# Patient Record
Sex: Female | Born: 1935
Health system: Southern US, Community
[De-identification: ages and names within clinical notes are randomized; demographics above are authoritative.]

## PROBLEM LIST (undated history)

## (undated) DIAGNOSIS — E785 Hyperlipidemia, unspecified: Secondary | ICD-10-CM

## (undated) DIAGNOSIS — Z8614 Personal history of Methicillin resistant Staphylococcus aureus infection: Secondary | ICD-10-CM

## (undated) DIAGNOSIS — N3281 Overactive bladder: Secondary | ICD-10-CM

## (undated) DIAGNOSIS — Z860100 Personal history of colon polyps, unspecified: Secondary | ICD-10-CM

## (undated) DIAGNOSIS — R0602 Shortness of breath: Secondary | ICD-10-CM

## (undated) DIAGNOSIS — M1990 Unspecified osteoarthritis, unspecified site: Secondary | ICD-10-CM

## (undated) DIAGNOSIS — IMO0002 Reserved for concepts with insufficient information to code with codable children: Secondary | ICD-10-CM

## (undated) DIAGNOSIS — M792 Neuralgia and neuritis, unspecified: Secondary | ICD-10-CM

## (undated) DIAGNOSIS — I10 Essential (primary) hypertension: Secondary | ICD-10-CM

## (undated) DIAGNOSIS — F0390 Unspecified dementia without behavioral disturbance: Secondary | ICD-10-CM

## (undated) DIAGNOSIS — F99 Mental disorder, not otherwise specified: Secondary | ICD-10-CM

## (undated) HISTORY — DX: Personal history of Methicillin resistant Staphylococcus aureus infection: Z86.14

## (undated) HISTORY — DX: Hyperlipidemia, unspecified: E78.5

## (undated) HISTORY — DX: Unspecified osteoarthritis, unspecified site: M19.90

## (undated) HISTORY — PX: EYE SURGERY: SHX253

## (undated) HISTORY — DX: Personal history of colon polyps, unspecified: Z86.0100

## (undated) HISTORY — PX: ABDOMINAL HYSTERECTOMY: SHX81

## (undated) HISTORY — DX: Essential (primary) hypertension: I10

## (undated) HISTORY — DX: Personal history of colonic polyps: Z86.010

## (undated) HISTORY — DX: Reserved for concepts with insufficient information to code with codable children: IMO0002

---

## 1998-05-31 ENCOUNTER — Ambulatory Visit (HOSPITAL_COMMUNITY): Admission: RE | Admit: 1998-05-31 | Discharge: 1998-05-31 | Payer: Self-pay | Admitting: Family Medicine

## 1998-06-28 ENCOUNTER — Encounter: Admission: RE | Admit: 1998-06-28 | Discharge: 1998-09-26 | Payer: Self-pay | Admitting: Family Medicine

## 2000-10-23 LAB — HM PAP SMEAR: HM Pap smear: NEGATIVE

## 2002-07-27 ENCOUNTER — Encounter (INDEPENDENT_AMBULATORY_CARE_PROVIDER_SITE_OTHER): Payer: Self-pay | Admitting: Specialist

## 2002-07-27 ENCOUNTER — Ambulatory Visit (HOSPITAL_COMMUNITY): Admission: RE | Admit: 2002-07-27 | Discharge: 2002-07-27 | Payer: Self-pay | Admitting: Gastroenterology

## 2002-07-27 LAB — HM COLONOSCOPY: HM Colonoscopy: NEGATIVE

## 2003-07-26 ENCOUNTER — Encounter: Admission: RE | Admit: 2003-07-26 | Discharge: 2003-10-24 | Payer: Self-pay | Admitting: Family Medicine

## 2005-03-10 HISTORY — PX: VAGINAL PROLAPSE REPAIR: SHX830

## 2005-08-15 ENCOUNTER — Ambulatory Visit: Payer: Self-pay | Admitting: Family Medicine

## 2005-10-27 ENCOUNTER — Inpatient Hospital Stay (HOSPITAL_COMMUNITY): Admission: RE | Admit: 2005-10-27 | Discharge: 2005-10-29 | Payer: Self-pay | Admitting: Obstetrics and Gynecology

## 2006-02-18 ENCOUNTER — Ambulatory Visit: Payer: Self-pay | Admitting: Family Medicine

## 2006-03-11 ENCOUNTER — Ambulatory Visit: Payer: Self-pay | Admitting: Family Medicine

## 2006-09-07 ENCOUNTER — Ambulatory Visit: Payer: Self-pay | Admitting: Family Medicine

## 2007-03-23 ENCOUNTER — Ambulatory Visit: Payer: Self-pay | Admitting: Family Medicine

## 2008-04-21 ENCOUNTER — Ambulatory Visit: Payer: Self-pay | Admitting: Family Medicine

## 2008-05-08 LAB — HM MAMMOGRAPHY

## 2008-05-12 ENCOUNTER — Ambulatory Visit: Payer: Self-pay | Admitting: Family Medicine

## 2008-06-12 ENCOUNTER — Ambulatory Visit: Payer: Self-pay | Admitting: Family Medicine

## 2008-09-18 ENCOUNTER — Ambulatory Visit: Payer: Self-pay | Admitting: Family Medicine

## 2008-12-25 ENCOUNTER — Ambulatory Visit: Payer: Self-pay | Admitting: Family Medicine

## 2009-01-04 ENCOUNTER — Ambulatory Visit: Payer: Self-pay | Admitting: Family Medicine

## 2009-01-22 ENCOUNTER — Ambulatory Visit: Payer: Self-pay | Admitting: Family Medicine

## 2009-04-30 ENCOUNTER — Ambulatory Visit: Payer: Self-pay | Admitting: Family Medicine

## 2009-08-27 ENCOUNTER — Ambulatory Visit: Payer: Self-pay | Admitting: Family Medicine

## 2009-08-31 ENCOUNTER — Ambulatory Visit: Payer: Self-pay | Admitting: Family Medicine

## 2009-09-03 ENCOUNTER — Ambulatory Visit: Payer: Self-pay | Admitting: Family Medicine

## 2009-09-12 ENCOUNTER — Ambulatory Visit: Payer: Self-pay | Admitting: Family Medicine

## 2010-06-28 ENCOUNTER — Encounter (INDEPENDENT_AMBULATORY_CARE_PROVIDER_SITE_OTHER): Payer: Medicare Other | Admitting: Family Medicine

## 2010-06-28 DIAGNOSIS — H3500 Unspecified background retinopathy: Secondary | ICD-10-CM

## 2010-06-28 DIAGNOSIS — Z0000 Encounter for general adult medical examination without abnormal findings: Secondary | ICD-10-CM

## 2010-06-28 DIAGNOSIS — E119 Type 2 diabetes mellitus without complications: Secondary | ICD-10-CM

## 2010-06-28 DIAGNOSIS — I10 Essential (primary) hypertension: Secondary | ICD-10-CM

## 2010-07-26 NOTE — Op Note (Signed)
NAME:  Kristin Cook, Kristin Cook                        ACCOUNT NO.:  [id number]   MEDICAL RECORD NO.:  [id number]                   PATIENT TYPE:  AMB   LOCATION:  ENDO                                 FACILITY:  [facility]   PHYSICIAN:  Anselmo Rod, M.D.               DATE OF BIRTH:  12-28-1931   DATE OF PROCEDURE:  07/27/2002  DATE OF DISCHARGE:                                 OPERATIVE REPORT   PROCEDURE:  Colonoscopy with biopsy.   ENDOSCOPIST:  Anselmo Rod, M.D.   INSTRUMENT USED:  Olympus video colonoscope.   INDICATIONS FOR PROCEDURE:  A 77 year old white female undergoing screening  colonoscopy to rule out colonic polyps, masses, etc.   PREPROCEDURE PREPARATION:  Informed consent was procured from the patient.  The patient fasted for eight hours prior to the procedure and prepped with a  bottle of magnesium citrate and a gallon of GoLYTELY the night prior to the  procedure.   PREPROCEDURE PHYSICAL:  The patient had stable vital signs. Neck supple.  Chest clear to auscultation. S1, S2 regular. Abdomen soft with normal bowel  sounds.   DESCRIPTION OF PROCEDURE:  The patient was placed in the left lateral  decubitus position and sedated with 40 mg of Demerol and 4 mg of Versed  intravenously. Once the patient was adequately sedated and maintained on low  flow oxygen and continuous cardiac monitoring, the Olympus video colonoscope  was advanced from the rectum to the cecum without difficulty.  A few  hyperplastic appearing small sessile polyps were seen in the rectum, these  were biopsied for pathology. The rest of the colonic mucosa up to the cecum  appeared normal. There was some residual stool in the colon, multiple  washings were done, small lesions could have been missed. There was no  evidence of diverticulosis. The patient tolerated the procedure well without  complications.   IMPRESSION:  1. Small sessile polyps biopsied from rectum, question of hyperplastic  polyps.  2. Some residual stool in the colon especially on the right side, small     lesions could have been missed.  3. No masses, polyp or diverticulosis noted.   RECOMMENDATIONS:  1. Await pathology results.  2. High fiber diet with liberal fluid intake has been advised.  3.     20-25 g of fiber has been recommended in the diet.  4. Repeat colorectal cancer screening depending on the pathology results.  5. Outpatient followup as needed.                                               Anselmo Rod, M.D.    JNM/MEDQ  D:  07/27/2002  T:  07/27/2002  Job:  [id number]   cc:   Sharlot Gowda, M.D.  [address]  [place], [place] [address]  Fax: [phone]

## 2010-07-26 NOTE — Discharge Summary (Signed)
Kristin Cook, Kristin Cook              ACCOUNT NO.:  [id number]   MEDICAL RECORD NO.:  [id number]          PATIENT TYPE:  INP   LOCATION:  9320                          [facility]ACILITY:  [facility]   PHYSICIAN:  Guy Sandifer. Henderson Cloud, M.D. DATE OF BIRTH:  09-29-1931   DATE OF ADMISSION:  10/27/2005  DATE OF DISCHARGE:  10/29/2005                                 DISCHARGE SUMMARY   ADMITTING DIAGNOSIS:  Pelvic relaxation.   DISCHARGE DIAGNOSIS:  Pelvic relaxation.   PROCEDURE:  On 10/27/05 anterior vaginal repair with Perigee graft and  posterior vaginal repair with Apogee graft.   REASON FOR ADMISSION:  This patient is a 77 year old married white female  G3, P3 who complains of symptoms of pelvic relaxation.  Details are dictated  in the history and physical.  She is admitted for surgical management.   HOSPITAL COURSE:  The patient is taken to operating room, undergoes the  above procedure.  On the evening of surgery she has good pain relief.  She  has tolerated some bites of a regular diet.  Vital signs are stable and she  is afebrile.  Urine output is clear.  Capillary glucose is less than 200.  Following day she is tolerating regular diet, passing flatus.  She is  ambulating but quite tired.  Vital signs are stable and she is afebrile.  The catheter had been removed and the patient was unable to void.  The Foley  catheter was replaced overnight.  Another trial at voiding that day was  undertaken.  She initially was voiding okay, but later in the day developed  retention.  The Foley was replaced.  She had some nausea that was treated  with Zofran.  On 10/29/2005 she is feeling better and passing flatus.  She  remains afebrile with stable vital signs.  Urine output is clear.   CONDITION ON DISCHARGE:  Good.   DIET:  Regular as tolerated.   ACTIVITY:  No lifting, no operation of automobiles, no vaginal entry.  She  is to call the office for problems including but not limited to temperature  of 101  degrees, heavy vaginal bleeding, persistent nausea, vomiting or  increasing pain.   MEDICATIONS:  Percocet 5/325 mg #30 one to two p.o. q.6 h p.r.n., Zofran 4  mg #10 one p.o. q.12 h p.r.n. no refills, Macrobid #10 one p.o. b.i.d. while  catheter is in place.   Instructions were reviewed with the patient.  Follow-up in the office as  instructed in the next 1-3 days.     Guy Sandifer Henderson Cloud, M.D.  Electronically Signed    JET/MEDQ  D:  11/26/2005  T:  11/27/2005  Job:  [id number]

## 2010-07-26 NOTE — Op Note (Signed)
Kristin Cook, Kristin Cook              ACCOUNT NO.:  [id number]   MEDICAL RECORD NO.:  [id number]          PATIENT TYPE:  AMB   LOCATION:  SDC                           [facility]   PHYSICIAN:  Guy Sandifer. Henderson Cloud, M.D. DATE OF BIRTH:  Feb 10, 1932   DATE OF PROCEDURE:  10/27/2005  DATE OF DISCHARGE:                                 OPERATIVE REPORT   PREOPERATIVE DIAGNOSIS:  Pelvic relaxation.   POSTOPERATIVE DIAGNOSIS:  Pelvic relaxation.   PROCEDURE:  Anterior repair with perigee graft, posterior repair with apogee  graft.   SURGEON:  Guy Sandifer. Henderson Cloud, M.D.   ASSISTANT:  Juluis Mire, M.D.   ANESTHESIA:  General endotracheal intubation.   ESTIMATED BLOOD LOSS:  200 mL.   INDICATIONS AND CONSENT:  This patient is 77 year old married white female  G3, P3 with symptoms of pelvic relaxation.  Details are dictated in the  history and physical.  Anterior-posterior repair of the grafts had been  discussed preoperatively.  Potential risks and complications were discussed  preoperatively including but limited to infection, bowel, bladder, ureteral  damage, bleeding requiring transfusion of blood products, possible  transfusion reaction, HIV and hepatitis acquisition, DVT, PE, pneumonia,  fistula formation, recurrent pelvic relaxation, erosion, dyspareunia, pelvic  pain, laparotomy.  All questions have been answered, and consent is signed  on the chart.   PROCEDURE:  The patient is taken to operating room where she is identified,  placed in dorsosupine position and general anesthesia is induced via  endotracheal intubation.  She is then placed in dorsal lithotomy position  where she is prepped, bladder straight catheterized, and she is draped in a  sterile fashion.  The anterior vaginal mucosa is injected with 1/2%  lidocaine with 1:200,000 epinephrine.  It is then taken down in the midline  to a point 2 cm above the cervix and approximately 3 cm below the urethral  meatus.   Dissection is then carried out bilaterally, sharply and bluntly.  Then, after careful palpation and additional local anesthesia, the same as  above, an incision is made bilaterally over the obturator fossa.  Then,  measuring down 3 cm and laterally 2 cm, second incision is also made on both  sides after injection with local anesthetic.  Then using the needle guides  supplied with perigee with the pink handles, needles are passed bilaterally  with careful palpation.  The pink take is then placed on the needles  bilaterally, and they are then withdrawn through the incisions.  Then, using  the gray needles, these are passed as well, exiting along the white line  bilaterally with additional tabs placed and again withdrawn.  The proximal  edge of the graft is sutured directly over the level of the cervix in the  midline.  Graft was positioned by withdrawing the arms, exiting through the  incisions.  It is then trimmed to fit and is found to be positioned flat.  Cystoscopy is then carried out.  There is no evidence of foreign body or  laceration to the bladder.  Good puff of urine is noted bilaterally from the  ureteral  orifices.  Cystoscope is withdrawn.  It should be noted that the  Foley catheter was in placed, and the bladder was drained prior to passing  any of the needles in the anterior space.  After proper positioning of the  graft, the anterior mucosa is reapproximated with running locking 2-0  Monocryl suture.  Attention is now turned posterior.  The posterior mucosa  is again injected with 0.50% lidocaine with 1:200,000 epinephrine.  Posterior mucosa is taken down to the midline.  Bilateral dissection is  carried out to the level of the ischial spine bilaterally.  Incision is made  3 cm inferior and lateral to the rectum bilaterally.  Using the needle  supplied with the apogee system, it is then passed with careful palpation as  well to a point just below the ischial spine and is passed  in front of the  ischial spine through the side wall bilaterally.  After each passage of the  needle, rectal exam is done and no damage to the rectum is palpated.  The  tabs of the apogee are then applied and are withdrawn through the incision.  A suture had been placed at the level of the cervix posteriorly and is now  sutured onto the center of the graft with 0 Monocryl.  After trimming to  fit, 0 Monocryl is also used to position the graft, one on each side, and  then one in the middle posterior perineal body.  Graft is seen to be lying  nice and flat.  Good support is noted.  The posterior cuff is closed in a  running locking fashion with 2-0 Monocryl suture.  Posterior perineal body  is reapproximated with 0 Monocryl pops, and the remainder of the 2-0  Monocryl is used to close the skin in standard episiotomy type fashion.  After further positioning of the graft, the sheath on the posterior graft is  also removed and trimmed as it is superiorly.  A 1-inch vaginal packing with  Estrace vaginal cream is placed.  Foley catheter is in place and drained to  the bag.  All counts correct.  The patient is awakened, taken to recovery  room in stable condition.      Guy Sandifer Henderson Cloud, M.D.  Electronically Signed     JET/MEDQ  D:  10/27/2005  T:  10/27/2005  Job:  [id number]

## 2010-07-26 NOTE — H&P (Signed)
Kristin Cook, Kristin Cook              ACCOUNT NO.:  [id number]   MEDICAL RECORD NO.:  [id number]          PATIENT TYPE:  AMB   LOCATION:  SDC                           [facility]   PHYSICIAN:  Guy Sandifer. Henderson Cloud, M.D. DATE OF BIRTH:  31-Aug-1931   DATE OF ADMISSION:  10/27/2005  DATE OF DISCHARGE:                                HISTORY & PHYSICAL   CHIEF COMPLAINT:  Pelvic relaxation.   HISTORY OF PRESENT ILLNESS:  This patient is a 77 year old, married, white  female, G3, P3 who feels as though something is bulging at the opening of  her vagina.  It is quite uncomfortable.  It gets in the way of intercourse.  She has no leaking of urine or urgency of her bladder. On examination, a  rectocele is readily noted at the vaginal introitus.  There is perhaps a  first degree cystocele with good uterine support.  Pelvic ultrasound in my  office on September 03, 2005 revealed a normal size uterus with normal-appearing  ovaries.  Endometrial stripe is 3 mm.  Her Pap smear is benign.  After  careful discussion of options, she is being admitted for anterior posterior  repair with grafts.   PAST MEDICAL HISTORY:  1. Diabetes.  2. High blood pressure.  3. Hyperlipidemia.   PAST SURGICAL HISTORY:  None.   OB HISTORY:  Vaginal delivery x3.   MEDICATIONS:  Metformin, Avandia, Vytorin, lisinopril.   ALLERGIES:  PENICILLIN leading to swelling.  Also leading to body pain.   FAMILY HISTORY:  Breast cancer and kidney cancer in sister.  Family history  of high blood pressure, coronary artery disease, multiple gestation and  birth defects.   REVIEW OF SYSTEMS:  NEUROLOGIC: Denies headache.  CARDIOVASCULAR:  Denies  chest pain.  PULMONARY:  Denies shortness of breath.  GI:  Denies recent  change in bowel habits.   PHYSICAL EXAMINATION:  VITAL SIGNS:  Height 5 feet 3 inches.  Blood pressure  122/70.  HEENT:  Without thyromegaly.  LUNGS:  Clear to auscultation.  HEART:  Regular rate and rhythm.  BACK:   Denies CVA tenderness.  BREASTS: Without masses, nipple retraction or discharge.  ABDOMEN:  Soft, nontender, without masses.  PELVIC:  Vulva, vaginal and cervix without lesions.  Rectocele is noted at  the vaginal introitus with minimal Valsalva.  Uterus is normal size and  mobile with fair support. First degree cystocele is noted.  Adnexa nontender  without masses.  Rectovaginal exam reveals good sphincter tone and a lax  rectovaginal septum.  EXTREMITIES:  Grossly within normal limits.  NEUROLOGIC:  Grossly within normal limits.   ASSESSMENT:  Pelvic relaxation.   PLAN:  Anterior posterior repair of grafts.      Guy Sandifer Henderson Cloud, M.D.  Electronically Signed     JET/MEDQ  D:  10/23/2005  T:  10/23/2005  Job:  [id number]

## 2010-09-12 ENCOUNTER — Encounter: Payer: Self-pay | Admitting: Medical

## 2010-09-12 ENCOUNTER — Ambulatory Visit (INDEPENDENT_AMBULATORY_CARE_PROVIDER_SITE_OTHER): Payer: Medicare Other | Admitting: Medical

## 2010-09-12 VITALS — BP 150/90 | HR 80 | Temp 99.0°F | Ht 63.0 in | Wt 172.0 lb

## 2010-09-12 DIAGNOSIS — L0231 Cutaneous abscess of buttock: Secondary | ICD-10-CM

## 2010-09-12 DIAGNOSIS — E119 Type 2 diabetes mellitus without complications: Secondary | ICD-10-CM

## 2010-09-12 MED ORDER — DOCUSATE SODIUM 100 MG PO CAPS: 100.0000 mg | ORAL_CAPSULE | Freq: Every day | ORAL | Status: AC | PRN

## 2010-09-12 MED ORDER — DOXYCYCLINE MONOHYDRATE 100 MG PO TABS: 100.0000 mg | ORAL_TABLET | Freq: Two times a day (BID) | ORAL | Status: AC

## 2010-09-12 MED ORDER — OXYCODONE-ACETAMINOPHEN 5-325 MG PO TABS: 1.0000 | ORAL_TABLET | ORAL | Status: AC | PRN

## 2010-09-12 NOTE — Progress Notes (Signed)
Subjective:     Kristin Cook is a 77 y.o. female who presents for evaluation of a probable cutaneous abscess. Lesion is located in the right sided buttock. Onset was 4 days ago. Symptoms have gradually worsened.  Abscess has associated symptoms of anorexia, nausea. Patient does have previous history of cutaneous abscesses - hx/o MRSA and prior I&D of abscess in the groin region. Patient does have diabetes.  Past Medical History  Diagnosis Date  . Diabetes mellitus   . Dyslipidemia   . Arthritis   . Hypertension    The following portions of the patient's history were reviewed and updated as appropriate: allergies, current medications, past family history, past medical history, past social history, past surgical history and problem list.    Objective:    There is an area characterized by erythema surrounding area measuring 10 cm, induration, fluctuance, tenderness measuring 5 cm in greatest dimension. Location: right sided buttock.  Procedure Informed consent obtained.  The area was prepped in the usual manner and the skin overlying the abscess was anesthetized with 5cc of 2% lidocaine with epi.  The area was sharply incised and approx 4 ccs of purulent material was obtained.  Area was irrigated with high pressure saline. Packing was inserted. Wound was covered with sterile bandage.      Assessment:     Encounter Diagnoses  Name Primary?  Marland Kitchen Abscess of buttock, right Yes  . Diabetes mellitus      Plan:   Patient Instructions  You were treated today for an abscess of the buttock.  We performed an incision and drainage.    Over the weekend I want you to take Doxycycline 100 mg tablet twice daily with food.  This is an antibiotic.   For pain you can either use OTC Tylenol, or if needed, I wrote a prescription for Percocet 5/325 mg which you can use every 4-6 hours as needed for pain.  This can cause some constipation, thus, I also wrote a prescription for Colace stool softener you  can use twice daily to help with constipation.    Use warm compresses to the area, but place a towel between the bandage and the warm compresses.    Don't change the dressing unless it is visibly soiled.  You can take off the current bandage, but be careful not to pull out the packing strip.   You can put several layers of sterile bandage such as 4x4 gauze pads on the area, and tape down with medical tape.    Return Monday first thing for packing removal and recheck.  If you are worse over the weekend, such as worse pain, fever over 101, very nauseated, etc, then call or go to the emergency dept.

## 2010-09-12 NOTE — Patient Instructions (Signed)
You were treated today for an abscess of the buttock.  We performed an incision and drainage.    Over the weekend I want you to take Doxycycline 100 mg tablet twice daily with food.  This is an antibiotic.   For pain you can either use OTC Tylenol, or if needed, I wrote a prescription for Percocet 5/325 mg which you can use every 4-6 hours as needed for pain.  This can cause some constipation, thus, I also wrote a prescription for Colace stool softener you can use twice daily to help with constipation.    Use warm compresses to the area, but place a towel between the bandage and the warm compresses.    Don't change the dressing unless it is visibly soiled.  You can take off the current bandage, but be careful not to pull out the packing strip.   You can put several layers of sterile bandage such as 4x4 gauze pads on the area, and tape down with medical tape.    Return Monday first thing for packing removal and recheck.  If you are worse over the weekend, such as worse pain, fever over 101, very nauseated, etc, then call or go to the emergency dept.

## 2010-09-13 ENCOUNTER — Encounter: Payer: Self-pay | Admitting: Family Medicine

## 2010-09-15 LAB — WOUND CULTURE: Gram Stain: NONE SEEN

## 2010-09-16 ENCOUNTER — Ambulatory Visit (INDEPENDENT_AMBULATORY_CARE_PROVIDER_SITE_OTHER): Payer: Medicare Other | Admitting: Medical

## 2010-09-16 ENCOUNTER — Encounter: Payer: Self-pay | Admitting: Medical

## 2010-09-16 VITALS — BP 130/80 | HR 80 | Temp 98.1°F | Ht 64.0 in | Wt 169.0 lb

## 2010-09-16 DIAGNOSIS — Z22322 Carrier or suspected carrier of Methicillin resistant Staphylococcus aureus: Secondary | ICD-10-CM

## 2010-09-16 DIAGNOSIS — L0231 Cutaneous abscess of buttock: Secondary | ICD-10-CM

## 2010-09-16 NOTE — Patient Instructions (Signed)
Continue epsom salt soaks, warm compresses to the area, use soap and water for cleaning, and finish the antibiotic.  You don't need to recheck on this unless it is getting worse or unless it is not completely healed by the time you finish antibiotics.

## 2010-09-16 NOTE — Progress Notes (Signed)
Subjective:     Kristin Cook is a 77 y.o. female who presents for recheck on abscess of the right buttock.  I saw her last Thursday initially for this, and the wound required I&D.  I placed her on Doxycyline at that point.  Today she feels much improved.  She has only had a little nausea, but no fever, and feels much better.  Taking Doxycycline without problems.  She has been changing the bandage due to soiling by drainage, however the packing fell out yesterday.  No other new c/o.   Past Medical History  Diagnosis Date  . Diabetes mellitus   . Dyslipidemia   . Arthritis   . Hypertension   . History of colonic polyps   . Cystocele   . Hx MRSA infection   . Cataract    The following portions of the patient's history were reviewed and updated as appropriate: allergies, current medications, past family history, past medical history, past social history, past surgical history and problem list.    Objective:    There is an area characterized by much less erythema than last week and a 5 cm diameter area of induration today.  Location: right sided buttock.    Assessment:     Encounter Diagnoses  Name Primary?  Marland Kitchen Abscess of buttock Yes  . MRSA (methicillin resistant staph aureus) culture positive      Plan:   Much improved s/p I&D and with Doxycycline.  Culture +MRSA sensitive to Tetracycline.  We cleaned the wound and covered with a new bandage.  No additional packing today.  Patient Instructions  Continue epsom salt soaks, warm compresses to the area, use soap and water for cleaning, and finish the antibiotic.  You don't need to recheck on this unless it is getting worse or unless it is not completely healed by the time you finish antibiotics.

## 2010-10-01 ENCOUNTER — Ambulatory Visit (INDEPENDENT_AMBULATORY_CARE_PROVIDER_SITE_OTHER): Payer: Medicare Other | Admitting: Medical

## 2010-10-01 ENCOUNTER — Encounter: Payer: Self-pay | Admitting: Medical

## 2010-10-01 VITALS — BP 142/92 | HR 72 | Temp 97.6°F | Ht 63.0 in | Wt 171.0 lb

## 2010-10-01 DIAGNOSIS — N8189 Other female genital prolapse: Secondary | ICD-10-CM

## 2010-10-01 DIAGNOSIS — R3911 Hesitancy of micturition: Secondary | ICD-10-CM

## 2010-10-01 DIAGNOSIS — Z1231 Encounter for screening mammogram for malignant neoplasm of breast: Secondary | ICD-10-CM

## 2010-10-01 LAB — POCT URINALYSIS DIPSTICK
Bilirubin, UA: NEGATIVE
Blood, UA: NEGATIVE
Glucose, UA: NEGATIVE
Ketones, UA: NEGATIVE
Leukocytes, UA: NEGATIVE
Nitrite, UA: NEGATIVE
Protein, UA: NEGATIVE
Spec Grav, UA: 1.01
Urobilinogen, UA: NEGATIVE
pH, UA: 5

## 2010-10-01 NOTE — Progress Notes (Signed)
Subjective:   HPI  Kristin Cook is a 77 y.o. female who presents for pelvic issues.  She notes for the last few weeks to months it feels like her genital region is falling out.  She notes hx/o same in past and had surgery in remote past for this.  Gets some itching occasionally, but the main problem is the tissue falling out.  This causes discomfort.  In general, she denies incontinence, denies pain with sexual activity.  She is sexually active.  She does report some urinary hesitancy.  Last mammogram, unknown, 1-2 years?  No other aggravating or relieving factors.  No other c/o.  The following portions of the patient's history were reviewed and updated as appropriate: allergies, current medications, past family history, past medical history, past social history, past surgical history and problem list.  Past Medical History  Diagnosis Date  . Diabetes mellitus   . Dyslipidemia   . Arthritis   . Hypertension   . History of colonic polyps   . Cystocele   . Hx MRSA infection   . Cataract    Past Surgical History  Procedure Date  . Vaginal prolapse repair 2007    Dr. Henderson Cloud    Review of Systems Constitutional: denies fever, chills, sweats, unexpected weight change Dermatology: denies rash Gastroenterology: denies abdominal pain, nausea, vomiting, diarrhea, constipation Urology: +urinary hesitancy, otherwise denies difficulty urinating, hematuria, urinary frequency, urgency GU: denies discharge, bleeding     Objective:   Physical Exam  General appearance: alert, no distress, WD/WN, white female Breasts:  Left breast just inferior to nipple with 2mm x 1mm rectangular brown flat macule uniform in color, there is a raised pink papule 4mm diameter beneath the right breast along the chest wall, benign appearing, and bilat breast without nodules or mass, no skin changes, no axillary adenopathy Abdomen: +bs, soft, non tender, non distended, no masses, no hepatomegaly, no  splenomegaly GU: the labia appear somewhat prominent compared to usual for this age group, no frank cystocele or uterine prolapse, there is some dense tissue along the pelvic floor anteriorly, possibly scar tissue from prior surgery, no obvious lump, no erythema or discharge, no swabs taken, anus without obvious external abnormality   Assessment :    Encounter Diagnoses  Name Primary?  . Pelvic relaxation Yes  . Urinary hesitancy   . Visit for screening mammogram      Plan:  Pelvic relaxation - She had pelvic relaxation repair with Dr. Harold Hedge 07/2005.  Given her current symptoms, will refer back to Dr. Henderson Cloud for recheck.  She had already made appt, but its not until September.  She is having quite a bit of discomfort, thus, we will call to try and get her back in to gyn earlier.    Urinary hesitancy - urinalysis today unremarkable.  We will go ahead and set her back up for screening mammogram.  Last mammogram on file is 05/2008.

## 2010-11-01 ENCOUNTER — Encounter: Payer: Self-pay | Admitting: Family Medicine

## 2010-11-05 ENCOUNTER — Ambulatory Visit (INDEPENDENT_AMBULATORY_CARE_PROVIDER_SITE_OTHER): Payer: Medicare Other | Admitting: Family Medicine

## 2010-11-05 ENCOUNTER — Encounter: Payer: Self-pay | Admitting: Family Medicine

## 2010-11-05 DIAGNOSIS — E1169 Type 2 diabetes mellitus with other specified complication: Secondary | ICD-10-CM | POA: Insufficient documentation

## 2010-11-05 DIAGNOSIS — Z23 Encounter for immunization: Secondary | ICD-10-CM

## 2010-11-05 DIAGNOSIS — E119 Type 2 diabetes mellitus without complications: Secondary | ICD-10-CM

## 2010-11-05 DIAGNOSIS — E118 Type 2 diabetes mellitus with unspecified complications: Secondary | ICD-10-CM | POA: Insufficient documentation

## 2010-11-05 DIAGNOSIS — E1159 Type 2 diabetes mellitus with other circulatory complications: Secondary | ICD-10-CM

## 2010-11-05 DIAGNOSIS — I152 Hypertension secondary to endocrine disorders: Secondary | ICD-10-CM

## 2010-11-05 DIAGNOSIS — E785 Hyperlipidemia, unspecified: Secondary | ICD-10-CM

## 2010-11-05 DIAGNOSIS — I10 Essential (primary) hypertension: Secondary | ICD-10-CM | POA: Insufficient documentation

## 2010-11-05 LAB — POCT GLYCOSYLATED HEMOGLOBIN (HGB A1C): Hemoglobin A1C: 7.7

## 2010-11-05 NOTE — Patient Instructions (Signed)
Continue on your present medications. See you in 4 months

## 2010-11-05 NOTE — Progress Notes (Signed)
  Subjective:    Patient ID: Kristin Cook, female    DOB: [birth year], 77 y.o.   MRN: [id number]  HPI She is here for recheck. She did see her gynecologist and is being referred to [facility] for robotic surgery. The lesion on her buttock is also healing nicely. She continues on medications listed in the chart. She does check her feet regularly and has had a recent eye exam. She walks daily. She does check her blood sugars.   Review of Systems     Objective:   Physical Exam Alert and in no distress. He will A1c 7.7       Assessment & Plan:  Diabetes. Hypertension. Hyperlipidemia. Encouraged her to make further diet and exercise changes to get her blood sugars a little bit lower. Continue on present medications and return here in 4 months.

## 2010-12-23 DIAGNOSIS — Z09 Encounter for follow-up examination after completed treatment for conditions other than malignant neoplasm: Secondary | ICD-10-CM | POA: Insufficient documentation

## 2011-01-23 ENCOUNTER — Ambulatory Visit (INDEPENDENT_AMBULATORY_CARE_PROVIDER_SITE_OTHER): Payer: Medicare Other | Admitting: Family Medicine

## 2011-01-23 ENCOUNTER — Encounter: Payer: Self-pay | Admitting: Family Medicine

## 2011-01-23 VITALS — BP 110/70 | HR 60 | Wt 159.0 lb

## 2011-01-23 DIAGNOSIS — L0292 Furuncle, unspecified: Secondary | ICD-10-CM

## 2011-01-23 DIAGNOSIS — L0293 Carbuncle, unspecified: Secondary | ICD-10-CM

## 2011-01-23 MED ORDER — DOXYCYCLINE HYCLATE 100 MG PO TABS: 100.0000 mg | ORAL_TABLET | Freq: Two times a day (BID) | ORAL | Status: AC

## 2011-01-23 NOTE — Progress Notes (Signed)
  Subjective:    Patient ID: Kristin Cook, female    DOB: [birth year], 77 y.o.   MRN: [id number]  HPI She is here for evaluation of multiple painful lesions. She's had difficulty with this in the past and was placed on antibiotics however they have recurred.  Review of Systems     Objective:   Physical Exam Alert and in no distress. Several round red tender lesions are noted specifically in the perianal area, pubic in the left axilla does       Assessment & Plan:   1. Recurrent boils    I will place her on doxycycline. Recommend heat to the area 20 minutes 3 times per day. Also discussed the use of antibacterial soaps with her. She will call if any these open up and starts to drain for more definitive care

## 2011-01-23 NOTE — Patient Instructions (Addendum)
Use heat on all of the areas 20 minutes 3 times per day. Take all the antibiotic and hopefully these things will shrink; if they do open up then come back and let me work on it. Use Dial soap or Lever 2000

## 2011-02-03 ENCOUNTER — Ambulatory Visit (INDEPENDENT_AMBULATORY_CARE_PROVIDER_SITE_OTHER): Payer: Medicare Other | Admitting: Medical

## 2011-02-03 ENCOUNTER — Encounter: Payer: Self-pay | Admitting: Medical

## 2011-02-03 VITALS — BP 150/78 | HR 62 | Temp 98.2°F | Resp 16 | Wt 158.0 lb

## 2011-02-03 DIAGNOSIS — N760 Acute vaginitis: Secondary | ICD-10-CM

## 2011-02-03 MED ORDER — NYSTATIN 100000 UNIT/GM EX CREA: TOPICAL_CREAM | Freq: Two times a day (BID) | CUTANEOUS | Status: AC

## 2011-02-03 NOTE — Progress Notes (Signed)
Subjective:   HPI  Kristin Cook is a 77 y.o. female who presents with few day hx/o pain on the inside of her vagina.   She reports having a hysterectomy within the past few months.  She notes in the last few days she feels pain inside her vagina.  Denies discharge, odor, itching.  There is some redness thought.  She gets this off and on.  She notes hx/o yeast infection in remote past.  Denies urinary symptoms.  She does have history significant for recurrent boils and abscess, +MRSA in the past.  No other aggravating or relieving factors.    No other c/o.  The following portions of the patient's history were reviewed and updated as appropriate: allergies, current medications, past family history, past medical history, past social history, past surgical history and problem list.  Past Medical History  Diagnosis Date  . Diabetes mellitus   . Dyslipidemia   . Arthritis   . Hypertension   . History of colonic polyps   . Cystocele   . Hx MRSA infection   . Cataract    Review of Systems Constitutional: -fever, -chills, -sweats, -unexpected -weight change,-fatigue ENT: -runny nose, -ear pain, -sore throat Cardiology:  -chest pain, -palpitations, -edema Respiratory: -cough, -shortness of breath, -wheezing Gastroenterology: -abdominal pain, -nausea, -vomiting, -diarrhea, -constipation Hematology: -bleeding or bruising problems Musculoskeletal: -arthralgias, -myalgias, -joint swelling, -back pain Ophthalmology: -vision changes Urology: -dysuria, -difficulty urinating, -hematuria, -urinary frequency, -urgency Neurology: -headache, -weakness, -tingling, -numbness      Objective:   Physical Exam  Filed Vitals:   02/03/11 1142  BP: 150/78  Pulse: 62  Temp: 98.2 F (36.8 C)  Resp: 16    General appearance: alert, no distress, WD/WN Abdomen: +bs, soft, non tender, non distended, no masses, no hepatomegaly, no splenomegaly GU: labia major with erythema, some swelling, and there is a  mostly healed slightly indurated lesion of the right inguinal area (recent cellulitis), speculum exam reveals no discharge, pink/pale mucosa, but no other lesions, cervix is intact and no lesions or discharge, swab taken.  Exam chaperoned by nurse.   Assessment and Plan :     Encounter Diagnosis  Name Primary?  . Vulvovaginitis Yes   Wet prep with scattered yeast, bacteria, few epithelia.  Advised that exam suggest vulvovaginitis.  Will treat with topically Nystatin.  Advised if not improving by end of the week, to call/return.  If no improvement, consider other etiology such as cellulitis, diabetic vulvovaginits or other.  Follow-up 4-5 days.

## 2011-04-24 ENCOUNTER — Ambulatory Visit (INDEPENDENT_AMBULATORY_CARE_PROVIDER_SITE_OTHER): Payer: Medicare Other | Admitting: Family Medicine

## 2011-04-24 ENCOUNTER — Encounter: Payer: Self-pay | Admitting: Family Medicine

## 2011-04-24 DIAGNOSIS — E785 Hyperlipidemia, unspecified: Secondary | ICD-10-CM

## 2011-04-24 DIAGNOSIS — E1169 Type 2 diabetes mellitus with other specified complication: Secondary | ICD-10-CM

## 2011-04-24 DIAGNOSIS — E119 Type 2 diabetes mellitus without complications: Secondary | ICD-10-CM

## 2011-04-24 DIAGNOSIS — N390 Urinary tract infection, site not specified: Secondary | ICD-10-CM

## 2011-04-24 DIAGNOSIS — I10 Essential (primary) hypertension: Secondary | ICD-10-CM

## 2011-04-24 DIAGNOSIS — I152 Hypertension secondary to endocrine disorders: Secondary | ICD-10-CM

## 2011-04-24 DIAGNOSIS — E1159 Type 2 diabetes mellitus with other circulatory complications: Secondary | ICD-10-CM

## 2011-04-24 LAB — POCT UA - MICROALBUMIN
Albumin/Creatinine Ratio, Urine, POC: 47.1
Creatinine, POC: 87.2 mg/dL
Microalbumin Ur, POC: 41.1 mg/dL

## 2011-04-24 LAB — POCT URINALYSIS DIPSTICK
Bilirubin, UA: NEGATIVE
Glucose, UA: NEGATIVE
Ketones, UA: NEGATIVE
Nitrite, UA: NEGATIVE
Spec Grav, UA: 1.015
Urobilinogen, UA: NEGATIVE
pH, UA: 6

## 2011-04-24 LAB — POCT GLYCOSYLATED HEMOGLOBIN (HGB A1C): Hemoglobin A1C: 7.9

## 2011-04-24 MED ORDER — CIPROFLOXACIN HCL 500 MG PO TABS: 500.0000 mg | ORAL_TABLET | Freq: Two times a day (BID) | ORAL | Status: AC

## 2011-04-24 NOTE — Patient Instructions (Signed)
Increase your physical activity and make further changes in your eating habits. I will see you in 3 months

## 2011-04-24 NOTE — Progress Notes (Signed)
  Subjective:    Patient ID: Kristin Cook, female    DOB: 24-May-1931, 78 y.o.   MRN: [id number]  HPI She does complain of urinary frequency and urgency as well as nocturnal urge incontinence She continues on medications listed in the chart. She does not smoke or drink. She is in a 7 year relationship which is going quite well. She admits to having some dietary indiscretion. She does get regular eye exams. She has no other concerns or complaints.  Review of Systems     Objective:   Physical Exam Alert and in no distress. Hemoglobin A1c is 7.9.       Assessment & Plan:   1. Diabetes mellitus  POCT HgB A1C, POCT Urinalysis Dipstick, POCT UA - Microalbumin  2. UTI (lower urinary tract infection)  ciprofloxacin (CIPRO) 500 MG tablet  3. Hyperlipidemia LDL goal < 70    4. Hypertension associated with diabetes     again discussed the need for her to get more physically active and make further changes especially in carbohydrate consumption.

## 2011-04-28 ENCOUNTER — Telehealth: Payer: Self-pay | Admitting: Internal Medicine

## 2011-04-28 MED ORDER — ROSUVASTATIN CALCIUM 20 MG PO TABS: 20.0000 mg | ORAL_TABLET | Freq: Every day | ORAL | Status: DC

## 2011-04-28 MED ORDER — LISINOPRIL-HYDROCHLOROTHIAZIDE 20-12.5 MG PO TABS: 1.0000 | ORAL_TABLET | Freq: Every day | ORAL | Status: DC

## 2011-04-28 MED ORDER — AMLODIPINE BESYLATE 5 MG PO TABS: 5.0000 mg | ORAL_TABLET | Freq: Every day | ORAL | Status: DC

## 2011-04-28 MED ORDER — PIOGLITAZONE HCL-METFORMIN HCL 15-850 MG PO TABS: 1.0000 | ORAL_TABLET | Freq: Two times a day (BID) | ORAL | Status: DC

## 2011-04-28 NOTE — Telephone Encounter (Signed)
Sent med in per pt request 

## 2011-04-28 NOTE — Telephone Encounter (Signed)
Sent meds in

## 2011-09-15 ENCOUNTER — Telehealth: Payer: Self-pay | Admitting: Family Medicine

## 2011-09-15 MED ORDER — PIOGLITAZONE HCL-METFORMIN HCL 15-850 MG PO TABS: 1.0000 | ORAL_TABLET | Freq: Two times a day (BID) | ORAL | Status: DC

## 2011-09-15 NOTE — Telephone Encounter (Signed)
SENT IN MED PT HAS A PE SCHEDUALED

## 2011-09-29 ENCOUNTER — Encounter: Payer: Self-pay | Admitting: Family Medicine

## 2011-09-29 ENCOUNTER — Ambulatory Visit (INDEPENDENT_AMBULATORY_CARE_PROVIDER_SITE_OTHER): Payer: Medicare Other | Admitting: Family Medicine

## 2011-09-29 ENCOUNTER — Other Ambulatory Visit: Payer: Self-pay

## 2011-09-29 VITALS — BP 122/72 | HR 70 | Ht 63.0 in | Wt 167.0 lb

## 2011-09-29 DIAGNOSIS — E785 Hyperlipidemia, unspecified: Secondary | ICD-10-CM

## 2011-09-29 DIAGNOSIS — Z0000 Encounter for general adult medical examination without abnormal findings: Secondary | ICD-10-CM

## 2011-09-29 DIAGNOSIS — I152 Hypertension secondary to endocrine disorders: Secondary | ICD-10-CM

## 2011-09-29 DIAGNOSIS — E1169 Type 2 diabetes mellitus with other specified complication: Secondary | ICD-10-CM

## 2011-09-29 DIAGNOSIS — M1990 Unspecified osteoarthritis, unspecified site: Secondary | ICD-10-CM | POA: Insufficient documentation

## 2011-09-29 DIAGNOSIS — M129 Arthropathy, unspecified: Secondary | ICD-10-CM

## 2011-09-29 DIAGNOSIS — I10 Essential (primary) hypertension: Secondary | ICD-10-CM

## 2011-09-29 DIAGNOSIS — K5900 Constipation, unspecified: Secondary | ICD-10-CM

## 2011-09-29 DIAGNOSIS — E1159 Type 2 diabetes mellitus with other circulatory complications: Secondary | ICD-10-CM

## 2011-09-29 DIAGNOSIS — E119 Type 2 diabetes mellitus without complications: Secondary | ICD-10-CM

## 2011-09-29 LAB — POCT URINALYSIS DIPSTICK
Bilirubin, UA: NEGATIVE
Blood, UA: NEGATIVE
Glucose, UA: NEGATIVE
Ketones, UA: NEGATIVE
Leukocytes, UA: NEGATIVE
Nitrite, UA: NEGATIVE
Protein, UA: NEGATIVE
Spec Grav, UA: 1.02
Urobilinogen, UA: NEGATIVE
pH, UA: 5

## 2011-09-29 LAB — POCT GLYCOSYLATED HEMOGLOBIN (HGB A1C): Hemoglobin A1C: 8

## 2011-09-29 NOTE — Patient Instructions (Signed)

## 2011-09-29 NOTE — Progress Notes (Signed)
  Subjective:    Patient ID: Kristin Cook, female    DOB: [birth year], 78 y.o.   MRN: [id number]  HPI She is here for an interval evaluation. She continues to check her blood sugars and they run usually under 160. She has had an eye exam in the last year. She does check her feet. She does not smoke or drink. Her physical activity is somewhat limited. Her arthritis does occasionally bother her. She is having difficulty recently with constipation. Anoscopy 2004. His DEXA scan and is up-to-date on her immunizations. In general her life is going very well. She does have a female companion in this relationship is going quite well.   Review of Systems Negative except as above    Objective:   Physical Exam BP 122/72  Pulse 70  Ht 5\' 3"  (1.6 m)  Wt 167 lb (75.751 kg)  BMI 29.58 kg/m2  SpO2 96%  General Appearance:    Alert, cooperative, no distress, appears stated age  Head:    Normocephalic, without obvious abnormality, atraumatic  Eyes:    PERRL, conjunctiva/corneas clear, EOM's intact, fundi    benign  Ears:    Normal TM's and external ear canals  Nose:   Nares normal, mucosa normal, no drainage or sinus   tenderness  Throat:   Lips, mucosa, and tongue normal; teeth and gums normal  Neck:   Supple, no lymphadenopathy;  thyroid:  no   enlargement/tenderness/nodules; no carotid   bruit or JVD  Back:    Spine nontender, no curvature, ROM normal, no CVA     tenderness  Lungs:     Clear to auscultation bilaterally without wheezes, rales or     ronchi; respirations unlabored  Chest Wall:    No tenderness or deformity   Heart:    Regular rate and rhythm, S1 and S2 normal, no murmur, rub   or gallop  Breast Exam:    Deferred to GYN  Abdomen:     Soft, non-tender, nondistended, normoactive bowel sounds,    no masses, no hepatosplenomegaly  Genitalia:    Deferred to GYN     Extremities:   No clubbing, cyanosis or edema  Pulses:   2+ and symmetric all extremities  Skin:   Skin color, texture,  turgor normal, no rashes or lesions  Lymph nodes:   Cervical, supraclavicular, and axillary nodes normal  Neurologic:   CNII-XII intact, normal strength, sensation and gait; reflexes 2+ and symmetric throughout          Psych:   Normal mood, affect, hygiene and grooming.           Assessment & Plan:   1. Type II or unspecified type diabetes mellitus without mention of complication, not stated as uncontrolled  POCT glycosylated hemoglobin (Hb A1C)  2. Routine general medical examination at a health care facility  POCT Urinalysis Dipstick  3. Hyperlipidemia LDL goal < 70    4. Hypertension associated with diabetes    5. Constipation    6. Arthritis     discussed wellness with her in regard to her physical activities and psychological stressors. Her left right now is going quite well. Information was given to her concerning end-of-life issues and she was given information on MOST. I encouraged her to continue to take good care of herself.

## 2011-09-30 ENCOUNTER — Other Ambulatory Visit: Payer: Medicare Other

## 2011-09-30 DIAGNOSIS — Z0000 Encounter for general adult medical examination without abnormal findings: Secondary | ICD-10-CM

## 2011-09-30 DIAGNOSIS — E785 Hyperlipidemia, unspecified: Secondary | ICD-10-CM

## 2011-09-30 LAB — CBC WITH DIFFERENTIAL/PLATELET
Basophils Absolute: 0.1 10*3/uL (ref 0.0–0.1)
Basophils Relative: 1 % (ref 0–1)
Eosinophils Absolute: 0.5 10*3/uL (ref 0.0–0.7)
Eosinophils Relative: 7 % — ABNORMAL HIGH (ref 0–5)
HCT: 36.5 % (ref 36.0–46.0)
Hemoglobin: 12.9 g/dL (ref 12.0–15.0)
Lymphocytes Relative: 28 % (ref 12–46)
Lymphs Abs: 1.8 10*3/uL (ref 0.7–4.0)
MCH: 31.1 pg (ref 26.0–34.0)
MCHC: 35.3 g/dL (ref 30.0–36.0)
MCV: 88 fL (ref 78.0–100.0)
Monocytes Absolute: 0.7 10*3/uL (ref 0.1–1.0)
Monocytes Relative: 10 % (ref 3–12)
Neutro Abs: 3.6 10*3/uL (ref 1.7–7.7)
Neutrophils Relative %: 54 % (ref 43–77)
Platelets: 187 10*3/uL (ref 150–400)
RBC: 4.15 MIL/uL (ref 3.87–5.11)
RDW: 13.2 % (ref 11.5–15.5)
WBC: 6.6 10*3/uL (ref 4.0–10.5)

## 2011-10-01 ENCOUNTER — Other Ambulatory Visit: Payer: Self-pay

## 2011-10-01 LAB — COMPREHENSIVE METABOLIC PANEL
ALT: 13 U/L (ref 0–35)
AST: 18 U/L (ref 0–37)
Albumin: 4.4 g/dL (ref 3.5–5.2)
Alkaline Phosphatase: 57 U/L (ref 39–117)
BUN: 10 mg/dL (ref 6–23)
CO2: 28 mEq/L (ref 19–32)
Calcium: 9.2 mg/dL (ref 8.4–10.5)
Chloride: 96 mEq/L (ref 96–112)
Creat: 0.7 mg/dL (ref 0.50–1.10)
Glucose, Bld: 189 mg/dL — ABNORMAL HIGH (ref 70–99)
Potassium: 4.1 mEq/L (ref 3.5–5.3)
Sodium: 133 mEq/L — ABNORMAL LOW (ref 135–145)
Total Bilirubin: 0.5 mg/dL (ref 0.3–1.2)
Total Protein: 6.5 g/dL (ref 6.0–8.3)

## 2011-10-01 LAB — LIPID PANEL
Cholesterol: 129 mg/dL (ref 0–200)
HDL: 67 mg/dL
LDL Cholesterol: 49 mg/dL (ref 0–99)
Total CHOL/HDL Ratio: 1.9 ratio
Triglycerides: 65 mg/dL
VLDL: 13 mg/dL (ref 0–40)

## 2011-10-01 MED ORDER — ROSUVASTATIN CALCIUM 20 MG PO TABS: 20.0000 mg | ORAL_TABLET | Freq: Every day | ORAL | Status: DC

## 2011-10-01 NOTE — Progress Notes (Signed)
Quick Note:  The blood work is normal ______ 

## 2011-10-01 NOTE — Progress Notes (Signed)
Pt informed

## 2011-12-05 ENCOUNTER — Other Ambulatory Visit: Payer: Self-pay | Admitting: Family Medicine

## 2011-12-30 ENCOUNTER — Encounter: Payer: Self-pay | Admitting: Internal Medicine

## 2011-12-30 DIAGNOSIS — E113412 Type 2 diabetes mellitus with severe nonproliferative diabetic retinopathy with macular edema, left eye: Secondary | ICD-10-CM | POA: Insufficient documentation

## 2011-12-30 DIAGNOSIS — E11339 Type 2 diabetes mellitus with moderate nonproliferative diabetic retinopathy without macular edema: Secondary | ICD-10-CM

## 2012-02-13 ENCOUNTER — Encounter: Payer: Self-pay | Admitting: Medical

## 2012-02-13 ENCOUNTER — Ambulatory Visit (INDEPENDENT_AMBULATORY_CARE_PROVIDER_SITE_OTHER): Payer: Medicare Other | Admitting: Medical

## 2012-02-13 VITALS — BP 130/82 | HR 74 | Temp 98.2°F | Resp 16 | Wt 160.0 lb

## 2012-02-13 DIAGNOSIS — E785 Hyperlipidemia, unspecified: Secondary | ICD-10-CM

## 2012-02-13 DIAGNOSIS — E119 Type 2 diabetes mellitus without complications: Secondary | ICD-10-CM

## 2012-02-13 DIAGNOSIS — I10 Essential (primary) hypertension: Secondary | ICD-10-CM

## 2012-02-13 LAB — POCT GLYCOSYLATED HEMOGLOBIN (HGB A1C): Hemoglobin A1C: 6.8

## 2012-02-14 ENCOUNTER — Encounter: Payer: Self-pay | Admitting: Medical

## 2012-02-14 NOTE — Progress Notes (Signed)
  Subjective:    Patient ID: Kristin Cook, female    DOB: 12/22/1931, 78 y.o.   MRN: [id number]  Here for routine recheck on diabetes, cholesterol, blood pressure, medications.  She is compliant with medications.  Since last visit she has lost some weight, walks regularly, has been eating better. . She says she knows the foods that can raise her sugar, and has really limited problem foods.  She does have eye problems, and is followed by her regular eye doctor as well as a diabetic eye specialist her doctor referred her to.  She has no new c/o.  She does check feet daily.   She has had some pains in the bottom of her feet at times, sometimes numb feeling, but no burning feeling.  She declines podiatry referral or diabetic shoes today.  No other c/o.  Review of Systems unremarkable     Objective:   Physical Exam  Gen: wd, wn, Alert and in no distress.  Skin: toenails with some thickening, cut down quite a bit, some thickened callous on right volar ball of foot and bilat 5th toes, no open wounds Neuro: normal foot sensation Pulses normal Heart: RRR, normal S1, S2, no murmurs Lungs: CTA      Assessment & Plan:   Encounter Diagnoses  Name Primary?  . Type II or unspecified type diabetes mellitus without mention of complication, not stated as uncontrolled Yes  . Hyperlipidemia   . Essential hypertension, benign    DM type II - HgbA1C 6.8% today, significant improvement.  Congratulated her on her improvements in diet and exercise.  C/t her efforts.  C/t current medication, daily foot checks, yearly eye doctor f/u, healthy diet, exercise, and recheck in 28mo  Hyperlipidemia - reviewed prior 2 years of labs that have been at goal.  No additional labs today.  Liver tests have been normal.  HTN - controlled on current medication.  F/u 28mo.

## 2012-02-17 ENCOUNTER — Telehealth: Payer: Self-pay | Admitting: Medical

## 2012-02-17 MED ORDER — PIOGLITAZONE HCL-METFORMIN HCL 15-850 MG PO TABS: 1.0000 | ORAL_TABLET | Freq: Two times a day (BID) | ORAL | Status: DC

## 2012-02-17 NOTE — Telephone Encounter (Signed)
RX refill was sent into the pharmacy. CLS

## 2012-06-28 ENCOUNTER — Encounter: Payer: Self-pay | Admitting: Family Medicine

## 2012-06-29 ENCOUNTER — Encounter: Payer: Self-pay | Admitting: Family Medicine

## 2012-07-06 ENCOUNTER — Telehealth: Payer: Self-pay | Admitting: Internal Medicine

## 2012-07-06 ENCOUNTER — Encounter: Payer: Self-pay | Admitting: Family Medicine

## 2012-07-06 ENCOUNTER — Ambulatory Visit (INDEPENDENT_AMBULATORY_CARE_PROVIDER_SITE_OTHER): Payer: Medicare Other | Admitting: Family Medicine

## 2012-07-06 VITALS — BP 140/90 | HR 84 | Wt 168.0 lb

## 2012-07-06 DIAGNOSIS — R609 Edema, unspecified: Secondary | ICD-10-CM

## 2012-07-06 MED ORDER — ROSUVASTATIN CALCIUM 20 MG PO TABS: 20.0000 mg | ORAL_TABLET | Freq: Every day | ORAL | Status: DC

## 2012-07-06 NOTE — Patient Instructions (Signed)
When you're sitting elevate your feet as much is possible. Use support hose.

## 2012-07-06 NOTE — Progress Notes (Signed)
  Subjective:    Patient ID: Kristin Cook, female    DOB: 1931/09/17, 79 y.o.   MRN: [id number]  HPI She has a three-week history of bilateral lower leg swelling and foot discomfort after walking and some tingling in the left lateral knee area. No chest pain, SOB, PND or orthopnea. It gets worse as the day goes on and does improve when she elevates her feet. She checks her feet regularly and has noted no lesions. She has been doing foot massage and wearing comfortable socks which does help.   Review of Systems     Objective:   Physical Exam Alert and in no distress. Cardiac exam shows regular rhythm without murmurs or gallops. Lungs are clear to auscultation. Lower extremities show normal sensation and pulses. Homans sign is negative. 2+ pitting edema is noted above the sock line       Assessment & Plan:  Dependent edema recommend keeping her feet elevated as much is possible and also getting more firm support stockings to help with this.

## 2012-07-06 NOTE — Telephone Encounter (Signed)
Pt needed crestor sent in

## 2012-07-30 LAB — HM DIABETES EYE EXAM

## 2012-08-05 ENCOUNTER — Ambulatory Visit (INDEPENDENT_AMBULATORY_CARE_PROVIDER_SITE_OTHER): Payer: Medicare Other | Admitting: Family Medicine

## 2012-08-05 ENCOUNTER — Encounter: Payer: Self-pay | Admitting: Family Medicine

## 2012-08-05 VITALS — BP 140/80 | HR 76 | Ht 63.0 in | Wt 166.0 lb

## 2012-08-05 DIAGNOSIS — Z0000 Encounter for general adult medical examination without abnormal findings: Secondary | ICD-10-CM

## 2012-08-05 DIAGNOSIS — Z23 Encounter for immunization: Secondary | ICD-10-CM

## 2012-08-05 DIAGNOSIS — E1159 Type 2 diabetes mellitus with other circulatory complications: Secondary | ICD-10-CM

## 2012-08-05 DIAGNOSIS — E785 Hyperlipidemia, unspecified: Secondary | ICD-10-CM

## 2012-08-05 DIAGNOSIS — M129 Arthropathy, unspecified: Secondary | ICD-10-CM

## 2012-08-05 DIAGNOSIS — M1990 Unspecified osteoarthritis, unspecified site: Secondary | ICD-10-CM

## 2012-08-05 DIAGNOSIS — E11339 Type 2 diabetes mellitus with moderate nonproliferative diabetic retinopathy without macular edema: Secondary | ICD-10-CM

## 2012-08-05 DIAGNOSIS — I10 Essential (primary) hypertension: Secondary | ICD-10-CM

## 2012-08-05 DIAGNOSIS — I152 Hypertension secondary to endocrine disorders: Secondary | ICD-10-CM

## 2012-08-05 DIAGNOSIS — E1169 Type 2 diabetes mellitus with other specified complication: Secondary | ICD-10-CM

## 2012-08-05 DIAGNOSIS — E119 Type 2 diabetes mellitus without complications: Secondary | ICD-10-CM

## 2012-08-05 DIAGNOSIS — Z79899 Other long term (current) drug therapy: Secondary | ICD-10-CM

## 2012-08-05 LAB — CBC WITH DIFFERENTIAL/PLATELET
Basophils Absolute: 0 10*3/uL (ref 0.0–0.1)
Basophils Relative: 1 % (ref 0–1)
Eosinophils Absolute: 0.2 10*3/uL (ref 0.0–0.7)
Eosinophils Relative: 4 % (ref 0–5)
HCT: 39.2 % (ref 36.0–46.0)
Hemoglobin: 13.4 g/dL (ref 12.0–15.0)
Lymphocytes Relative: 22 % (ref 12–46)
Lymphs Abs: 1.4 10*3/uL (ref 0.7–4.0)
MCH: 30.8 pg (ref 26.0–34.0)
MCHC: 34.2 g/dL (ref 30.0–36.0)
MCV: 90.1 fL (ref 78.0–100.0)
Monocytes Absolute: 0.4 10*3/uL (ref 0.1–1.0)
Monocytes Relative: 6 % (ref 3–12)
Neutro Abs: 4.4 10*3/uL (ref 1.7–7.7)
Neutrophils Relative %: 67 % (ref 43–77)
Platelets: 198 10*3/uL (ref 150–400)
RBC: 4.35 MIL/uL (ref 3.87–5.11)
RDW: 13.7 % (ref 11.5–15.5)
WBC: 6.5 10*3/uL (ref 4.0–10.5)

## 2012-08-05 LAB — POCT UA - MICROALBUMIN
Albumin/Creatinine Ratio, Urine, POC: 53.9
Creatinine, POC: 48.2 mg/dL
Microalbumin Ur, POC: 26 mg/dL

## 2012-08-05 LAB — POCT URINALYSIS DIPSTICK
Bilirubin, UA: NEGATIVE
Blood, UA: NEGATIVE
Glucose, UA: 100
Ketones, UA: NEGATIVE
Leukocytes, UA: NEGATIVE
Nitrite, UA: NEGATIVE
Protein, UA: NEGATIVE
Spec Grav, UA: 1.01
Urobilinogen, UA: NEGATIVE
pH, UA: 7

## 2012-08-05 LAB — POCT GLYCOSYLATED HEMOGLOBIN (HGB A1C): Hemoglobin A1C: 7.3

## 2012-08-05 MED ORDER — PIOGLITAZONE HCL-METFORMIN HCL 15-850 MG PO TABS: 1.0000 | ORAL_TABLET | Freq: Two times a day (BID) | ORAL | Status: DC

## 2012-08-05 MED ORDER — AMLODIPINE BESYLATE 5 MG PO TABS: 5.0000 mg | ORAL_TABLET | Freq: Every day | ORAL | Status: DC

## 2012-08-05 MED ORDER — ROSUVASTATIN CALCIUM 20 MG PO TABS: 20.0000 mg | ORAL_TABLET | Freq: Every day | ORAL | Status: DC

## 2012-08-05 MED ORDER — LISINOPRIL-HYDROCHLOROTHIAZIDE 20-12.5 MG PO TABS: 1.0000 | ORAL_TABLET | Freq: Every day | ORAL | Status: DC

## 2012-08-05 NOTE — Patient Instructions (Signed)
Start checking your blood sugars 2 hours after meals to give you immediate feedback

## 2012-08-05 NOTE — Progress Notes (Signed)
Subjective:    Kristin Cook is a 79 y.o. female who presents for follow-up of Type 2 diabetes mellitus.    Home blood sugar records: 140's  Current symptoms/problems stabbing pains in legs that are not related to his ago activity, salt intake. She also states that she has some swelling that does tend to go away when she raises her legs. Daily foot checks, foot concerns: yes Last eye exam:  01/13/12.she was seen yesterday and had a procedure performed   Medication compliance:adequate Current diet:400 cal. Per meal Current exercise: walking some  Known diabetic complications: retinopathy Cardiovascular risk factors: advanced age (older than 46 for men, 69 for women), diabetes mellitus, dyslipidemia, hypertension and sedentary lifestyle   The following portions of the patient's history were reviewed and updated as appropriate: allergies, current medications, past family history, past medical history, past social history, past surgical history and problem list. She lives in a basement apartment with her son living above her. She also has a companion and the 2 of them and do a lot of things together. Her arthritis is not giving her much difficulty. ROS as in subjective above    Objective:   General appearence: alert, no distress, WD/WN Neck: supple, no lymphadenopathy, no thyromegaly, no masses Heart: RRR, normal S1, S2, no murmurs Lungs: CTA bilaterally, no wheezes, rhonchi, or rales Abdomen: +bs, soft, non tender, non distended, no masses, no hepatomegaly, no splenomegaly Pulses: 2+ symmetric, upper and lower extremities, normal cap refill Ext: no edema Foot exam:  Neuro: foot monofilament exam normal   Lab Review Lab Results  Component Value Date   HGBA1C 6.8 02/13/2012   Lab Results  Component Value Date   CHOL 129 09/30/2011   HDL 67 09/30/2011   LDLCALC 49 09/30/2011   TRIG 65 09/30/2011   CHOLHDL 1.9 09/30/2011   No results found for this basenameConcepcion Elk     Chemistry      Component Value Date/Time   NA 133* 09/30/2011 0920   K 4.1 09/30/2011 0920   CL 96 09/30/2011 0920   CO2 28 09/30/2011 0920   BUN 10 09/30/2011 0920   CREATININE 0.70 09/30/2011 0920      Component Value Date/Time   CALCIUM 9.2 09/30/2011 0920   ALKPHOS 57 09/30/2011 0920   AST 18 09/30/2011 0920   ALT 13 09/30/2011 0920   BILITOT 0.5 09/30/2011 0920        Chemistry      Component Value Date/Time   NA 133* 09/30/2011 0920   K 4.1 09/30/2011 0920   CL 96 09/30/2011 0920   CO2 28 09/30/2011 0920   BUN 10 09/30/2011 0920   CREATININE 0.70 09/30/2011 0920      Component Value Date/Time   CALCIUM 9.2 09/30/2011 0920   ALKPHOS 57 09/30/2011 0920   AST 18 09/30/2011 0920   ALT 13 09/30/2011 0920   BILITOT 0.5 09/30/2011 0920     HbA1C 7.2  Last optometry/ophthalmology exam reviewed from:    Assessment:  Routine general medical examination at a health care facility  Diabetes mellitus - Plan: POCT glycosylated hemoglobin (Hb A1C), POCT UA - Microalbumin, CBC with Differential, Comprehensive metabolic panel, Lipid panel, pioglitazone-metformin (ACTOPLUS MET) 15-850 MG per tablet, Lower Extremity Arterial Doppler Bilateral  Hypertension associated with diabetes - Plan: POCT urinalysis dipstick, CBC with Differential, Comprehensive metabolic panel, lisinopril-hydrochlorothiazide (PRINZIDE,ZESTORETIC) 20-12.5 MG per tablet, amLODipine (NORVASC) 5 MG tablet  Immunization due - Plan: Tdap vaccine greater than or equal  to 7yo IM  Hyperlipidemia LDL goal < 70 - Plan: rosuvastatin (CRESTOR) 20 MG tablet  Arthritis  Moderate nonproliferative diabetic retinopathy(362.05)  Encounter for long-term (current) use of other medications - Plan: CBC with Differential, Comprehensive metabolic panel, Lipid panel        Plan:    1.  Rx changes: none 2.  Education: Reviewed 'ABCs' of diabetes management (respective goals in parentheses):  A1C (<7), blood pressure  (<130/80), and cholesterol (LDL <100). 3.  Compliance at present is estimated to be fair. Efforts to improve compliance (if necessary) will be directed at none. 4. Follow up: 4 months

## 2012-08-06 LAB — LIPID PANEL
Cholesterol: 149 mg/dL (ref 0–200)
HDL: 76 mg/dL (ref >39)
LDL Cholesterol: 62 mg/dL (ref 0–99)
Total CHOL/HDL Ratio: 2 Ratio
Triglycerides: 57 mg/dL (ref <150)
VLDL: 11 mg/dL (ref 0–40)

## 2012-08-06 LAB — COMPREHENSIVE METABOLIC PANEL
ALT: 14 U/L (ref 0–35)
AST: 16 U/L (ref 0–37)
Albumin: 4.3 g/dL (ref 3.5–5.2)
Alkaline Phosphatase: 71 U/L (ref 39–117)
BUN: 15 mg/dL (ref 6–23)
CO2: 32 mEq/L (ref 19–32)
Calcium: 9.6 mg/dL (ref 8.4–10.5)
Chloride: 97 mEq/L (ref 96–112)
Creat: 0.73 mg/dL (ref 0.50–1.10)
Glucose, Bld: 231 mg/dL — ABNORMAL HIGH (ref 70–99)
Potassium: 4.1 mEq/L (ref 3.5–5.3)
Sodium: 135 mEq/L (ref 135–145)
Total Bilirubin: 0.5 mg/dL (ref 0.3–1.2)
Total Protein: 6.5 g/dL (ref 6.0–8.3)

## 2012-08-06 NOTE — Progress Notes (Signed)
Quick Note:  CALLED PT SHE WAS INFORMED WORD FOR WORD Labs look good. Continue present medications. ______

## 2012-08-11 ENCOUNTER — Ambulatory Visit (HOSPITAL_COMMUNITY)
Admission: RE | Admit: 2012-08-11 | Discharge: 2012-08-11 | Disposition: A | Discharge status: Home / Self Care | Payer: Medicare Other | Source: Ambulatory Visit | Attending: Family Medicine | Admitting: Family Medicine

## 2012-08-11 DIAGNOSIS — M79609 Pain in unspecified limb: Secondary | ICD-10-CM

## 2012-08-11 DIAGNOSIS — R209 Unspecified disturbances of skin sensation: Secondary | ICD-10-CM | POA: Insufficient documentation

## 2012-08-11 DIAGNOSIS — L299 Pruritus, unspecified: Secondary | ICD-10-CM | POA: Insufficient documentation

## 2012-08-11 DIAGNOSIS — E785 Hyperlipidemia, unspecified: Secondary | ICD-10-CM | POA: Insufficient documentation

## 2012-08-11 DIAGNOSIS — I10 Essential (primary) hypertension: Secondary | ICD-10-CM | POA: Insufficient documentation

## 2012-08-11 DIAGNOSIS — E119 Type 2 diabetes mellitus without complications: Secondary | ICD-10-CM

## 2012-08-11 NOTE — Progress Notes (Signed)
VASCULAR LAB PRELIMINARY  ARTERIAL  ABI completed: Palpable pedal pulses indicative of adequate perfusion to lower extremities.  Normal ABI study.    RIGHT    LEFT    PRESSURE WAVEFORM  PRESSURE WAVEFORM  BRACHIAL 148 Tri BRACHIAL 143 Tri  DP   DP    AT 175 Tri AT 168 Tri  PT 156 Bi PT 154 Bi  PER   PER    GREAT TOE  NA GREAT TOE  NA    RIGHT LEFT  ABI 1.18 1.14     Farrel Demark, RDMS, RVT  08/11/2012, 10:42 AM

## 2012-08-11 NOTE — Progress Notes (Signed)
Quick Note:  PT INFORMED AND VERBALIZED UNDERSTANDING  ______ 

## 2012-11-01 LAB — HM DIABETES EYE EXAM

## 2013-01-24 ENCOUNTER — Ambulatory Visit (INDEPENDENT_AMBULATORY_CARE_PROVIDER_SITE_OTHER): Payer: Medicare Other | Admitting: Family Medicine

## 2013-01-24 VITALS — BP 142/80 | HR 60 | Wt 176.0 lb

## 2013-01-24 DIAGNOSIS — I10 Essential (primary) hypertension: Secondary | ICD-10-CM

## 2013-01-24 DIAGNOSIS — E1159 Type 2 diabetes mellitus with other circulatory complications: Secondary | ICD-10-CM

## 2013-01-24 DIAGNOSIS — I152 Hypertension secondary to endocrine disorders: Secondary | ICD-10-CM

## 2013-01-24 DIAGNOSIS — E785 Hyperlipidemia, unspecified: Secondary | ICD-10-CM

## 2013-01-24 DIAGNOSIS — E1169 Type 2 diabetes mellitus with other specified complication: Secondary | ICD-10-CM

## 2013-01-24 DIAGNOSIS — E119 Type 2 diabetes mellitus without complications: Secondary | ICD-10-CM

## 2013-01-24 DIAGNOSIS — E11339 Type 2 diabetes mellitus with moderate nonproliferative diabetic retinopathy without macular edema: Secondary | ICD-10-CM

## 2013-01-24 LAB — POCT GLYCOSYLATED HEMOGLOBIN (HGB A1C): Hemoglobin A1C: 7.9

## 2013-01-24 NOTE — Progress Notes (Signed)
Subjective:    Kristin Cook is a 79 y.o. female who presents for follow-up of Type 2 diabetes mellitus.    Home blood sugar records: 283 HIGHEST   Current symptoms/problems KIDNEYS PROBLEM continued difficulty with nocturia. She is able to void without difficulty but thinks her stream is less intense Daily foot checks:  Any foot concerns: YES/INGROWN TOE NAILS BOTH BIG TOES . She turns her nails herself. She also complains of bilateral plantar pain but is taking no medications. Last eye exam:  11/01/12   Medication compliance: good Current diet: NONE/WATCHES WHAT SHE EATS Current exercise: WALKING usually just twice per week. Known diabetic complications: retinopathy ,neuropathy Cardiovascular risk factors: advanced age (older than 95 for men, 7 for women), diabetes mellitus, dyslipidemia, hypertension and sedentary lifestyle   The following portions of the patient's history were reviewed and updated as appropriate: allergies, current medications, past family history, past medical history, past social history and problem list.  ROS as in subjective above    Objective:   General appearence: alert, no distress, WD/WN    Lab Review Lab Results  Component Value Date   HGBA1C 7.3 08/05/2012   Lab Results  Component Value Date   CHOL 149 08/05/2012   HDL 76 08/05/2012   LDLCALC 62 08/05/2012   TRIG 57 08/05/2012   CHOLHDL 2.0 08/05/2012   No results found for this basenameConcepcion Elk     Chemistry      Component Value Date/Time   NA 135 08/05/2012 0934   K 4.1 08/05/2012 0934   CL 97 08/05/2012 0934   CO2 32 08/05/2012 0934   BUN 15 08/05/2012 0934   CREATININE 0.73 08/05/2012 0934      Component Value Date/Time   CALCIUM 9.6 08/05/2012 0934   ALKPHOS 71 08/05/2012 0934   AST 16 08/05/2012 0934   ALT 14 08/05/2012 0934   BILITOT 0.5 08/05/2012 0934        Chemistry      Component Value Date/Time   NA 135 08/05/2012 0934   K 4.1 08/05/2012 0934   CL 97  08/05/2012 0934   CO2 32 08/05/2012 0934   BUN 15 08/05/2012 0934   CREATININE 0.73 08/05/2012 0934      Component Value Date/Time   CALCIUM 9.6 08/05/2012 0934   ALKPHOS 71 08/05/2012 0934   AST 16 08/05/2012 0934   ALT 14 08/05/2012 0934   BILITOT 0.5 08/05/2012 0934       Hemoglobin A1c is 7.9    Assessment:   Encounter Diagnoses  Name Primary?  . Diabetes mellitus Yes  . Moderate nonproliferative diabetic retinopathy(362.05)   . Hypertension associated with diabetes   . Hyperlipidemia LDL goal < 70          Plan:    1.  Rx changes: none 2.  Education: Reviewed 'ABCs' of diabetes management (respective goals in parentheses):  A1C (<7), blood pressure (<130/80), and cholesterol (LDL <100). 3.  Compliance at present is estimated to be fair. Efforts to improve compliance (if necessary) will be directed at increased exercise. 4. Follow up: 4 months   Encouraged her to become more physically active and work on getting her blood sugars down by at least 30 points. She has been able to do this in the past. Discussed the fact that if no improvement, another medication will need to be at regimen which she does not want to do. Recommend she try Tylenol for her foot pain so she can get out  and walk more.

## 2013-02-24 LAB — HM DIABETES EYE EXAM

## 2013-02-28 ENCOUNTER — Other Ambulatory Visit: Payer: Self-pay | Admitting: Ophthalmology

## 2013-03-08 ENCOUNTER — Encounter (HOSPITAL_COMMUNITY): Payer: Self-pay

## 2013-03-11 ENCOUNTER — Encounter (HOSPITAL_COMMUNITY): Payer: Self-pay | Admitting: *Deleted

## 2013-03-11 NOTE — Progress Notes (Signed)
Patient reports that she has a note from Dr Rankin's office stating that she can have clear liquids and toast until 0700 the am of surgery.

## 2013-03-15 ENCOUNTER — Encounter (HOSPITAL_COMMUNITY): Payer: Medicare Other

## 2013-03-15 ENCOUNTER — Ambulatory Visit (HOSPITAL_COMMUNITY): Payer: Medicare Other

## 2013-03-15 ENCOUNTER — Encounter (HOSPITAL_COMMUNITY): Payer: Self-pay | Admitting: Anesthesiology

## 2013-03-15 ENCOUNTER — Ambulatory Visit (HOSPITAL_COMMUNITY)
Admission: RE | Admit: 2013-03-15 | Discharge: 2013-03-15 | Disposition: A | Discharge status: Home / Self Care | Payer: Medicare Other | Source: Ambulatory Visit | Attending: Ophthalmology | Admitting: Ophthalmology

## 2013-03-15 ENCOUNTER — Encounter (HOSPITAL_COMMUNITY)
Admission: RE | Disposition: A | Discharge status: Home / Self Care | Payer: Self-pay | Source: Ambulatory Visit | Attending: Ophthalmology

## 2013-03-15 DIAGNOSIS — H43829 Vitreomacular adhesion, unspecified eye: Secondary | ICD-10-CM | POA: Insufficient documentation

## 2013-03-15 DIAGNOSIS — Z87891 Personal history of nicotine dependence: Secondary | ICD-10-CM | POA: Insufficient documentation

## 2013-03-15 DIAGNOSIS — E1139 Type 2 diabetes mellitus with other diabetic ophthalmic complication: Secondary | ICD-10-CM | POA: Insufficient documentation

## 2013-03-15 DIAGNOSIS — E11349 Type 2 diabetes mellitus with severe nonproliferative diabetic retinopathy without macular edema: Secondary | ICD-10-CM | POA: Insufficient documentation

## 2013-03-15 DIAGNOSIS — I10 Essential (primary) hypertension: Secondary | ICD-10-CM | POA: Insufficient documentation

## 2013-03-15 DIAGNOSIS — H43822 Vitreomacular adhesion, left eye: Secondary | ICD-10-CM

## 2013-03-15 HISTORY — PX: PARS PLANA VITRECTOMY: SHX2166

## 2013-03-15 HISTORY — DX: Unspecified dementia, unspecified severity, without behavioral disturbance, psychotic disturbance, mood disturbance, and anxiety: F03.90

## 2013-03-15 HISTORY — DX: Mental disorder, not otherwise specified: F99

## 2013-03-15 HISTORY — DX: Neuralgia and neuritis, unspecified: M79.2

## 2013-03-15 HISTORY — DX: Shortness of breath: R06.02

## 2013-03-15 HISTORY — DX: Overactive bladder: N32.81

## 2013-03-15 LAB — CBC
HCT: 38.4 % (ref 36.0–46.0)
Hemoglobin: 13 g/dL (ref 12.0–15.0)
MCH: 31.1 pg (ref 26.0–34.0)
MCHC: 33.9 g/dL (ref 30.0–36.0)
MCV: 91.9 fL (ref 78.0–100.0)
Platelets: 187 10*3/uL (ref 150–400)
RBC: 4.18 MIL/uL (ref 3.87–5.11)
RDW: 12.7 % (ref 11.5–15.5)
WBC: 6.3 10*3/uL (ref 4.0–10.5)

## 2013-03-15 LAB — BASIC METABOLIC PANEL
BUN: 10 mg/dL (ref 6–23)
CO2: 28 mEq/L (ref 19–32)
Calcium: 9.3 mg/dL (ref 8.4–10.5)
Chloride: 99 mEq/L (ref 96–112)
Creatinine, Ser: 0.63 mg/dL (ref 0.50–1.10)
GFR calc Af Amer: >90 mL/min (ref >90)
GFR calc non Af Amer: 83 mL/min — ABNORMAL LOW (ref >90)
Glucose, Bld: 226 mg/dL — ABNORMAL HIGH (ref 70–99)
Potassium: 3.7 mEq/L (ref 3.7–5.3)
Sodium: 139 mEq/L (ref 137–147)

## 2013-03-15 LAB — GLUCOSE, CAPILLARY
Glucose-Capillary: 184 mg/dL — ABNORMAL HIGH (ref 70–99)
Glucose-Capillary: 185 mg/dL — ABNORMAL HIGH (ref 70–99)

## 2013-03-15 SURGERY — PARS PLANA VITRECTOMY WITH 25 GAUGE
Anesthesia: Monitor Anesthesia Care | Site: Eye | Laterality: Left

## 2013-03-15 MED ORDER — POLYMYXIN B SULFATE 500000 UNITS IJ SOLR
INTRAMUSCULAR | Status: AC
  Filled 2013-03-15: qty 1

## 2013-03-15 MED ORDER — SODIUM HYALURONATE 10 MG/ML IO SOLN
INTRAOCULAR | Status: AC
  Filled 2013-03-15: qty 0.85

## 2013-03-15 MED ORDER — MIDAZOLAM HCL 5 MG/5ML IJ SOLN
INTRAMUSCULAR | Status: DC | PRN
  Administered 2013-03-15: 0.5 mg via INTRAVENOUS

## 2013-03-15 MED ORDER — EPINEPHRINE HCL 1 MG/ML IJ SOLN
INTRAMUSCULAR | Status: DC | PRN
  Administered 2013-03-15: .3 mg

## 2013-03-15 MED ORDER — BSS PLUS IO SOLN
INTRAOCULAR | Status: DC | PRN
  Administered 2013-03-15: 1 via INTRAOCULAR

## 2013-03-15 MED ORDER — GATIFLOXACIN 0.5 % OP SOLN
1.0000 [drp] | OPHTHALMIC | Status: AC | PRN
  Administered 2013-03-15 (×3): 1 [drp] via OPHTHALMIC
  Filled 2013-03-15: qty 2.5

## 2013-03-15 MED ORDER — FENTANYL CITRATE 0.05 MG/ML IJ SOLN
INTRAMUSCULAR | Status: DC | PRN
  Administered 2013-03-15: 25 ug via INTRAVENOUS

## 2013-03-15 MED ORDER — BSS PLUS IO SOLN
INTRAOCULAR | Status: AC
  Filled 2013-03-15: qty 500

## 2013-03-15 MED ORDER — PHENYLEPHRINE HCL 2.5 % OP SOLN
1.0000 [drp] | OPHTHALMIC | Status: AC | PRN
  Administered 2013-03-15 (×3): 1 [drp] via OPHTHALMIC
  Filled 2013-03-15: qty 15

## 2013-03-15 MED ORDER — SODIUM CHLORIDE 0.9 % IJ SOLN
INTRAMUSCULAR | Status: AC
  Filled 2013-03-15: qty 10

## 2013-03-15 MED ORDER — GENTAMICIN SULFATE 40 MG/ML IJ SOLN
INTRAMUSCULAR | Status: AC
  Filled 2013-03-15: qty 2

## 2013-03-15 MED ORDER — HYPROMELLOSE (GONIOSCOPIC) 2.5 % OP SOLN
OPHTHALMIC | Status: AC
  Filled 2013-03-15: qty 15

## 2013-03-15 MED ORDER — DEXAMETHASONE SODIUM PHOSPHATE 10 MG/ML IJ SOLN
INTRAMUSCULAR | Status: AC
  Filled 2013-03-15: qty 1

## 2013-03-15 MED ORDER — SODIUM CHLORIDE 0.9 % IV SOLN
INTRAVENOUS | Status: DC
  Administered 2013-03-15: 14:00:00 via INTRAVENOUS

## 2013-03-15 MED ORDER — DEXAMETHASONE SODIUM PHOSPHATE 10 MG/ML IJ SOLN
INTRAMUSCULAR | Status: DC | PRN
  Administered 2013-03-15: 10 mg

## 2013-03-15 MED ORDER — LIDOCAINE HCL 2 % IJ SOLN
INTRAMUSCULAR | Status: DC | PRN
Start: 2013-03-15 — End: 2013-03-15
  Administered 2013-03-15: 10 mL

## 2013-03-15 MED ORDER — TETRACAINE HCL 0.5 % OP SOLN
OPHTHALMIC | Status: AC
  Filled 2013-03-15: qty 2

## 2013-03-15 MED ORDER — CYCLOPENTOLATE HCL 1 % OP SOLN
1.0000 [drp] | OPHTHALMIC | Status: AC | PRN
  Administered 2013-03-15 (×3): 1 [drp] via OPHTHALMIC
  Filled 2013-03-15: qty 2

## 2013-03-15 MED ORDER — ONDANSETRON HCL 4 MG/2ML IJ SOLN
INTRAMUSCULAR | Status: DC | PRN
  Administered 2013-03-15: 4 mg via INTRAVENOUS

## 2013-03-15 MED ORDER — PROPOFOL 10 MG/ML IV BOLUS
INTRAVENOUS | Status: DC | PRN
  Administered 2013-03-15: 30 mg via INTRAVENOUS

## 2013-03-15 MED ORDER — NA CHONDROIT SULF-NA HYALURON 40-30 MG/ML IO SOLN
INTRAOCULAR | Status: AC
  Filled 2013-03-15: qty 0.5

## 2013-03-15 MED ORDER — LIDOCAINE HCL (CARDIAC) 20 MG/ML IV SOLN
INTRAVENOUS | Status: DC | PRN
  Administered 2013-03-15: 20 mg via INTRAVENOUS

## 2013-03-15 MED ORDER — EPINEPHRINE HCL 1 MG/ML IJ SOLN
INTRAMUSCULAR | Status: AC
  Filled 2013-03-15: qty 1

## 2013-03-15 MED ORDER — LIDOCAINE HCL 2 % IJ SOLN
INTRAMUSCULAR | Status: AC
  Filled 2013-03-15: qty 20

## 2013-03-15 MED ORDER — SODIUM CHLORIDE 0.9 % IV SOLN
INTRAVENOUS | Status: DC | PRN
  Administered 2013-03-15 (×2): via INTRAVENOUS

## 2013-03-15 SURGICAL SUPPLY — 60 items
APPLICATOR COTTON TIP 6IN STRL (MISCELLANEOUS) ×2 IMPLANT
APPLICATOR DR MATTHEWS STRL (MISCELLANEOUS) IMPLANT
BLADE MVR KNIFE 20G (BLADE) IMPLANT
CANNULA ANT CHAM MAIN (OPHTHALMIC RELATED) IMPLANT
CANNULA VLV SOFT TIP 25GA (OPHTHALMIC) IMPLANT
CORDS BIPOLAR (ELECTRODE) ×2 IMPLANT
COVER MAYO STAND STRL (DRAPES) ×2 IMPLANT
DRAPE INCISE 51X51 W/FILM STRL (DRAPES) IMPLANT
DRAPE OPHTHALMIC 77X100 STRL (CUSTOM PROCEDURE TRAY) IMPLANT
DRAPE ORTHO SPLIT 77X108 STRL (DRAPES) ×1
DRAPE SURG ORHT 6 SPLT 77X108 (DRAPES) ×1 IMPLANT
FILTER BLUE MILLIPORE (MISCELLANEOUS) IMPLANT
FORCEPS ECKARDT ILM 25G SERR (OPHTHALMIC RELATED) ×2 IMPLANT
FORCEPS GRIESHABER ILM 25G A (INSTRUMENTS) IMPLANT
FORCEPS HORIZONTAL 25G DISP (OPHTHALMIC RELATED) IMPLANT
FORCEPS ILM 25G DSP TIP (MISCELLANEOUS) IMPLANT
GAS AUTO FILL CONSTEL (OPHTHALMIC)
GAS AUTO FILL CONSTELLATION (OPHTHALMIC) IMPLANT
GLOVE SS BIOGEL STRL SZ 8.5 (GLOVE) ×1 IMPLANT
GLOVE SUPERSENSE BIOGEL SZ 8.5 (GLOVE) ×1
GOWN PREVENTION PLUS XLARGE (GOWN DISPOSABLE) ×2 IMPLANT
GOWN STRL NON-REIN LRG LVL3 (GOWN DISPOSABLE) ×2 IMPLANT
HANDLE PNEUMATIC FOR CONSTEL (OPHTHALMIC) IMPLANT
KIT BASIN OR (CUSTOM PROCEDURE TRAY) ×2 IMPLANT
KIT PERFLUORON PROCEDURE 5ML (MISCELLANEOUS) IMPLANT
KIT ROOM TURNOVER OR (KITS) ×2 IMPLANT
KNIFE CRESCENT 2.5 55 ANG (BLADE) IMPLANT
LENS BIOM SUPER VIEW SET DISP (OPHTHALMIC RELATED) ×2 IMPLANT
MASK EYE SHIELD (GAUZE/BANDAGES/DRESSINGS) ×2 IMPLANT
MICROPICK 25G (MISCELLANEOUS)
NEEDLE 18GX1X1/2 (RX/OR ONLY) (NEEDLE) IMPLANT
NEEDLE 25GX 5/8IN NON SAFETY (NEEDLE) IMPLANT
NEEDLE FILTER BLUNT 18X 1/2SAF (NEEDLE) ×1
NEEDLE FILTER BLUNT 18X1 1/2 (NEEDLE) ×1 IMPLANT
NEEDLE HYPO 25GX1X1/2 BEV (NEEDLE) ×2 IMPLANT
NS IRRIG 1000ML POUR BTL (IV SOLUTION) ×2 IMPLANT
PACK FRAGMATOME (OPHTHALMIC) IMPLANT
PACK VITRECTOMY CUSTOM (CUSTOM PROCEDURE TRAY) ×2 IMPLANT
PAD ARMBOARD 7.5X6 YLW CONV (MISCELLANEOUS) ×4 IMPLANT
PAD EYE OVAL STERILE LF (GAUZE/BANDAGES/DRESSINGS) ×2 IMPLANT
PAK PIK VITRECTOMY CVS 25GA (OPHTHALMIC) ×2 IMPLANT
PENCIL BIPOLAR 25GA STR DISP (OPHTHALMIC RELATED) IMPLANT
PICK MICROPICK 25G (MISCELLANEOUS) IMPLANT
PROBE LASER ILLUM FLEX CVD 25G (OPHTHALMIC) ×2 IMPLANT
ROLLS DENTAL (MISCELLANEOUS) IMPLANT
SCRAPER DIAMOND 25GA (OPHTHALMIC RELATED) IMPLANT
SET FLUID INJECTOR (SET/KITS/TRAYS/PACK) IMPLANT
STOCKINETTE IMPERVIOUS 9X36 MD (GAUZE/BANDAGES/DRESSINGS) ×4 IMPLANT
STOPCOCK 4 WAY LG BORE MALE ST (IV SETS) IMPLANT
SUT ETHILON 10 0 CS140 6 (SUTURE) IMPLANT
SUT ETHILON 8 0 BV130 4 (SUTURE) IMPLANT
SUT MERSILENE 5 0 RD 1 DA (SUTURE) IMPLANT
SUT PROLENE 10 0 CIF 4 DA (SUTURE) IMPLANT
SUT VICRYL 7 0 TG140 8 (SUTURE) IMPLANT
SYR 30ML SLIP (SYRINGE) IMPLANT
SYR 5ML LL (SYRINGE) IMPLANT
SYR TB 1ML LUER SLIP (SYRINGE) IMPLANT
TOWEL OR 17X26 10 PK STRL BLUE (TOWEL DISPOSABLE) ×4 IMPLANT
WATER STERILE IRR 1000ML POUR (IV SOLUTION) ×2 IMPLANT
WIPE INSTRUMENT VISIWIPE 73X73 (MISCELLANEOUS) ×2 IMPLANT

## 2013-03-15 NOTE — H&P (Signed)
Kristin Cook is an 80 y.o. female.   Chief Complaint: BLURRED VISION LEFT EYE HAMPERS READING HPI: PAINLESS LOSS OF VISION LEFT EYE.  Past Medical History  Diagnosis Date  . Diabetes mellitus   . Dyslipidemia   . Arthritis   . Hypertension   . History of colonic polyps   . Cystocele   . Hx MRSA infection   . Cataract   . Shortness of breath     with exertion  . Neuropathic pain of lower extremity   . Mental disorder   . Dementia     forgetfulness  . OAB (overactive bladder)     Past Surgical History  Procedure Laterality Date  . Vaginal prolapse repair  2007    Dr. Gaetano Net  . Abdominal hysterectomy    . Eye surgery Bilateral     Cataract    History reviewed. No pertinent family history. Social History:  reports that she has quit smoking. She has never used smokeless tobacco. She reports that she does not drink alcohol or use illicit drugs.  Allergies:  Allergies  Allergen Reactions  . Penicillins Swelling  . Sulfa Antibiotics Swelling    Weak     Medications Prior to Admission  Medication Sig Dispense Refill  . amLODipine (NORVASC) 5 MG tablet Take 1 tablet (5 mg total) by mouth daily.  30 tablet  prn  . diphenhydramine-acetaminophen (TYLENOL PM) 25-500 MG TABS Take 1 tablet by mouth at bedtime as needed.      Marland Kitchen lisinopril-hydrochlorothiazide (PRINZIDE,ZESTORETIC) 20-12.5 MG per tablet Take 1 tablet by mouth daily.  30 tablet  prn  . pioglitazone-metformin (ACTOPLUS MET) 15-850 MG per tablet Take 1 tablet by mouth 2 (two) times daily with a meal.  60 tablet  5  . rosuvastatin (CRESTOR) 20 MG tablet Take 1 tablet (20 mg total) by mouth daily.  30 tablet  11    Results for orders placed during the hospital encounter of 03/15/13 (from the past 48 hour(s))  CBC     Status: None   Collection Time    03/15/13  1:08 PM      Result Value Range   WBC 6.3  4.0 - 10.5 K/uL   RBC 4.18  3.87 - 5.11 MIL/uL   Hemoglobin 13.0  12.0 - 15.0 g/dL   HCT 38.4  36.0 - 46.0 %    MCV 91.9  78.0 - 100.0 fL   MCH 31.1  26.0 - 34.0 pg   MCHC 33.9  30.0 - 36.0 g/dL   RDW 12.7  11.5 - 15.5 %   Platelets 187  150 - 400 K/uL  BASIC METABOLIC PANEL     Status: Abnormal   Collection Time    03/15/13  1:08 PM      Result Value Range   Sodium 139  137 - 147 mEq/L   Potassium 3.7  3.7 - 5.3 mEq/L   Chloride 99  96 - 112 mEq/L   CO2 28  19 - 32 mEq/L   Glucose, Bld 226 (*) 70 - 99 mg/dL   BUN 10  6 - 23 mg/dL   Creatinine, Ser 0.63  0.50 - 1.10 mg/dL   Calcium 9.3  8.4 - 10.5 mg/dL   GFR calc non Af Amer 83 (*) >90 mL/min   GFR calc Af Amer >90  >90 mL/min   Comment: (NOTE)     The eGFR has been calculated using the CKD EPI equation.     This calculation has not  been validated in all clinical situations.     eGFR's persistently <90 mL/min signify possible Chronic Kidney     Disease.  GLUCOSE, CAPILLARY     Status: Abnormal   Collection Time    03/15/13  1:42 PM      Result Value Range   Glucose-Capillary 184 (*) 70 - 99 mg/dL  GLUCOSE, CAPILLARY     Status: Abnormal   Collection Time    03/15/13  3:34 PM      Result Value Range   Glucose-Capillary 185 (*) 70 - 99 mg/dL   Dg Chest 2 View  03/15/2013   CLINICAL DATA:  Preoperative respiratory exam for left eye vitrectomy.  EXAM: CHEST - 2 VIEW  COMPARISON:  None  FINDINGS: The heart size and mediastinal contours are within normal limits. There is no evidence of pulmonary edema, consolidation, pneumothorax, nodule or pleural fluid. Bones are osteopenic.  IMPRESSION: No active disease.   Electronically Signed   By: Aletta Edouard M.D.   On: 03/15/2013 14:48    Review of Systems  Constitutional: Negative.   HENT: Negative.   Eyes: Positive for blurred vision.  Respiratory: Negative.   Cardiovascular: Negative.   Gastrointestinal: Negative.   Musculoskeletal: Negative.   Skin: Negative.   Neurological: Negative.   Endo/Heme/Allergies: Negative.     Blood pressure 157/64, pulse 73, temperature 98.5 F (36.9  C), temperature source Oral, resp. rate 18, height '5\' 3"'  (1.6 m), weight 78.472 kg (173 lb), SpO2 100.00%. Physical Exam  Constitutional: She is oriented to person, place, and time. Vital signs are normal. She appears well-developed.  HENT:  Head: Normocephalic and atraumatic.  Eyes: Conjunctivae and EOM are normal. Pupils are equal, round, and reactive to light.  VISION OD 20/25  OS  20/50 AND BLURRED.  OS HAS VITREOMACULAR ADHESION WITH FOVEAL DISTORTION ACCOUNTING FOR VISUAL DEFICIT   Neck: Normal range of motion.  Cardiovascular: Normal rate and regular rhythm.   Respiratory: Effort normal.  GI: Soft.  Musculoskeletal: Normal range of motion.  Neurological: She is alert and oriented to person, place, and time. She has normal reflexes.  Skin: Skin is warm, dry and intact.  Psychiatric: She has a normal mood and affect. Her speech is normal and behavior is normal. Judgment and thought content normal. Cognition and memory are normal.     Assessment/Plan VITREOMACULAR ADHESION WITH FOVEAL DISTORTION LEFT EYE WITH VISION LOSS.  PLANNED VITRECTOMY LEFT EYE, UNDER LOCAL MAC ANESTHESIA [name] A 03/15/2013, 3:53 PM

## 2013-03-15 NOTE — Transfer of Care (Signed)
Immediate Anesthesia Transfer of Care Note  Patient: Kristin Cook  Procedure(s) Performed: Procedure(s): PARS PLANA VITRECTOMY WITH 25 GAUGE LEFT EYE with Endolaser (Left)  Patient Location: PACU  Anesthesia Type:MAC  Level of Consciousness: awake and alert   Airway & Oxygen Therapy: Patient Spontanous Breathing and Patient connected to nasal cannula oxygen  Post-op Assessment: Report given to PACU RN, Post -op Vital signs reviewed and stable and Patient moving all extremities X 4  Post vital signs: Reviewed and stable  Complications: No apparent anesthesia complications

## 2013-03-15 NOTE — Brief Op Note (Signed)
03/15/2013  4:38 PM  PATIENT:  Michiel Sites Coalson  80 y.o. female  PRE-OPERATIVE DIAGNOSIS:  vitreomacular traction of the left eye  POST-OPERATIVE DIAGNOSIS:  * No post-op diagnosis entered *  PROCEDURE:  Procedure(s): PARS PLANA VITRECTOMY WITH 25 GAUGE LEFT EYE  (Left),,, panretinal endolaser  SURGEON:  Surgeon(s) and Role:    * Edmon Crape, MD - Primary  PHYSICIAN ASSISTANT:   ASSISTANTS: none   ANESTHESIA:   MAC,, with local retrobulbar  EBL:  Total I/O In: 500 [I.V.:500] Out: -   BLOOD ADMINISTERED:none  DRAINS: none   LOCAL MEDICATIONS USED:  Amount: 10 ml lidocaine  SPECIMEN:  No Specimen  DISPOSITION OF SPECIMEN:  N/A  COUNTS:  YES  TOURNIQUET:  * No tourniquets in log *  DICTATION: .Other Dictation: Dictation Number [id number]  PLAN OF CARE: Discharge to home after PACU  PATIENT DISPOSITION:  PACU - hemodynamically stable.   Delay start of Pharmacological VTE agent (>24hrs) due to surgical blood loss or risk of bleeding: not applicable

## 2013-03-15 NOTE — Anesthesia Preprocedure Evaluation (Addendum)
Anesthesia Evaluation  Patient identified by MRN, date of birth, ID band Patient awake    Reviewed: Allergy & Precautions, H&P , NPO status , Patient's Chart, lab work & pertinent test results  History of Anesthesia Complications Negative for: history of anesthetic complications  Airway Mallampati: II TM Distance: >3 FB Neck ROM: Full    Dental  (+) Edentulous Upper, Edentulous Lower and Dental Advisory Given   Pulmonary former smoker,  breath sounds clear to auscultation  Pulmonary exam normal       Cardiovascular hypertension, Pt. on medications Rhythm:Regular Rate:Normal     Neuro/Psych negative neurological ROS     GI/Hepatic negative GI ROS, Neg liver ROS,   Endo/Other  diabetes, Type 2, Oral Hypoglycemic AgentsGlucose = 184 @ 1342 185 @ 1535   Renal/GU negative Renal ROS     Musculoskeletal   Abdominal (+) + obese,   Peds  Hematology   Anesthesia Other Findings   Reproductive/Obstetrics                        Anesthesia Physical Anesthesia Plan  ASA: III  Anesthesia Plan: MAC   Post-op Pain Management:    Induction: Intravenous  Airway Management Planned: Natural Airway and Nasal Cannula  Additional Equipment:   Intra-op Plan:   Post-operative Plan:   Informed Consent: I have reviewed the patients History and Physical, chart, labs and discussed the procedure including the risks, benefits and alternatives for the proposed anesthesia with the patient or authorized representative who has indicated his/her understanding and acceptance.     Plan Discussed with: CRNA and Surgeon  Anesthesia Plan Comments: (Plan routine monitors, MAC )        Anesthesia Quick Evaluation

## 2013-03-15 NOTE — Preoperative (Signed)
Beta Blockers   Reason not to administer Beta Blockers:Not Applicable 

## 2013-03-15 NOTE — Anesthesia Procedure Notes (Signed)
Procedure Name: MAC Date/Time: 03/15/2013 4:00 PM Performed by: Lovie Chol Pre-anesthesia Checklist: Timeout performed, Emergency Drugs available, Suction available, Patient being monitored and Patient identified Patient Re-evaluated:Patient Re-evaluated prior to inductionOxygen Delivery Method: Nasal cannula

## 2013-03-16 ENCOUNTER — Encounter (HOSPITAL_COMMUNITY): Payer: Self-pay | Admitting: Ophthalmology

## 2013-03-16 NOTE — Op Note (Signed)
NAMETAILER, VOLKERT              ACCOUNT NO.:  [id number]  MEDICAL RECORD NO.:  [id number]  LOCATION:  [facility]                         FACILITY:  [facility]  PHYSICIAN:  Jillyn Hidden A. Jeweliana Dudgeon, M.D.   DATE OF BIRTH:  10/25/1931  DATE OF PROCEDURE:  03/15/2013 DATE OF DISCHARGE:  03/15/2013                              OPERATIVE REPORT   PREOPERATIVE DIAGNOSIS:  Vitreal macular traction syndrome, left eye with vision loss.  POSTOPERATIVE DIAGNOSES: 1. Vitreal macular traction syndrome, left eye with vision loss. 2. Severe nonproliferative diabetic retinopathy.  PROCEDURE:  Posterior vitrectomy and panretinal laser photocoagulation, left eye.  SURGEON:  Alford Highland. Tomeika Weinmann, M.D.  INDICATION FOR PROCEDURE:  The patient is a 80 year old woman who has impact visual acuity, visual functioning, reading vision, based on vitreal macular traction syndrome.  This in the setting of nonproliferative diabetic retinopathy.  The patient understands this in an attempt to release tractional off the posterior hyaloid upon the macular region and hopefully allow for visual acuity improvement.  She understands the risks of anesthesia including rare recurrence of death, loss of the eye from the underlying condition including but not limited to hemorrhage, infection, scarring, need for another surgery, change in vision, loss of vision, progressive disease despite intervention. Appropriate signed consent was obtained.  DESCRIPTION OF THE PROCEDURE:  The patient was taken to the operating room.  In the operating room, appropriate monitors were followed by mild sedation.  Appropriate sites were confirmed in the left eye with the operating staff thereafter under mild sedation, 2% Xylocaine 5 mL injected retrobulbar with additional 5 mL laterally in the fashion of modified Darel Hong. The right periocular region was sterilely prepped and draped in usual sterile ophthalmic fashion.  Lid speculum applied.  25- gauge trocar  placed in the inferotemporal quadrant.  Placement was verified visually.  Infusion turned on.  Superior trocars applied.  Core vitrectomy was then begun.  Physician induced hyaloid detachment was carried out without active suction nasal to the optic nerve.  The posterior hyaloid were off the optic nerve, macular region and the vitreal foveal traction was released gently.  The hyaloid was elevated __________ 360 degrees.  Vitreous base shaved 360 degrees and anterior hyaloid confirmed to be removed.  The subretinal fluid in diabetic retinopathy is present.  I elected to go in placement of photocoagulation to prevent progression with post vitrectomize and pseudophakic left eye.  Photocoagulation was completed in this fashion.  No complications occurred.  Retina was inspected and found to be free of retinal holes or tears.  No macular hole form.  __________ cleared from the eye. __________ trocars removed from the eyes.  Infusion was then removed and subconjunctival Decadron applied.  Sterile patch and fox shield applied.  Intraocular compression was assessed and found to be adequate.  Sterile patch and Fox shield applied.  The patient taken to the PACU in good stable condition.  Hemodynamically stable.     Alford Highland Joan Herschberger, M.D.     GAR/MEDQ  D:  03/15/2013  T:  03/16/2013  Job:  [id number]

## 2013-03-16 NOTE — Anesthesia Postprocedure Evaluation (Signed)
  Anesthesia Post-op Note  Patient: Kristin Cook  Procedure(s) Performed: Procedure(s): PARS PLANA VITRECTOMY WITH 25 GAUGE LEFT EYE with Endolaser (Left)  Patient discharged with no apparent anesthetic complications

## 2013-03-31 ENCOUNTER — Encounter: Payer: Self-pay | Admitting: Internal Medicine

## 2013-03-31 ENCOUNTER — Encounter: Payer: Self-pay | Admitting: Family Medicine

## 2013-04-04 ENCOUNTER — Other Ambulatory Visit: Payer: Self-pay | Admitting: Family Medicine

## 2013-05-10 ENCOUNTER — Encounter: Payer: Self-pay | Admitting: Family Medicine

## 2013-05-10 ENCOUNTER — Ambulatory Visit (INDEPENDENT_AMBULATORY_CARE_PROVIDER_SITE_OTHER): Payer: Medicare Other | Admitting: Family Medicine

## 2013-05-10 VITALS — BP 128/68 | HR 68 | Wt 178.0 lb

## 2013-05-10 DIAGNOSIS — I152 Hypertension secondary to endocrine disorders: Secondary | ICD-10-CM

## 2013-05-10 DIAGNOSIS — Z23 Encounter for immunization: Secondary | ICD-10-CM

## 2013-05-10 DIAGNOSIS — E11339 Type 2 diabetes mellitus with moderate nonproliferative diabetic retinopathy without macular edema: Secondary | ICD-10-CM

## 2013-05-10 DIAGNOSIS — Z79899 Other long term (current) drug therapy: Secondary | ICD-10-CM

## 2013-05-10 DIAGNOSIS — E1169 Type 2 diabetes mellitus with other specified complication: Secondary | ICD-10-CM

## 2013-05-10 DIAGNOSIS — E119 Type 2 diabetes mellitus without complications: Secondary | ICD-10-CM

## 2013-05-10 DIAGNOSIS — I10 Essential (primary) hypertension: Secondary | ICD-10-CM

## 2013-05-10 DIAGNOSIS — E785 Hyperlipidemia, unspecified: Secondary | ICD-10-CM

## 2013-05-10 DIAGNOSIS — E1159 Type 2 diabetes mellitus with other circulatory complications: Secondary | ICD-10-CM

## 2013-05-10 LAB — POCT GLYCOSYLATED HEMOGLOBIN (HGB A1C): Hemoglobin A1C: 8.2

## 2013-05-10 MED ORDER — PIOGLITAZONE HCL-METFORMIN HCL 15-850 MG PO TABS: ORAL_TABLET | ORAL | Status: DC

## 2013-05-10 NOTE — Progress Notes (Signed)
   Subjective:    Patient ID: Kristin Cook, female    DOB: 1931/03/26, 80 y.o.   MRN: [id number]  HPI She is here for a followup visit. She continues on her present medications. Her physical activity level is quite limited. She does check her blood sugars daily but does not remember the numbers. She did remember today of being 180. She does check her feet daily and does have occasional tingling and swelling in her ankles. She has had recent eye surgery and hasn't I. 0.7 up for June.. Smoking and drinking were reviewed. She lives with one of her children.   Review of Systems Negative except as above    Objective:   Physical Exam Alert and in no distress otherwise not examined. Hemoglobin A1c is 8.2       Assessment & Plan:  Diabetes mellitus - Plan: HgB A1c, HM DIABETES FOOT EXAM, Amb Referral to Nutrition and Diabetic E, HM DIABETES FOOT EXAM  Hyperlipidemia LDL goal < 70  Hypertension associated with diabetes  Moderate nonproliferative diabetic retinopathy(362.05)  Encounter for long-term (current) use of other medications - Plan: Pneumococcal polysaccharide vaccine 23-valent greater than or equal to 2yo subcutaneous/IM  I discussed options with her including diet and exercise changes versus another medication. She would like to try and make further exercise changes and see what that will do to lower her A1c. Also instructed her on using the glucometer more appropriately especially 2 hours after eating

## 2013-05-10 NOTE — Patient Instructions (Signed)
Walk for 20 minutes every day and you can split it up if you want to. If there is ice or thundering and lightening, then I understand otherwise walk

## 2013-06-27 ENCOUNTER — Emergency Department (HOSPITAL_COMMUNITY)
Admission: EM | Admit: 2013-06-27 | Discharge: 2013-06-27 | Disposition: A | Discharge status: Home / Self Care | Payer: Medicare Other | Source: Home / Self Care | Attending: Family Medicine | Admitting: Family Medicine

## 2013-06-27 ENCOUNTER — Encounter (HOSPITAL_COMMUNITY): Payer: Self-pay | Admitting: Emergency Medicine

## 2013-06-27 ENCOUNTER — Emergency Department (INDEPENDENT_AMBULATORY_CARE_PROVIDER_SITE_OTHER): Payer: Medicare Other

## 2013-06-27 DIAGNOSIS — S92309A Fracture of unspecified metatarsal bone(s), unspecified foot, initial encounter for closed fracture: Secondary | ICD-10-CM

## 2013-06-27 DIAGNOSIS — S92333A Displaced fracture of third metatarsal bone, unspecified foot, initial encounter for closed fracture: Secondary | ICD-10-CM

## 2013-06-27 DIAGNOSIS — Y9389 Activity, other specified: Secondary | ICD-10-CM

## 2013-06-27 DIAGNOSIS — X500XXA Overexertion from strenuous movement or load, initial encounter: Secondary | ICD-10-CM

## 2013-06-27 NOTE — ED Provider Notes (Signed)
Kristin Cook is a 80 y.o. female who presents to Urgent Care today for right foot pain. Patient has one and a half to 2 weeks of distal foot pain. The pain occurred after she stepped on a rock and twisted her foot. She has swelling and pain. Pain is worse with activity. She denies any radiating pain weakness or numbness. She denies any fevers or chills nausea vomiting or diarrhea.   Past Medical History  Diagnosis Date  . Diabetes mellitus   . Dyslipidemia   . Arthritis   . Hypertension   . History of colonic polyps   . Cystocele   . Hx MRSA infection   . Cataract   . Shortness of breath     with exertion  . Neuropathic pain of lower extremity   . Mental disorder   . Dementia     forgetfulness  . OAB (overactive bladder)    History  Substance Use Topics  . Smoking status: Former Research scientist (life sciences)  . Smokeless tobacco: Never Used     Comment: Several years ago  . Alcohol Use: No   ROS as above Medications: No current facility-administered medications for this encounter.   Current Outpatient Prescriptions  Medication Sig Dispense Refill  . amLODipine (NORVASC) 5 MG tablet Take 1 tablet (5 mg total) by mouth daily.  30 tablet  prn  . diphenhydramine-acetaminophen (TYLENOL PM) 25-500 MG TABS Take 1 tablet by mouth at bedtime as needed.      Marland Kitchen lisinopril-hydrochlorothiazide (PRINZIDE,ZESTORETIC) 20-12.5 MG per tablet Take 1 tablet by mouth daily.  30 tablet  prn  . pioglitazone-metformin (ACTOPLUS MET) 15-850 MG per tablet TAKE 1 TABLET BY MOUTH TWICE DAILY WITH MEALS  60 tablet  3  . rosuvastatin (CRESTOR) 20 MG tablet Take 1 tablet (20 mg total) by mouth daily.  30 tablet  11    Exam:  BP 156/66  Pulse 72  Temp(Src) 98.7 F (37.1 C) (Oral)  Resp 18  SpO2 99% Gen: Well NAD HEENT: EOMI,  MMM Lungs: Normal work of breathing. CTABL Heart: RRR no MRG Abd: NABS, Soft. NT, ND Exts: Brisk capillary refill, warm and well perfused.  Right foot: Swollen and tender over the distal  metatarsals 2-4. Capillary refill and sensation are intact. Pulses are intact foot. Ankle is nontender. Contralateral left foot is normal appearing and nontender with intact pulses capillary refill and sensation  No results found for this or any previous visit (from the past 24 hour(s)). Dg Foot Complete Right  06/27/2013   CLINICAL DATA:  Injury with 4 foot pain.  EXAM: RIGHT FOOT COMPLETE - 3+ VIEW  COMPARISON:  None.  FINDINGS: There is a transverse fracture of the distal diaphysis of the third metatarsal without angulation or displacement. No other regional injury.  IMPRESSION: Transverse fracture of the distal diaphysis of the third metatarsal without angulation or displacement.   Electronically Signed   By: Nelson Chimes M.D.   On: 06/27/2013 09:05    Assessment and Plan: 80 y.o. female with right foot third metatarsal transverse shaft fracture. No significant displacement. Plan for postoperative shoe followup with primary care provider.  Discussed warning signs or symptoms. Please see discharge instructions. Patient expresses understanding.    Gregor Hams, MD 06/27/13 1024

## 2013-06-27 NOTE — Discharge Instructions (Signed)
Thank you for coming in today. Please followup with Dr. Susann Givens in about 2 weeks. Use the postoperative shoe. Use Tylenol for pain as needed  Metatarsal Fracture  with Rehab A metatarsal fracture is a break (fracture) of one of the bones of the mid-foot (metatarsal bones). The metatarsal bones are responsible for maintaining the arch of the foot. There are three classifications of metatarsal fractures: dancer's fractures, Jones fractures, and stress fractures. A dancer's fracture is when a piece of bone is pulled off by a ligament or tendon (avulsion fracture) of the outer part of the foot (fifth metatarsal), near the joint with the ankle bones. A Jones fracture occurs in the middle of the fifth metatarsal. These fractures have limited ability to heal. A stress fracture occurs when the bone is slowly injured faster than it can repair itself. SYMPTOMS   Sharp pain, especially with standing or walking.  Tenderness, swelling, and later bruising (contusion) of the foot.  Numbness or paralysis from swelling in the foot, causing pressure on the blood vessels or nerves (uncommon). CAUSES  Fractures occur when a force is placed on the bone that is greater than it can handle. Common causes of injury include:  Direct hit (trauma) to the foot.  Twisting injury to the foot or ankle.  Landing on the foot and ankle in an improper position. RISK INCRESES WITH:  Participation in contact sports, sports that require jumping and landing, or sports in which cleats are worn and sliding occurs.  Previous foot or ankle sprains or dislocations.  Repeated injury to any joint in the foot.  Poor strength and flexibility. PREVENTION  Warm up and stretch properly before an activity.  Allow for adequate recovery between workouts.  Maintain physical fitness in:  Strength, flexibility, and endurance.  Cardiovascular fitness.  When participating in jumping or contact sports, protect joints with supportive  devices, such as wrapped elastic bandages, tape, braces, or high-top athletic shoes.  Wear properly fitted and padded protective equipment. PROGNOSIS If treated properly, metatarsal fractures usually heal well. Jones fractures have a higher risk of the bone failing to heal (nonunion). Sometimes, surgery is needed to heal Jones fractures.  RELATED COMPLICATIONS   Nonunion.  Fracture heals in a poor position (malunion).  Long-term (chronic) pain, stiffness, or swelling of the foot.  Excessive bleeding in the foot or at the dislocation site, causing pressure and injury to nerves and blood vessels (rare).  Unstable or arthritic joint following repeated injury or delayed treatment. TREATMENT  Treatment first involves the use of ice and medicine, to reduce pain and inflammation. If the bone fragments are out of alignment (displaced), then immediate realigning of the bones (reduction) is required. Fractures that cannot be realigned by hand, or where the bones protrude through the skin (open), may require surgery to hold the fracture in place with screws, pins, and plates. After the bones are in proper alignment, the foot and ankle must be restrained for 6 or more weeks. Restraint allows healing to occur. After restraint, it is important to perform strengthening and stretching exercises to help regain strength and a full range of motion. These exercises may be completed at home or with a therapist. A stiff-soled shoe and arch support (orthotic) may be required when first returning to sports. MEDICATION   If pain medicine is needed, nonsteroidal anti-inflammatory medicines (NSAIDS), or other minor pain relievers, are often advised.  Do not take pain medicine for 7 days before surgery.  Only take over-the-counter or prescription medicines for  pain, fever, or discomfort as directed by your caregiver. COLD THERAPY  Cold treatment (icing) should be applied for 10 to 15 minutes every 2 to 3 hours for  inflammations and pain, and immediately after activity that aggravates your symptoms. Use ice packs or ice massage. SEEK MEDICAL CARE IF:  Pain, tenderness, or swelling gets worse, despite treatment.  You experience pain, numbness, or coldness in the foot.  Blue, gray, or dark color appears in the toenails.  You or your child has an oral temperature above 102 F (38.9 C).  You have increased pain, swelling, and redness.  You have drainage of fluids or bleeding in the affected area.  New, unexplained symptoms develop. (Drugs used in treatment may produce side effects.)

## 2013-06-27 NOTE — ED Notes (Signed)
Reports twisting foot approx 2 weeks ago.  Patient reports she has some swelling, but this is more than usual.  Pedal pulses 2 plus.

## 2013-06-28 ENCOUNTER — Telehealth: Payer: Self-pay | Admitting: Family Medicine

## 2013-06-28 NOTE — Telephone Encounter (Signed)
ED letter sent

## 2013-07-11 ENCOUNTER — Ambulatory Visit (INDEPENDENT_AMBULATORY_CARE_PROVIDER_SITE_OTHER): Payer: Medicare Other | Admitting: Family Medicine

## 2013-07-11 ENCOUNTER — Encounter: Payer: Self-pay | Admitting: Family Medicine

## 2013-07-11 VITALS — BP 126/80 | HR 72 | Wt 170.0 lb

## 2013-07-11 DIAGNOSIS — S92331A Displaced fracture of third metatarsal bone, right foot, initial encounter for closed fracture: Secondary | ICD-10-CM

## 2013-07-11 DIAGNOSIS — S92309A Fracture of unspecified metatarsal bone(s), unspecified foot, initial encounter for closed fracture: Secondary | ICD-10-CM

## 2013-07-11 NOTE — Patient Instructions (Signed)
Use the wooden shoe for one more week and then regular shoes. Make sure you walk in a normal pattern and if you have trouble let me know

## 2013-07-11 NOTE — Progress Notes (Signed)
   Subjective:    Patient ID: Kristin Cook, female    DOB: July 02, 1931, 80 y.o.   MRN: [id number]  HPI She originally injured her foot in early April. She was seen in an urgent care center on April 20. X-rays showed a fracture of the third metatarsal distally. She has been wearing a wooden shoe and doing most of her walking on her heel since then. She is here for followup.   Review of Systems     Objective:   Physical Exam Exam of the right foot does show slight tenderness over the distal third metatarsal. The x-rays were reviewed.       Assessment & Plan:  Fracture of third metatarsal bone of right foot  recommend she wear the wooden shoe for one more week but to try and walk as normally as possible. After that where her shoes and if she starts having difficulty, back off on physical activities. She will return here if continued difficulty

## 2013-07-13 ENCOUNTER — Encounter: Payer: Medicare Other | Attending: Family Medicine | Admitting: Dietician

## 2013-07-13 ENCOUNTER — Encounter: Payer: Self-pay | Admitting: Dietician

## 2013-07-13 VITALS — Ht 63.0 in | Wt 170.2 lb

## 2013-07-13 DIAGNOSIS — Z713 Dietary counseling and surveillance: Secondary | ICD-10-CM | POA: Insufficient documentation

## 2013-07-13 DIAGNOSIS — E119 Type 2 diabetes mellitus without complications: Secondary | ICD-10-CM

## 2013-07-13 NOTE — Patient Instructions (Addendum)
Plan:  Aim for 2 Carb Choices per meal (30 grams) +/- 1 either way  Aim for 0-15grams Carbs per snack if hungry Consider including protein with meals and snacks Consider eating a breakfast in the morning of carbohydrate and protein-cottage cheese and half a banana Consider reading food labels for Total Carbohydrate and Fat Grams of foods Continue walking 30 minutes daily as tolerated once foot heals

## 2013-07-13 NOTE — Progress Notes (Signed)
  Medical Nutrition Therapy:  Appt start time: 1050 end time:  1200.   Assessment:  Primary concerns today: Diabetes, blood sugar not under control for some time. Feels that everything else that she has done has not worked well so agrees that she needs diet education. Came to [facility] on [address] years ago. Has recently changed diet to decrease carbohydrate intake. Patient has some knowledge already about carbohyrates, but needs a refresher, support, and some further education.  Preferred Learning Style:   No preference indicated   Learning Readiness:   Change in progress   MEDICATIONS: See Chart   DIETARY INTAKE:  Usual eating pattern includes 2 meals and 2-3 snacks per day.  Everyday foods include vegetables, processed meats, bread, rice, mint candies.  Avoided foods include .    24-hr recall:  Wake: anytime between 7:00-10:00 depending on if she has someplace to go B ( AM): Skips breakfast,  Snk ( AM):  if she does eat something she will have a banana or hot dog.  L (2:00 PM): vegetables - carrots, greens, vegetable soup, tomato soup, sliced [place], chicken, spaghetti, wheat bread. Loves cranberry sauce, sugar free jelly - sometimes just snacks Snk ( PM): none D (5:00/6:00 PM): Vegetables, toast with sugar free jelly, sandwich when she goes out or chicken nuggets Snk ( PM): chicken hot dog or fat free cottage cheese with raisins Beverages: diet caffeine free ginger ale, hot tea, decaf coffee with creamer, no sweatener. When goes out drinks water  Before: mint candies, ice cream, etc,   Eat Out: [employer] - chicken sandwich or hamburger sometimes french fries-gets small order and shares, [place] sandwich  Usual physical activity: Walks - 30-45 minutes everyday on treadmill. Also flea market - walks few minutes at a time, tries to move around.   Blood Sugar:   Checks sometimes - but not everyday   Estimated energy needs: 1600 calories 135 g carbohydrates 120 g protein 44 g  fat  Progress Towards Goal(s):  In progress.   Nutritional Diagnosis:  [place]-2.2 Altered nutrition-related laboratory As related to excessive intake of carbohydrates.  As evidenced by A1C of 8.2 in March and patient report.    Intervention:  Nutrition education.  Reviewed foods that elevate blood sugar and foods that do not, carbohydrate counting, nutrition label reading for carbohydrate counting.  Plan:  Aim for 2 Carb Choices per meal (30 grams) +/- 1 either way  Aim for 0-15grams Carbs per snack if hungry Consider including protein with meals and snacks Consider eating a breakfast in the morning of carbohydrate and protein-cottage cheese and half a banana Consider reading food labels for Total Carbohydrate and Fat Grams of foods Continue walking 30 minutes daily as tolerated once foot heals  Teaching Method Utilized: Visual Auditory Hands on  Handouts given during visit include:  Yellow Card  Snack handout  Barriers to learning/adherence to lifestyle change: Patient says that they do not remember things well  Demonstrated degree of understanding via:  Teach Back   Monitoring/Evaluation:  Dietary intake, exercise, carbohydrate counting, and body weight prn.

## 2013-08-11 LAB — HM DIABETES EYE EXAM

## 2013-08-23 ENCOUNTER — Other Ambulatory Visit: Payer: Self-pay | Admitting: Family Medicine

## 2013-10-04 ENCOUNTER — Other Ambulatory Visit: Payer: Self-pay

## 2013-10-04 ENCOUNTER — Ambulatory Visit (INDEPENDENT_AMBULATORY_CARE_PROVIDER_SITE_OTHER): Payer: Medicare Other | Admitting: Family Medicine

## 2013-10-04 ENCOUNTER — Encounter: Payer: Self-pay | Admitting: Family Medicine

## 2013-10-04 VITALS — BP 120/70 | HR 81 | Wt 162.0 lb

## 2013-10-04 DIAGNOSIS — E1139 Type 2 diabetes mellitus with other diabetic ophthalmic complication: Secondary | ICD-10-CM

## 2013-10-04 DIAGNOSIS — I152 Hypertension secondary to endocrine disorders: Secondary | ICD-10-CM

## 2013-10-04 DIAGNOSIS — E1159 Type 2 diabetes mellitus with other circulatory complications: Secondary | ICD-10-CM

## 2013-10-04 DIAGNOSIS — E1169 Type 2 diabetes mellitus with other specified complication: Secondary | ICD-10-CM

## 2013-10-04 DIAGNOSIS — E11319 Type 2 diabetes mellitus with unspecified diabetic retinopathy without macular edema: Secondary | ICD-10-CM

## 2013-10-04 DIAGNOSIS — E785 Hyperlipidemia, unspecified: Secondary | ICD-10-CM

## 2013-10-04 DIAGNOSIS — I10 Essential (primary) hypertension: Secondary | ICD-10-CM

## 2013-10-04 LAB — LIPID PANEL
Cholesterol: 159 mg/dL (ref 0–200)
HDL: 68 mg/dL (ref >39)
LDL Cholesterol: 77 mg/dL (ref 0–99)
Total CHOL/HDL Ratio: 2.3 Ratio
Triglycerides: 71 mg/dL (ref <150)
VLDL: 14 mg/dL (ref 0–40)

## 2013-10-04 LAB — POCT GLYCOSYLATED HEMOGLOBIN (HGB A1C): Hemoglobin A1C: 7.4

## 2013-10-04 MED ORDER — GLUCOSE BLOOD VI STRP: ORAL_STRIP | Status: DC

## 2013-10-04 NOTE — Telephone Encounter (Signed)
ORDERED TEST STRIPS

## 2013-10-04 NOTE — Progress Notes (Signed)
Subjective:    Kristin Cook is a 80 y.o. female who presents for follow-up of Type 2 diabetes mellitus.    Home blood sugar records: 180 TO 200  Current symptoms/problems LEGS Daily foot checks: Any foot concerns: NONE Last eye exam:  03/31/13   Medication compliance: Good Current diet: WENT TO DIETICIAN FEELS LIKE SHE IS DOING BETTER. He now has a much better understanding of more frequent smaller meals as well as cutting back on carbohydrates. Current exercise: WALKING 30 MIN 5 DAYS A WEEK Known diabetic complications: retinopathy Cardiovascular risk factors: advanced age (older than 71 for men, 41 for women), diabetes mellitus, dyslipidemia and hypertension   The following portions of the patient's history were reviewed and updated as appropriate: allergies, current medications, past family history, past medical history, past social history and problem list.  ROS as in subjective above    Objective:    There were no vitals taken for this visit.  There were no vitals filed for this visit.  General appearence: alert, no distress, WD/WN Neck: supple, no lymphadenopathy, no thyromegaly, no masses Heart: RRR, normal S1, S2, no murmurs Lungs: CTA bilaterally, no wheezes, rhonchi, or rales Abdomen: +bs, soft, non tender, non distended, no masses, no hepatomegaly, no splenomegaly Pulses: 2+ symmetric, upper and lower extremities, normal cap refill Ext: no edema Foot exam:  Neuro: foot monofilament exam normal   Lab Review Lab Results  Component Value Date   HGBA1C 8.2 05/10/2013   Lab Results  Component Value Date   CHOL 149 08/05/2012   HDL 76 08/05/2012   LDLCALC 62 08/05/2012   TRIG 57 08/05/2012   CHOLHDL 2.0 08/05/2012   No results found for this basenameConcepcion Elk     Chemistry      Component Value Date/Time   NA 139 03/15/2013 1308   K 3.7 03/15/2013 1308   CL 99 03/15/2013 1308   CO2 28 03/15/2013 1308   BUN 10 03/15/2013 1308   CREATININE 0.63  03/15/2013 1308   CREATININE 0.73 08/05/2012 0934      Component Value Date/Time   CALCIUM 9.3 03/15/2013 1308   ALKPHOS 71 08/05/2012 0934   AST 16 08/05/2012 0934   ALT 14 08/05/2012 0934   BILITOT 0.5 08/05/2012 0934        Chemistry      Component Value Date/Time   NA 139 03/15/2013 1308   K 3.7 03/15/2013 1308   CL 99 03/15/2013 1308   CO2 28 03/15/2013 1308   BUN 10 03/15/2013 1308   CREATININE 0.63 03/15/2013 1308   CREATININE 0.73 08/05/2012 0934      Component Value Date/Time   CALCIUM 9.3 03/15/2013 1308   ALKPHOS 71 08/05/2012 0934   AST 16 08/05/2012 0934   ALT 14 08/05/2012 0934   BILITOT 0.5 08/05/2012 0934       Hemoglobin A1c 7.4   Assessment:  Hypertension associated with diabetes  Hyperlipidemia with target LDL less than 70 - Plan: Lipid panel  Type 2 diabetes mellitus with diabetic retinopathy and without macular edema, with unspecified retinopathy severity - Plan: POCT glycosylated hemoglobin (Hb A1C)        Plan:    1.  Rx changes: none 2.  Education: Reviewed 'ABCs' of diabetes management (respective goals in parentheses):  A1C (<7), blood pressure (<130/80), and cholesterol (LDL <100). 3.  Compliance at present is estimated to be good. Efforts to improve compliance (if necessary) will be directed at regular blood sugar monitoring: daily  and That will be fine. 4. Follow up: 4 months  I complimented her on learning about her nutrition. Encouraged her to continue with this.

## 2013-10-05 ENCOUNTER — Other Ambulatory Visit: Payer: Self-pay

## 2013-10-05 DIAGNOSIS — I10 Essential (primary) hypertension: Principal | ICD-10-CM

## 2013-10-05 DIAGNOSIS — E1159 Type 2 diabetes mellitus with other circulatory complications: Secondary | ICD-10-CM

## 2013-10-05 DIAGNOSIS — I152 Hypertension secondary to endocrine disorders: Secondary | ICD-10-CM

## 2013-10-05 MED ORDER — PIOGLITAZONE HCL-METFORMIN HCL 15-850 MG PO TABS: ORAL_TABLET | ORAL | Status: DC

## 2013-10-05 MED ORDER — ROSUVASTATIN CALCIUM 20 MG PO TABS: 20.0000 mg | ORAL_TABLET | Freq: Every day | ORAL | Status: DC

## 2013-10-05 MED ORDER — LISINOPRIL-HYDROCHLOROTHIAZIDE 20-12.5 MG PO TABS: 1.0000 | ORAL_TABLET | Freq: Every day | ORAL | Status: DC

## 2013-10-05 MED ORDER — AMLODIPINE BESYLATE 5 MG PO TABS: 5.0000 mg | ORAL_TABLET | Freq: Every day | ORAL | Status: DC

## 2013-10-05 NOTE — Addendum Note (Signed)
Addended by: Ronnald Nian on: 10/05/2013 12:16 PM   Modules accepted: Orders

## 2013-10-05 NOTE — Telephone Encounter (Signed)
Sent in meds per pt request

## 2013-11-15 ENCOUNTER — Encounter: Payer: Self-pay | Admitting: Internal Medicine

## 2013-12-13 ENCOUNTER — Other Ambulatory Visit: Payer: Self-pay | Admitting: Family Medicine

## 2014-02-06 ENCOUNTER — Encounter: Payer: Self-pay | Admitting: Family Medicine

## 2014-02-06 ENCOUNTER — Ambulatory Visit (INDEPENDENT_AMBULATORY_CARE_PROVIDER_SITE_OTHER): Payer: Medicare Other | Admitting: Family Medicine

## 2014-02-06 VITALS — BP 120/70 | HR 87 | Wt 154.0 lb

## 2014-02-06 DIAGNOSIS — I10 Essential (primary) hypertension: Secondary | ICD-10-CM

## 2014-02-06 DIAGNOSIS — E1169 Type 2 diabetes mellitus with other specified complication: Secondary | ICD-10-CM

## 2014-02-06 DIAGNOSIS — E11339 Type 2 diabetes mellitus with moderate nonproliferative diabetic retinopathy without macular edema: Secondary | ICD-10-CM

## 2014-02-06 DIAGNOSIS — E113399 Type 2 diabetes mellitus with moderate nonproliferative diabetic retinopathy without macular edema, unspecified eye: Secondary | ICD-10-CM

## 2014-02-06 DIAGNOSIS — I152 Hypertension secondary to endocrine disorders: Secondary | ICD-10-CM

## 2014-02-06 DIAGNOSIS — E1159 Type 2 diabetes mellitus with other circulatory complications: Secondary | ICD-10-CM

## 2014-02-06 LAB — POCT GLYCOSYLATED HEMOGLOBIN (HGB A1C): Hemoglobin A1C: 7.9

## 2014-02-06 LAB — POCT UA - MICROALBUMIN
Albumin/Creatinine Ratio, Urine, POC: 38.6
Creatinine, POC: 60.4 mg/dL
Microalbumin Ur, POC: 23.3 mg/L

## 2014-02-06 NOTE — Patient Instructions (Signed)
Check your blood sugars 2 hours after a lunch or evening meal

## 2014-02-06 NOTE — Progress Notes (Signed)
  Subjective:    Kristin Cook is a 80 y.o. female who presents for follow-up of Type 2 diabetes mellitus.    Home blood sugar records: Patient is to test Bid  Current symptoms/problems urination alot Daily foot checks:  Any foot concerns: none Last eye exam:  August 11 2013   Medication compliance: Good Current diet: She has gone to [employer] and she said she has learned alot Current exercise: walking some everyday Known diabetic complications: retinopathy Cardiovascular risk factors: advanced age (older than 27 for men, 31 for women), diabetes mellitus and hypertension   The following portions of the patient's history were reviewed and updated as appropriate: allergies, current medications, past family history, past medical history, past social history and problem list.  ROS as in subjective above    Objective:    BP 120/70 mmHg  Pulse 87  Wt 154 lb (69.854 kg)  SpO2 97%  Filed Vitals:   02/06/14 0920  BP: 120/70  Pulse: 87    General appearence: alert, no distress, WD/WN   Lab Review Lab Results  Component Value Date   HGBA1C 7.9 02/06/2014   Lab Results  Component Value Date   CHOL 159 10/04/2013   HDL 68 10/04/2013   LDLCALC 77 10/04/2013   TRIG 71 10/04/2013   CHOLHDL 2.3 10/04/2013   No results found for: [name]   Chemistry      Component Value Date/Time   NA 139 03/15/2013 1308   K 3.7 03/15/2013 1308   CL 99 03/15/2013 1308   CO2 28 03/15/2013 1308   BUN 10 03/15/2013 1308   CREATININE 0.63 03/15/2013 1308   CREATININE 0.73 08/05/2012 0934      Component Value Date/Time   CALCIUM 9.3 03/15/2013 1308   ALKPHOS 71 08/05/2012 0934   AST 16 08/05/2012 0934   ALT 14 08/05/2012 0934   BILITOT 0.5 08/05/2012 0934        Chemistry      Component Value Date/Time   NA 139 03/15/2013 1308   K 3.7 03/15/2013 1308   CL 99 03/15/2013 1308   CO2 28 03/15/2013 1308   BUN 10 03/15/2013 1308   CREATININE 0.63 03/15/2013 1308   CREATININE 0.73 08/05/2012 0934      Component Value Date/Time   CALCIUM 9.3 03/15/2013 1308   ALKPHOS 71 08/05/2012 0934   AST 16 08/05/2012 0934   ALT 14 08/05/2012 0934   BILITOT 0.5 08/05/2012 0934       A1c 7.9  Assessment:  Hypertension associated with diabetes - Plan: POCT glycosylated hemoglobin (Hb A1C), POCT UA - Microalbumin  Type 2 diabetes mellitus with moderate nonproliferative diabetic retinopathy and without macular edema  Moderate nonproliferative diabetic retinopathy, without macular edema, associated with type 2 diabetes mellitus        Plan:    1.  Rx changes: none 2.  Education: Reviewed 'ABCs' of diabetes management (respective goals in parentheses):  A1C (<7), blood pressure (<130/80), and cholesterol (LDL <100). 3.  Compliance at present is estimated to be good. Efforts to improve compliance (if necessary) will be directed at increased exercise. 4. Follow up: 4 months   Did encourage her to increase her physical activity and also check blood sugars 2 hours after a lunch or evening meal.

## 2014-03-06 ENCOUNTER — Other Ambulatory Visit: Payer: Self-pay | Admitting: Family Medicine

## 2014-04-10 ENCOUNTER — Other Ambulatory Visit: Payer: Self-pay | Admitting: Family Medicine

## 2014-05-16 ENCOUNTER — Other Ambulatory Visit: Payer: Self-pay | Admitting: Family Medicine

## 2014-06-08 ENCOUNTER — Ambulatory Visit (INDEPENDENT_AMBULATORY_CARE_PROVIDER_SITE_OTHER): Payer: Medicare Other | Admitting: Family Medicine

## 2014-06-08 ENCOUNTER — Encounter: Payer: Self-pay | Admitting: Family Medicine

## 2014-06-08 VITALS — BP 120/70 | HR 69 | Wt 156.2 lb

## 2014-06-08 DIAGNOSIS — E785 Hyperlipidemia, unspecified: Secondary | ICD-10-CM | POA: Diagnosis not present

## 2014-06-08 DIAGNOSIS — I152 Hypertension secondary to endocrine disorders: Secondary | ICD-10-CM

## 2014-06-08 DIAGNOSIS — E11339 Type 2 diabetes mellitus with moderate nonproliferative diabetic retinopathy without macular edema: Secondary | ICD-10-CM

## 2014-06-08 DIAGNOSIS — E1159 Type 2 diabetes mellitus with other circulatory complications: Secondary | ICD-10-CM

## 2014-06-08 DIAGNOSIS — M1990 Unspecified osteoarthritis, unspecified site: Secondary | ICD-10-CM | POA: Diagnosis not present

## 2014-06-08 DIAGNOSIS — E1169 Type 2 diabetes mellitus with other specified complication: Secondary | ICD-10-CM

## 2014-06-08 DIAGNOSIS — E113399 Type 2 diabetes mellitus with moderate nonproliferative diabetic retinopathy without macular edema, unspecified eye: Secondary | ICD-10-CM

## 2014-06-08 DIAGNOSIS — I10 Essential (primary) hypertension: Secondary | ICD-10-CM | POA: Diagnosis not present

## 2014-06-08 LAB — CBC WITH DIFFERENTIAL/PLATELET
Basophils Absolute: 0.1 10*3/uL (ref 0.0–0.1)
Basophils Relative: 1 % (ref 0–1)
Eosinophils Absolute: 0.3 10*3/uL (ref 0.0–0.7)
Eosinophils Relative: 5 % (ref 0–5)
HCT: 40.3 % (ref 36.0–46.0)
Hemoglobin: 13.4 g/dL (ref 12.0–15.0)
Lymphocytes Relative: 24 % (ref 12–46)
Lymphs Abs: 1.4 10*3/uL (ref 0.7–4.0)
MCH: 29.8 pg (ref 26.0–34.0)
MCHC: 33.3 g/dL (ref 30.0–36.0)
MCV: 89.8 fL (ref 78.0–100.0)
MPV: 10.4 fL (ref 8.6–12.4)
Monocytes Absolute: 0.5 10*3/uL (ref 0.1–1.0)
Monocytes Relative: 8 % (ref 3–12)
Neutro Abs: 3.7 10*3/uL (ref 1.7–7.7)
Neutrophils Relative %: 62 % (ref 43–77)
Platelets: 192 10*3/uL (ref 150–400)
RBC: 4.49 MIL/uL (ref 3.87–5.11)
RDW: 14.2 % (ref 11.5–15.5)
WBC: 5.9 10*3/uL (ref 4.0–10.5)

## 2014-06-08 LAB — COMPREHENSIVE METABOLIC PANEL
ALT: 12 U/L (ref 0–35)
AST: 15 U/L (ref 0–37)
Albumin: 4.4 g/dL (ref 3.5–5.2)
Alkaline Phosphatase: 55 U/L (ref 39–117)
BUN: 13 mg/dL (ref 6–23)
CO2: 29 mEq/L (ref 19–32)
Calcium: 9.3 mg/dL (ref 8.4–10.5)
Chloride: 98 mEq/L (ref 96–112)
Creat: 0.59 mg/dL (ref 0.50–1.10)
Glucose, Bld: 202 mg/dL — ABNORMAL HIGH (ref 70–99)
Potassium: 3.6 mEq/L (ref 3.5–5.3)
Sodium: 137 mEq/L (ref 135–145)
Total Bilirubin: 0.4 mg/dL (ref 0.2–1.2)
Total Protein: 6.5 g/dL (ref 6.0–8.3)

## 2014-06-08 LAB — POCT GLYCOSYLATED HEMOGLOBIN (HGB A1C): Hemoglobin A1C: 8.1

## 2014-06-08 LAB — LIPID PANEL
Cholesterol: 157 mg/dL (ref 0–200)
HDL: 84 mg/dL (ref >=46)
LDL Cholesterol: 62 mg/dL (ref 0–99)
Total CHOL/HDL Ratio: 1.9 Ratio
Triglycerides: 53 mg/dL (ref <150)
VLDL: 11 mg/dL (ref 0–40)

## 2014-06-08 MED ORDER — PIOGLITAZONE HCL-METFORMIN HCL 15-850 MG PO TABS: 1.0000 | ORAL_TABLET | Freq: Two times a day (BID) | ORAL | Status: DC

## 2014-06-08 MED ORDER — LISINOPRIL-HYDROCHLOROTHIAZIDE 20-12.5 MG PO TABS: 1.0000 | ORAL_TABLET | Freq: Every day | ORAL | Status: DC

## 2014-06-08 MED ORDER — SAXAGLIPTIN HCL 2.5 MG PO TABS: 2.5000 mg | ORAL_TABLET | Freq: Every day | ORAL | Status: DC

## 2014-06-08 MED ORDER — ROSUVASTATIN CALCIUM 20 MG PO TABS: 20.0000 mg | ORAL_TABLET | Freq: Every day | ORAL | Status: DC

## 2014-06-08 NOTE — Patient Instructions (Signed)
E know if you have any trouble with the cost of a new medicine

## 2014-06-08 NOTE — Progress Notes (Signed)
  Subjective:    Patient ID: Kristin Cook, female    DOB: [birth year], 81 y.o.   MRN: [id number]  Kristin Cook is a 81 y.o. female who presents for follow-up of Type 2 diabetes mellitus.she continues to do quite nicely. She quit smoking quite some time ago. She does not have any history of falls. No memory issues. She is doing quite nicely at home and does have a boyfriend. Her physical activity is unchanged due to arthritis  Home blood sugar records: Patient test BID Current symptoms/problems pins and needles in legs and feet skin itches Daily foot checks:   Any foot concerns: same as above Exercise: walking 7 days a week 20 to 30 min at a time sometimes she walks 3 to 4 times a day EYE:08/11/2013 she is scheduled for another eye exam in the near future The following portions of the patient's history were reviewed and updated as appropriate: allergies, current medications, past medical history, past social history and problem list.  ROS as in subjective above.     Objective:    Physical Exam Alert and in no distress otherwise not examined. Lab Review Diabetic Labs Latest Ref Rng 02/06/2014 10/04/2013 05/10/2013 03/15/2013 01/24/2013  HbA1c - 7.9 7.4 8.2 - 7.9  Chol 0 - 200 mg/dL - 159 - - -  HDL >39 mg/dL - 68 - - -  Calc LDL 0 - 99 mg/dL - 77 - - -  Triglycerides <150 mg/dL - 71 - - -  Creatinine 0.50 - 1.10 mg/dL - - - 0.63 -   BP/Weight 02/06/2014 10/04/2013 07/13/2013 07/11/2013 03/19/3157  Systolic BP 458 592 - 924 462  Diastolic BP 70 70 - 80 66  Wt. (Lbs) 154 162 170.2 170 -  BMI 27.29 28.7 30.16 30.12 -   Foot/eye exam completion dates Latest Ref Rng 08/11/2013 05/10/2013  Eye Exam No Retinopathy No Retinopathy -  Foot Form Completion - - Done    Kristin Cook  reports that she has quit smoking. She has never used smokeless tobacco. She reports that she does not drink alcohol or use illicit drugs. Hemoglobin A1c is 8.1    Assessment & Plan:  Type 2 diabetes mellitus with moderate  nonproliferative diabetic retinopathy and without macular edema - Plan: POCT glycosylated hemoglobin (Hb A1C), pioglitazone-metformin (ACTOPLUS MET) 15-850 MG per tablet, CBC with Differential/Platelet, Comprehensive metabolic panel, Lipid panel, saxagliptin HCl (ONGLYZA) 2.5 MG TABS tablet, CANCELED: POCT UA - Microalbumin  Moderate nonproliferative diabetic retinopathy, without macular edema, associated with type 2 diabetes mellitus  Hypertension associated with diabetes - Plan: lisinopril-hydrochlorothiazide (PRINZIDE,ZESTORETIC) 20-12.5 MG per tablet, CBC with Differential/Platelet, Comprehensive metabolic panel  Hyperlipidemia associated with type 2 diabetes mellitus - Plan: rosuvastatin (CRESTOR) 20 MG tablet, Lipid panel  Arthritis   1. Rx changes: ONGLYZA added to the regimen 2. Education: Reviewed 'ABCs' of diabetes management (respective goals in parentheses):  A1C (<7), blood pressure (<130/80), and cholesterol (LDL <100). 3. Compliance at present is estimated to be good. Efforts to improve compliance (if necessary) will be directed at as above. 4. Follow up: 4 months

## 2014-08-23 DIAGNOSIS — E11349 Type 2 diabetes mellitus with severe nonproliferative diabetic retinopathy without macular edema: Secondary | ICD-10-CM | POA: Diagnosis not present

## 2014-08-23 DIAGNOSIS — H26492 Other secondary cataract, left eye: Secondary | ICD-10-CM | POA: Diagnosis not present

## 2014-08-23 DIAGNOSIS — Z961 Presence of intraocular lens: Secondary | ICD-10-CM | POA: Diagnosis not present

## 2014-08-23 DIAGNOSIS — E119 Type 2 diabetes mellitus without complications: Secondary | ICD-10-CM | POA: Diagnosis not present

## 2014-08-23 DIAGNOSIS — H1851 Endothelial corneal dystrophy: Secondary | ICD-10-CM | POA: Diagnosis not present

## 2014-10-10 ENCOUNTER — Encounter: Payer: Self-pay | Admitting: Family Medicine

## 2014-10-10 ENCOUNTER — Ambulatory Visit (INDEPENDENT_AMBULATORY_CARE_PROVIDER_SITE_OTHER): Payer: Medicare Other | Admitting: Family Medicine

## 2014-10-10 VITALS — BP 120/70 | HR 76 | Ht 63.0 in | Wt 162.0 lb

## 2014-10-10 DIAGNOSIS — E118 Type 2 diabetes mellitus with unspecified complications: Secondary | ICD-10-CM | POA: Diagnosis not present

## 2014-10-10 DIAGNOSIS — I10 Essential (primary) hypertension: Secondary | ICD-10-CM

## 2014-10-10 DIAGNOSIS — E785 Hyperlipidemia, unspecified: Secondary | ICD-10-CM | POA: Diagnosis not present

## 2014-10-10 DIAGNOSIS — M1990 Unspecified osteoarthritis, unspecified site: Secondary | ICD-10-CM

## 2014-10-10 DIAGNOSIS — B351 Tinea unguium: Secondary | ICD-10-CM

## 2014-10-10 DIAGNOSIS — I152 Hypertension secondary to endocrine disorders: Secondary | ICD-10-CM

## 2014-10-10 DIAGNOSIS — K130 Diseases of lips: Secondary | ICD-10-CM

## 2014-10-10 DIAGNOSIS — E11339 Type 2 diabetes mellitus with moderate nonproliferative diabetic retinopathy without macular edema: Secondary | ICD-10-CM

## 2014-10-10 DIAGNOSIS — E1169 Type 2 diabetes mellitus with other specified complication: Secondary | ICD-10-CM

## 2014-10-10 DIAGNOSIS — E113399 Type 2 diabetes mellitus with moderate nonproliferative diabetic retinopathy without macular edema, unspecified eye: Secondary | ICD-10-CM

## 2014-10-10 DIAGNOSIS — E1159 Type 2 diabetes mellitus with other circulatory complications: Secondary | ICD-10-CM

## 2014-10-10 LAB — POCT GLYCOSYLATED HEMOGLOBIN (HGB A1C): Hemoglobin A1C: 7.5

## 2014-10-10 NOTE — Patient Instructions (Addendum)
Check your blood sugars either before a meal or 2 hours after a meal. Set up an appointment with your eye doctor Use heat on your lip for 20 minutes a couple times per day. If the swelling gets worse call and come back in

## 2014-10-10 NOTE — Progress Notes (Signed)
  Subjective:    Patient ID: Kristin Cook, female    DOB: 10-02-1931, 81 y.o.   MRN: [id number]  Kristin Cook is a 81 y.o. female who presents for follow-up of Type 2 diabetes mellitus.  Home blood sugar records: Patient test BID a day Current symptoms/problems None Daily foot checks:yes   Any foot concerns:  toe nail fungus Exercise: walking  Eyes:08/11/2013 The following portions of the patient's history were reviewed and updated as appropriate: allergies, current medications, past medical history, past social history and problem list.  ROS as in subjective above.     Objective:    Physical Exam Alert and in no distress otherwise not examined.  Height 5\' 3"  (1.6 m), weight 162 lb (73.483 kg).  Lab Review Diabetic Labs Latest Ref Rng 06/08/2014 02/06/2014 10/04/2013 05/10/2013 03/15/2013  HbA1c - 8.1 7.9 7.4 8.2 -  Chol 0 - 200 mg/dL 05/13/2013 - 147 - -  HDL 829 mg/dL 84 - 68 - -  Calc LDL 0 - 99 mg/dL 62 - 77 - -  Triglycerides <150 mg/dL 53 - 71 - -  Creatinine 0.50 - 1.10 mg/dL >=56 - - - 2.13   BP/Weight 10/10/2014 06/08/2014 02/06/2014 10/04/2013 07/13/2013  Systolic BP - 120 120 120 -  Diastolic BP - 70 70 70 -  Wt. (Lbs) 162 156.2 154 162 170.2  BMI 28.7 27.68 27.29 28.7 30.16   Foot/eye exam completion dates Latest Ref Rng 08/11/2013 05/10/2013  Eye Exam No Retinopathy No Retinopathy -  Foot Form Completion - - Done   Hemoglobin A1c is 7.5 Kristin Cook  reports that she has quit smoking. She has never used smokeless tobacco. She reports that she does not drink alcohol or use illicit drugs.     Assessment & Plan:    Moderate nonproliferative diabetic retinopathy, without macular edema, associated with type 2 diabetes mellitus  Hypertension associated with diabetes  Hyperlipidemia associated with type 2 diabetes mellitus  Arthritis   1. Rx changes: none 2. Education: Reviewed 'ABCs' of diabetes management (respective goals in parentheses):  A1C (<7), blood pressure  (<130/80), and cholesterol (LDL <100). 3. Compliance at present is estimated to be good. Efforts to improve compliance (if necessary) will be directed at increased exercise. 4. Follow up: 4 months also encouraged her to set up an appointment with her eye doctor. Recommend checking her blood sugar once per day rather than twice per day. Recommend checking either before a meal or 2 hours after a meal.

## 2014-11-27 ENCOUNTER — Encounter: Payer: Self-pay | Admitting: Family Medicine

## 2014-11-27 ENCOUNTER — Ambulatory Visit (INDEPENDENT_AMBULATORY_CARE_PROVIDER_SITE_OTHER): Payer: Medicare Other | Admitting: Family Medicine

## 2014-11-27 VITALS — BP 168/90 | HR 68 | Temp 97.9°F | Wt 162.2 lb

## 2014-11-27 DIAGNOSIS — J069 Acute upper respiratory infection, unspecified: Secondary | ICD-10-CM

## 2014-11-27 NOTE — Progress Notes (Signed)
   Subjective:    Patient ID: Kristin Cook, female    DOB: 24-May-1931, 81 y.o.   MRN: [id number]  HPI She is here for a 2 week history of productive, congestion and nasal drainage. Her symptoms started with a sore throat and cough and body aches but sore throat. Denies fever, chills, nausea, vomiting, body aches, sore throat. Reports a decreased appetite since cold started. She does not smoke. Has been taking Nyquil 2-3 times per day. Denies allergies. Reports she feels at least 50 percent better.    Review of Systems Pertinent positives and negatives in the history of present illness.    Objective:   Physical Exam  Alert and in no distress. No sinus tenderness. Edema to nasal mucosal passages, right greater than left.Tympanic membranes and canals with cerumen but otherwise normal. Pharyngeal area is normal. Neck is supple without adenopathy. Cardiac exam shows a regular sinus rhythm without murmurs or gallops. Lungs are clear to auscultation.       Assessment & Plan:  Acute upper respiratory infection  Discussed that since she is about 50% better and no signs of infection that we will continue to treat her symptoms and give this more time. Sample of saline nasal spray given with instructions on how to use it. Recommended that she only take NyQuil, if needed, only at night and during the day take a nonsedating decongestant such as DayQuil. She will let us know if she's not better in about a week. Her blood pressure is elevated today and we discussed that this could be due to the cold medication she's taking. She also states she takes her blood pressure medication at night. She will return for a nurse visit to have a blood pressure check and 10-12 days approximately.

## 2014-12-15 ENCOUNTER — Other Ambulatory Visit: Payer: Self-pay | Admitting: Family Medicine

## 2015-02-08 ENCOUNTER — Other Ambulatory Visit: Payer: Self-pay | Admitting: Family Medicine

## 2015-02-13 ENCOUNTER — Encounter: Payer: Self-pay | Admitting: Family Medicine

## 2015-02-13 ENCOUNTER — Ambulatory Visit (INDEPENDENT_AMBULATORY_CARE_PROVIDER_SITE_OTHER): Payer: Medicare Other | Admitting: Family Medicine

## 2015-02-13 VITALS — BP 122/80 | HR 68 | Temp 98.4°F | Ht 63.0 in | Wt 164.2 lb

## 2015-02-13 DIAGNOSIS — E785 Hyperlipidemia, unspecified: Secondary | ICD-10-CM

## 2015-02-13 DIAGNOSIS — E119 Type 2 diabetes mellitus without complications: Secondary | ICD-10-CM | POA: Diagnosis not present

## 2015-02-13 DIAGNOSIS — E118 Type 2 diabetes mellitus with unspecified complications: Secondary | ICD-10-CM

## 2015-02-13 DIAGNOSIS — E1159 Type 2 diabetes mellitus with other circulatory complications: Secondary | ICD-10-CM | POA: Diagnosis not present

## 2015-02-13 DIAGNOSIS — E113399 Type 2 diabetes mellitus with moderate nonproliferative diabetic retinopathy without macular edema, unspecified eye: Secondary | ICD-10-CM

## 2015-02-13 DIAGNOSIS — E1169 Type 2 diabetes mellitus with other specified complication: Secondary | ICD-10-CM | POA: Diagnosis not present

## 2015-02-13 DIAGNOSIS — M1990 Unspecified osteoarthritis, unspecified site: Secondary | ICD-10-CM

## 2015-02-13 DIAGNOSIS — I10 Essential (primary) hypertension: Secondary | ICD-10-CM

## 2015-02-13 DIAGNOSIS — I152 Hypertension secondary to endocrine disorders: Secondary | ICD-10-CM

## 2015-02-13 LAB — POCT GLYCOSYLATED HEMOGLOBIN (HGB A1C): Hemoglobin A1C: 7.5

## 2015-02-13 NOTE — Progress Notes (Signed)
  Subjective:   Kristin Cook is an 81 y.o. female who presents for follow up of Type 2 diabetes mellitus.   Patient is checking home blood sugars.   Home blood sugar records: BGs range between 142 and 185 Current symptoms include: none. Patient denies foot ulcerations.  Patient is checking their feet daily. Foot concerns (callous, ulcer, wound, thickened nails, toenail fungus, skin fungus, hammer toe): NOne Last dilated eye exam unknown, appt next month. Her diabetes is under good control. Current treatments: Continued Actos which has been effective. Medication compliance: excellent  Current diet: in general, a "healthy" diet   Current exercise: walking she does occasionally have some arthritic pain especially in the left hip Known diabetic complications: retinopathy she has an appointment to see Dr. Luciana Axe in January.   The following portions of the patient's history were reviewed and updated as appropriate: allergies, current medications, past family history, past medical history, past social history, past surgical history and problem list.  ROS as in subjective above    Objective:  Alert and in no distress. Hemoglobin A1c 7.5    Assessment:   Diabetes mellitus without complication (HCC) - Plan: HgB A1c  Arthritis  Hyperlipidemia associated with type 2 diabetes mellitus (HCC)  Hypertension associated with diabetes (HCC)  Moderate nonproliferative diabetic retinopathy without macular edema associated with type 2 diabetes mellitus  Controlled type 2 diabetes mellitus with complication, without long-term current use of insulin (HCC)    Plan:   Diabetes Mellitus type 2: Education: Reviewed 'ABCs' of diabetes management (respective goals in parentheses):  A1C (<7), blood pressure (<130/80), and cholesterol (LDL <100)   Diabetes mellitus Type II, under good control.   Compliance at present is estimated to be good. Efforts to improve compliance (if necessary) will be  directed at increased exercise.    Blood pressure: normal blood pressure .   An ACE/ARBis currently part of their treatment regimen.   Dyslipidemia under good control. .  A statin is currently part of their treatment regimen.   Recommended she continue to take good care of herself    Follow up: 4 months

## 2015-03-18 ENCOUNTER — Other Ambulatory Visit: Payer: Self-pay | Admitting: Family Medicine

## 2015-04-26 DIAGNOSIS — E113391 Type 2 diabetes mellitus with moderate nonproliferative diabetic retinopathy without macular edema, right eye: Secondary | ICD-10-CM | POA: Diagnosis not present

## 2015-04-26 DIAGNOSIS — H353221 Exudative age-related macular degeneration, left eye, with active choroidal neovascularization: Secondary | ICD-10-CM | POA: Diagnosis not present

## 2015-04-26 DIAGNOSIS — E113393 Type 2 diabetes mellitus with moderate nonproliferative diabetic retinopathy without macular edema, bilateral: Secondary | ICD-10-CM | POA: Diagnosis not present

## 2015-04-26 DIAGNOSIS — H353132 Nonexudative age-related macular degeneration, bilateral, intermediate dry stage: Secondary | ICD-10-CM | POA: Diagnosis not present

## 2015-05-02 DIAGNOSIS — H353221 Exudative age-related macular degeneration, left eye, with active choroidal neovascularization: Secondary | ICD-10-CM | POA: Diagnosis not present

## 2015-05-09 ENCOUNTER — Other Ambulatory Visit: Payer: Self-pay | Admitting: Family Medicine

## 2015-06-03 ENCOUNTER — Other Ambulatory Visit: Payer: Self-pay | Admitting: Family Medicine

## 2015-06-07 DIAGNOSIS — H353221 Exudative age-related macular degeneration, left eye, with active choroidal neovascularization: Secondary | ICD-10-CM | POA: Diagnosis not present

## 2015-06-19 ENCOUNTER — Ambulatory Visit (INDEPENDENT_AMBULATORY_CARE_PROVIDER_SITE_OTHER): Payer: Medicare Other | Admitting: Family Medicine

## 2015-06-19 ENCOUNTER — Encounter: Payer: Self-pay | Admitting: Family Medicine

## 2015-06-19 VITALS — BP 130/80 | HR 78 | Ht 63.0 in | Wt 164.0 lb

## 2015-06-19 DIAGNOSIS — E1159 Type 2 diabetes mellitus with other circulatory complications: Secondary | ICD-10-CM | POA: Diagnosis not present

## 2015-06-19 DIAGNOSIS — E118 Type 2 diabetes mellitus with unspecified complications: Secondary | ICD-10-CM | POA: Diagnosis not present

## 2015-06-19 DIAGNOSIS — E113399 Type 2 diabetes mellitus with moderate nonproliferative diabetic retinopathy without macular edema, unspecified eye: Secondary | ICD-10-CM

## 2015-06-19 DIAGNOSIS — M1990 Unspecified osteoarthritis, unspecified site: Secondary | ICD-10-CM | POA: Diagnosis not present

## 2015-06-19 DIAGNOSIS — I152 Hypertension secondary to endocrine disorders: Secondary | ICD-10-CM

## 2015-06-19 DIAGNOSIS — E785 Hyperlipidemia, unspecified: Secondary | ICD-10-CM | POA: Diagnosis not present

## 2015-06-19 DIAGNOSIS — E1169 Type 2 diabetes mellitus with other specified complication: Secondary | ICD-10-CM | POA: Diagnosis not present

## 2015-06-19 DIAGNOSIS — I10 Essential (primary) hypertension: Secondary | ICD-10-CM

## 2015-06-19 DIAGNOSIS — H9113 Presbycusis, bilateral: Secondary | ICD-10-CM | POA: Diagnosis not present

## 2015-06-19 LAB — POCT UA - MICROALBUMIN
Albumin/Creatinine Ratio, Urine, POC: 39.3
Creatinine, POC: 39.9 mg/dL
Microalbumin Ur, POC: 15.7 mg/L

## 2015-06-19 LAB — POCT GLYCOSYLATED HEMOGLOBIN (HGB A1C): Hemoglobin A1C: 7.5

## 2015-06-19 NOTE — Progress Notes (Deleted)
  Subjective:    Patient ID: Kristin Cook, female    DOB: Mar 23, 1931, 82 y.o.   MRN: [id number]  Kristin Cook is a 82 y.o. female who presents for follow-up of Type 2 diabetes mellitus.  Patient is checking home blood sugars.   Home blood sugar records: 164 How often is blood sugars being checked: BID Current symptoms/problems none Daily foot checks: yes   Any foot concerns: no Last eye exam: has been 3 times already this year Exercise: {types:19826}  The following portions of the patient's history were reviewed and updated as appropriate: allergies, current medications, past medical history, past social history and problem list.  ROS as in subjective above.     Objective:    Physical Exam Alert and in no distress otherwise not examined.  There were no vitals taken for this visit.  Lab Review Diabetic Labs Latest Ref Rng 02/13/2015 10/10/2014 06/08/2014 02/06/2014 10/04/2013  HbA1c - 7.5 7.5 8.1 7.9 7.4  Chol 0 - 200 mg/dL - - 440 - 347  HDL >=42 mg/dL - - 84 - 68  Calc LDL 0 - 99 mg/dL - - 62 - 77  Triglycerides <150 mg/dL - - 53 - 71  Creatinine 0.50 - 1.10 mg/dL - - 5.95 - -   BP/Weight 02/13/2015 11/27/2014 10/10/2014 06/08/2014 02/06/2014  Systolic BP 122 168 120 120 120  Diastolic BP 80 90 70 70 70  Wt. (Lbs) 164.2 162.2 162 156.2 154  BMI 29.09 28.74 28.7 27.68 27.29   Foot/eye exam completion dates Latest Ref Rng 08/11/2013 05/10/2013  Eye Exam No Retinopathy No Retinopathy -  Foot Form Completion - - Done    Kristin Cook  reports that she has quit smoking. She has never used smokeless tobacco. She reports that she does not drink alcohol or use illicit drugs.     Assessment & Plan:    No diagnosis found.  1. Rx changes: {none:33079} 2. Education: Reviewed 'ABCs' of diabetes management (respective goals in parentheses):  A1C (<7), blood pressure (<130/80), and cholesterol (LDL <100). 3. Compliance at present is estimated to be {good/fair/poor:33178}. Efforts to improve  compliance (if necessary) will be directed at {compliance:16716}. 4. Follow up: {NUMBERS; 0-10:33138} {time:11}

## 2015-06-19 NOTE — Progress Notes (Signed)
Subjective:    Patient ID: Kristin Cook, female    DOB: 06-16-1931, 82 y.o.   MRN: [id number]  Kristin Cook is a 82 y.o. female who presents for follow-up of Type 2 diabetes mellitus.  Patient is checking home blood sugars.   Home blood sugar records: Kristin Cook reports blood sugars around 135-145 when she checks at both times of day. How often is blood sugars being checked: BID in the morning and 2 hrs after dinner Current symptoms/problems none Daily foot checks: yes   Any foot concerns: no Last eye exam: has been 3 times already this year.history of underlying macular degeneration Exercise: Walking at flea market with friends for 20 mins-1 hr a few times a week. Diet: She understands the importance of limiting carbohydrates and now makes sure to take protein with her meals instead of only carbs.    The following portions of the patient's history were reviewed and updated as appropriate: allergies, current medications, past medical history, past social history and problem list.  She also reports difficulty hearing.  She says that she cannot hear when watching television sometimes and especially has difficulty hearing in her left ear.  She has tried to clean her ears of ear wax with ear drops.    Not currently smoking and she drinks less than 1 alcoholic beverage / week.    ROS as in subjective above.     Objective:    Physical Exam Alert and in no distress otherwise not examined.  Blood pressure 130/80, pulse 78, height 5\' 3"  (1.6 m), weight 164 lb (74.39 kg).  Lab Review Diabetic Labs Latest Ref Rng 02/13/2015 10/10/2014 06/08/2014 02/06/2014 10/04/2013  HbA1c - 7.5 7.5 8.1 7.9 7.4  Chol 0 - 200 mg/dL - - 10/06/2013 - 154  HDL 008 mg/dL - - 84 - 68  Calc LDL 0 - 99 mg/dL - - 62 - 77  Triglycerides <150 mg/dL - - 53 - 71  Creatinine 0.50 - 1.10 mg/dL - - >=67 - -   BP/Weight 06/19/2015 02/13/2015 11/27/2014 10/10/2014 06/08/2014  Systolic BP 130 122 168 120 120  Diastolic BP 80 80  90 70 70  Wt. (Lbs) 164 164.2 162.2 162 156.2  BMI 29.06 29.09 28.74 28.7 27.68   Foot/eye exam completion dates Latest Ref Rng 08/11/2013 05/10/2013  Eye Exam No Retinopathy No Retinopathy -  Foot Form Completion - - Done    Kristin Cook  reports that she has quit smoking. She has never used smokeless tobacco. She reports that she does not drink alcohol or use illicit drugs.   A1c today 7.5   Ears:  Normal tympanic membrane without cerumen obstruction.  Gross hearing examination revealed bilateral hearing loss.    Hearing: Audiometry revealed bilateral hearing loss     Assessment & Plan:    Moderate nonproliferative diabetic retinopathy without macular edema associated with type 2 diabetes mellitus  Hypertension associated with diabetes (HCC)  Hyperlipidemia associated with type 2 diabetes mellitus (HCC)  Controlled type 2 diabetes mellitus with complication, without long-term current use of insulin (HCC) - Plan: POCT glycosylated hemoglobin (Hb A1C), POCT UA - Microalbumin  Arthritis  Presbycusis of both ears - Plan: Hearing screening, Ambulatory referral to Audiology, Hearing screening   1. Rx changes: none 2. Education: Reviewed 'ABCs' of diabetes management (respective goals in parentheses):  A1C (<7), blood pressure (<130/80), and cholesterol (LDL <100). 3. Compliance at present is estimated to be fair. Efforts to improve compliance (if necessary) will be directed  at increased exercise. 4. Follow up: 4 months  Hearing loss Referral to audiology for hearing aid evaluation.      History and physical exam conducted by Emerson Monte (Medical Student) in conjunction with Dr. Susann Givens.

## 2015-07-05 DIAGNOSIS — H353221 Exudative age-related macular degeneration, left eye, with active choroidal neovascularization: Secondary | ICD-10-CM | POA: Diagnosis not present

## 2015-08-03 ENCOUNTER — Other Ambulatory Visit: Payer: Self-pay | Admitting: Family Medicine

## 2015-08-08 DIAGNOSIS — H353221 Exudative age-related macular degeneration, left eye, with active choroidal neovascularization: Secondary | ICD-10-CM | POA: Diagnosis not present

## 2015-08-23 LAB — HM DIABETES EYE EXAM

## 2015-08-28 DIAGNOSIS — Z961 Presence of intraocular lens: Secondary | ICD-10-CM | POA: Diagnosis not present

## 2015-08-28 DIAGNOSIS — H35372 Puckering of macula, left eye: Secondary | ICD-10-CM | POA: Diagnosis not present

## 2015-08-28 DIAGNOSIS — E113313 Type 2 diabetes mellitus with moderate nonproliferative diabetic retinopathy with macular edema, bilateral: Secondary | ICD-10-CM | POA: Diagnosis not present

## 2015-08-28 DIAGNOSIS — H353132 Nonexudative age-related macular degeneration, bilateral, intermediate dry stage: Secondary | ICD-10-CM | POA: Diagnosis not present

## 2015-09-12 DIAGNOSIS — H353221 Exudative age-related macular degeneration, left eye, with active choroidal neovascularization: Secondary | ICD-10-CM | POA: Diagnosis not present

## 2015-09-12 DIAGNOSIS — H353132 Nonexudative age-related macular degeneration, bilateral, intermediate dry stage: Secondary | ICD-10-CM | POA: Diagnosis not present

## 2015-09-19 ENCOUNTER — Other Ambulatory Visit: Payer: Self-pay | Admitting: Family Medicine

## 2015-10-22 ENCOUNTER — Encounter: Payer: Self-pay | Admitting: Family Medicine

## 2015-10-22 ENCOUNTER — Ambulatory Visit (INDEPENDENT_AMBULATORY_CARE_PROVIDER_SITE_OTHER): Payer: Medicare Other | Admitting: Family Medicine

## 2015-10-22 VITALS — BP 126/70 | HR 68 | Ht 63.0 in | Wt 166.0 lb

## 2015-10-22 DIAGNOSIS — E113399 Type 2 diabetes mellitus with moderate nonproliferative diabetic retinopathy without macular edema, unspecified eye: Secondary | ICD-10-CM

## 2015-10-22 DIAGNOSIS — E1159 Type 2 diabetes mellitus with other circulatory complications: Secondary | ICD-10-CM | POA: Diagnosis not present

## 2015-10-22 DIAGNOSIS — E1169 Type 2 diabetes mellitus with other specified complication: Secondary | ICD-10-CM | POA: Diagnosis not present

## 2015-10-22 DIAGNOSIS — B351 Tinea unguium: Secondary | ICD-10-CM | POA: Insufficient documentation

## 2015-10-22 DIAGNOSIS — I10 Essential (primary) hypertension: Secondary | ICD-10-CM | POA: Diagnosis not present

## 2015-10-22 DIAGNOSIS — I152 Hypertension secondary to endocrine disorders: Secondary | ICD-10-CM

## 2015-10-22 DIAGNOSIS — E118 Type 2 diabetes mellitus with unspecified complications: Secondary | ICD-10-CM | POA: Diagnosis not present

## 2015-10-22 DIAGNOSIS — H9113 Presbycusis, bilateral: Secondary | ICD-10-CM

## 2015-10-22 DIAGNOSIS — E785 Hyperlipidemia, unspecified: Secondary | ICD-10-CM

## 2015-10-22 LAB — CBC WITH DIFFERENTIAL/PLATELET
Basophils Absolute: 70 cells/uL (ref 0–200)
Basophils Relative: 1 %
Eosinophils Absolute: 210 cells/uL (ref 15–500)
Eosinophils Relative: 3 %
HCT: 41.4 % (ref 35.0–45.0)
Hemoglobin: 14.6 g/dL (ref 11.7–15.5)
Lymphs Abs: 1260 cells/uL (ref 850–3900)
MCH: 30.8 pg (ref 27.0–33.0)
MCHC: 35.3 g/dL (ref 32.0–36.0)
MCV: 87.3 fL (ref 80.0–100.0)
MPV: 11.1 fL (ref 7.5–12.5)
Monocytes Absolute: 560 cells/uL (ref 200–950)
Monocytes Relative: 8 %
Neutro Abs: 4900 cells/uL (ref 1500–7800)
Neutrophils Relative %: 70 %
Platelets: 243 10*3/uL (ref 140–400)
RBC: 4.74 MIL/uL (ref 3.80–5.10)
RDW: 13.5 % (ref 11.0–15.0)
WBC: 7 10*3/uL (ref 4.0–10.5)

## 2015-10-22 LAB — COMPREHENSIVE METABOLIC PANEL
ALT: 14 U/L (ref 6–29)
AST: 17 U/L (ref 10–35)
Albumin: 4.6 g/dL (ref 3.6–5.1)
Alkaline Phosphatase: 61 U/L (ref 33–130)
BUN: 13 mg/dL (ref 7–25)
CO2: 26 mmol/L (ref 20–31)
Calcium: 9.7 mg/dL (ref 8.6–10.4)
Chloride: 94 mmol/L — ABNORMAL LOW (ref 98–110)
Creat: 0.72 mg/dL (ref 0.60–0.88)
Glucose, Bld: 154 mg/dL — ABNORMAL HIGH (ref 65–99)
Potassium: 3.8 mmol/L (ref 3.5–5.3)
Sodium: 133 mmol/L — ABNORMAL LOW (ref 135–146)
Total Bilirubin: 0.5 mg/dL (ref 0.2–1.2)
Total Protein: 7.2 g/dL (ref 6.1–8.1)

## 2015-10-22 LAB — POCT GLYCOSYLATED HEMOGLOBIN (HGB A1C): Hemoglobin A1C: 7.2

## 2015-10-22 LAB — LIPID PANEL
Cholesterol: 169 mg/dL (ref 125–200)
HDL: 106 mg/dL (ref >=46)
LDL Cholesterol: 53 mg/dL (ref <130)
Total CHOL/HDL Ratio: 1.6 Ratio (ref <=5.0)
Triglycerides: 50 mg/dL (ref <150)
VLDL: 10 mg/dL (ref <30)

## 2015-10-22 MED ORDER — ROSUVASTATIN CALCIUM 20 MG PO TABS: 20.0000 mg | ORAL_TABLET | Freq: Every day | ORAL | 3 refills | Status: DC

## 2015-10-22 MED ORDER — SAXAGLIPTIN HCL 2.5 MG PO TABS: ORAL_TABLET | ORAL | 3 refills | Status: DC

## 2015-10-22 MED ORDER — AMLODIPINE BESYLATE 5 MG PO TABS: 5.0000 mg | ORAL_TABLET | Freq: Every day | ORAL | 3 refills | Status: DC

## 2015-10-22 MED ORDER — LISINOPRIL-HYDROCHLOROTHIAZIDE 20-12.5 MG PO TABS: 1.0000 | ORAL_TABLET | Freq: Every day | ORAL | 3 refills | Status: DC

## 2015-10-22 MED ORDER — PIOGLITAZONE HCL-METFORMIN HCL 15-850 MG PO TABS: ORAL_TABLET | ORAL | 1 refills | Status: DC

## 2015-10-22 NOTE — Progress Notes (Signed)
  Subjective:    Patient ID: Kristin Cook, female    DOB: 06-28-1931, 82 y.o.   MRN: [id number]  Kristin Cook is a 82 y.o. female who presents for follow-up of Type 2 diabetes mellitus.  Patient is checking home blood sugars.   Home blood sugar records: 145 How often is blood sugars being checked: bid Current symptoms/problems None Daily foot checks: yes   Any foot concerns: none Last eye exam: 6/2017She is followed by ophthalmology for macular degeneration. Exercise:walking She continues on her diabetes medications as well as blood pressure and cholesterol meds per each is having no difficulty with them. In general life is going quite well for her. She does have a boyfriend and things are going well there as well. She has no other concerns or complaints. The following portions of the patient's history were reviewed and updated as appropriate: allergies, current medications, past medical history, past social history and problem list.  ROS as in subjective above.     Objective:    Physical Exam Alert and in no distress Foot's name is normal except for evidence of onychomycosis with thickening of the nails   Lab Review Diabetic Labs Latest Ref Rng & Units 10/22/2015 06/19/2015 02/13/2015 10/10/2014 06/08/2014  HbA1c - 7.2 7.5 7.5 7.5 8.1  Chol 0 - 200 mg/dL - - - - 352  HDL >=48 mg/dL - - - - 84  Calc LDL 0 - 99 mg/dL - - - - 62  Triglycerides <150 mg/dL - - - - 53  Creatinine 0.50 - 1.10 mg/dL - - - - 1.85   BP/Weight 10/22/2015 06/19/2015 02/13/2015 11/27/2014 10/10/2014  Systolic BP 126 130 122 168 120  Diastolic BP 70 80 80 90 70  Wt. (Lbs) 166 164 164.2 162.2 162  BMI 29.41 29.06 29.09 28.74 28.7   Foot/eye exam completion dates Latest Ref Rng & Units 10/22/2015 08/11/2013  Eye Exam No Retinopathy - No Retinopathy  Foot Form Completion - Done -   A1c is 7.2. Kristin Cook  reports that she has quit smoking. She has never used smokeless tobacco. She reports that she does not drink  alcohol or use drugs.     Assessment & Plan:    1. Rx changes: none 2. Education: Reviewed 'ABCs' of diabetes management (respective goals in parentheses):  A1C (<7), blood pressure (<130/80), and cholesterol (LDL <100). 3. Compliance at present is estimated to be good. Efforts to improve compliance (if necessary) will be directed at Continuing the present course. 4. Follow up: 4 months The nails are not giving her any trouble so I will leave this alone. She is comfortable with this.

## 2015-10-24 DIAGNOSIS — H353221 Exudative age-related macular degeneration, left eye, with active choroidal neovascularization: Secondary | ICD-10-CM | POA: Diagnosis not present

## 2015-11-28 DIAGNOSIS — H353221 Exudative age-related macular degeneration, left eye, with active choroidal neovascularization: Secondary | ICD-10-CM | POA: Diagnosis not present

## 2015-12-25 DIAGNOSIS — H353132 Nonexudative age-related macular degeneration, bilateral, intermediate dry stage: Secondary | ICD-10-CM | POA: Diagnosis not present

## 2015-12-25 DIAGNOSIS — E113492 Type 2 diabetes mellitus with severe nonproliferative diabetic retinopathy without macular edema, left eye: Secondary | ICD-10-CM | POA: Diagnosis not present

## 2015-12-25 DIAGNOSIS — H3563 Retinal hemorrhage, bilateral: Secondary | ICD-10-CM | POA: Diagnosis not present

## 2015-12-25 DIAGNOSIS — H353221 Exudative age-related macular degeneration, left eye, with active choroidal neovascularization: Secondary | ICD-10-CM | POA: Diagnosis not present

## 2016-01-29 DIAGNOSIS — E113393 Type 2 diabetes mellitus with moderate nonproliferative diabetic retinopathy without macular edema, bilateral: Secondary | ICD-10-CM | POA: Diagnosis not present

## 2016-01-29 DIAGNOSIS — E113492 Type 2 diabetes mellitus with severe nonproliferative diabetic retinopathy without macular edema, left eye: Secondary | ICD-10-CM | POA: Diagnosis not present

## 2016-01-29 DIAGNOSIS — H353221 Exudative age-related macular degeneration, left eye, with active choroidal neovascularization: Secondary | ICD-10-CM | POA: Diagnosis not present

## 2016-01-29 DIAGNOSIS — H353132 Nonexudative age-related macular degeneration, bilateral, intermediate dry stage: Secondary | ICD-10-CM | POA: Diagnosis not present

## 2016-02-26 ENCOUNTER — Encounter: Payer: Self-pay | Admitting: Family Medicine

## 2016-02-26 ENCOUNTER — Ambulatory Visit (INDEPENDENT_AMBULATORY_CARE_PROVIDER_SITE_OTHER): Payer: Medicare Other | Admitting: Family Medicine

## 2016-02-26 VITALS — BP 114/80 | HR 72 | Resp 14 | Ht 63.0 in | Wt 163.4 lb

## 2016-02-26 DIAGNOSIS — I10 Essential (primary) hypertension: Secondary | ICD-10-CM | POA: Diagnosis not present

## 2016-02-26 DIAGNOSIS — E113399 Type 2 diabetes mellitus with moderate nonproliferative diabetic retinopathy without macular edema, unspecified eye: Secondary | ICD-10-CM

## 2016-02-26 DIAGNOSIS — E1159 Type 2 diabetes mellitus with other circulatory complications: Secondary | ICD-10-CM | POA: Diagnosis not present

## 2016-02-26 DIAGNOSIS — H9113 Presbycusis, bilateral: Secondary | ICD-10-CM | POA: Diagnosis not present

## 2016-02-26 DIAGNOSIS — E1169 Type 2 diabetes mellitus with other specified complication: Secondary | ICD-10-CM | POA: Diagnosis not present

## 2016-02-26 DIAGNOSIS — I152 Hypertension secondary to endocrine disorders: Secondary | ICD-10-CM

## 2016-02-26 DIAGNOSIS — E118 Type 2 diabetes mellitus with unspecified complications: Secondary | ICD-10-CM

## 2016-02-26 DIAGNOSIS — E785 Hyperlipidemia, unspecified: Secondary | ICD-10-CM

## 2016-02-26 LAB — POCT GLYCOSYLATED HEMOGLOBIN (HGB A1C): Hemoglobin A1C: 7.5

## 2016-02-26 NOTE — Progress Notes (Signed)
  Subjective:    Patient ID: Kristin Cook, female    DOB: 16-Jul-1931, 82 y.o.   MRN: [id number]  Kristin Cook is a 82 y.o. female who presents for follow-up of Type 2 diabetes mellitus.  Patient is checking home blood sugars.   Home blood sugar records: 147 to 170 How often is blood sugars being checked: BID Current symptoms/problems none Daily foot checks: yes   Any foot concerns: thick nails Last eye exam: 6/17 groat every six weeks Dr.Rankin Exercise: not much She continues on her amlodipine and lisinopril/HCTZ. She also continues on Crestor without difficulty. Her diabetes meds are giving her no trouble. She continues to have difficulty hearing people speak. The following portions of the patient's history were reviewed and updated as appropriate: allergies, current medications, past medical history, past social history and problem list.  ROS as in subjective above.     Objective:    Physical Exam Alert and in no distress otherwise not examined.   Lab Review Diabetic Labs Latest Ref Rng & Units 10/22/2015 06/19/2015 02/13/2015 10/10/2014 06/08/2014  HbA1c - 7.2 7.5 7.5 7.5 8.1  Microalbumin mg/L - 15.7 - - -  Micro/Creat Ratio - - 39.3 - - -  Chol 125 - 200 mg/dL 115 - - - 726  HDL >=20 mg/dL 355 - - - 84  Calc LDL <130 mg/dL 53 - - - 62  Triglycerides <150 mg/dL 50 - - - 53  Creatinine 0.60 - 0.88 mg/dL 9.74 - - - 1.63   BP/Weight 10/22/2015 06/19/2015 02/13/2015 11/27/2014 10/10/2014  Systolic BP 126 130 122 168 120  Diastolic BP 70 80 80 90 70  Wt. (Lbs) 166 164 164.2 162.2 162  BMI 29.41 29.06 29.09 28.74 28.7   Foot/eye exam completion dates Latest Ref Rng & Units 10/22/2015 08/11/2013  Eye Exam No Retinopathy - No Retinopathy  Foot Form Completion - Done -  A1c is 7.5  Kristin Cook  reports that she has quit smoking. She has never used smokeless tobacco. She reports that she does not drink alcohol or use drugs.     Assessment & Plan:    Presbycusis of both  ears  Moderate nonproliferative diabetic retinopathy without macular edema associated with type 2 diabetes mellitus, unspecified laterality (HCC)  Hypertension associated with diabetes (HCC)  Hyperlipidemia associated with type 2 diabetes mellitus (HCC)  Controlled type 2 diabetes mellitus with complication, without long-term current use of insulin (HCC)    1. Rx changes: none 2. Education: Reviewed 'ABCs' of diabetes management (respective goals in parentheses):  A1C (<7), blood pressure (<130/80), and cholesterol (LDL <100). 3. Compliance at present is estimated to be good. Efforts to improve compliance (if necessary) will be directed at increased exercise. She states that she does have an exercise machine at home and plans to have her boyfriend set up for her. 4. Follow up: 4 months She is about to run out of her Actos plus. I will have pharmacy give her enough to last until the beginning of the year when she can get it at a reduced cost.

## 2016-03-13 DIAGNOSIS — H353221 Exudative age-related macular degeneration, left eye, with active choroidal neovascularization: Secondary | ICD-10-CM | POA: Diagnosis not present

## 2016-04-24 DIAGNOSIS — H353221 Exudative age-related macular degeneration, left eye, with active choroidal neovascularization: Secondary | ICD-10-CM | POA: Diagnosis not present

## 2016-05-28 DIAGNOSIS — H353221 Exudative age-related macular degeneration, left eye, with active choroidal neovascularization: Secondary | ICD-10-CM | POA: Diagnosis not present

## 2016-05-28 DIAGNOSIS — H353132 Nonexudative age-related macular degeneration, bilateral, intermediate dry stage: Secondary | ICD-10-CM | POA: Diagnosis not present

## 2016-06-02 ENCOUNTER — Other Ambulatory Visit: Payer: Self-pay | Admitting: Family Medicine

## 2016-06-30 DIAGNOSIS — E113492 Type 2 diabetes mellitus with severe nonproliferative diabetic retinopathy without macular edema, left eye: Secondary | ICD-10-CM | POA: Diagnosis not present

## 2016-06-30 DIAGNOSIS — H353221 Exudative age-related macular degeneration, left eye, with active choroidal neovascularization: Secondary | ICD-10-CM | POA: Diagnosis not present

## 2016-06-30 DIAGNOSIS — H353132 Nonexudative age-related macular degeneration, bilateral, intermediate dry stage: Secondary | ICD-10-CM | POA: Diagnosis not present

## 2016-07-01 ENCOUNTER — Encounter: Payer: Self-pay | Admitting: Family Medicine

## 2016-07-01 ENCOUNTER — Ambulatory Visit (INDEPENDENT_AMBULATORY_CARE_PROVIDER_SITE_OTHER): Payer: Medicare Other | Admitting: Family Medicine

## 2016-07-01 VITALS — BP 130/80 | HR 84 | Ht 63.0 in | Wt 156.0 lb

## 2016-07-01 DIAGNOSIS — M1990 Unspecified osteoarthritis, unspecified site: Secondary | ICD-10-CM

## 2016-07-01 DIAGNOSIS — E113399 Type 2 diabetes mellitus with moderate nonproliferative diabetic retinopathy without macular edema, unspecified eye: Secondary | ICD-10-CM | POA: Diagnosis not present

## 2016-07-01 DIAGNOSIS — E1169 Type 2 diabetes mellitus with other specified complication: Secondary | ICD-10-CM

## 2016-07-01 DIAGNOSIS — E118 Type 2 diabetes mellitus with unspecified complications: Secondary | ICD-10-CM | POA: Diagnosis not present

## 2016-07-01 DIAGNOSIS — H9113 Presbycusis, bilateral: Secondary | ICD-10-CM

## 2016-07-01 DIAGNOSIS — I10 Essential (primary) hypertension: Secondary | ICD-10-CM | POA: Diagnosis not present

## 2016-07-01 DIAGNOSIS — Z0000 Encounter for general adult medical examination without abnormal findings: Secondary | ICD-10-CM | POA: Diagnosis not present

## 2016-07-01 DIAGNOSIS — Z23 Encounter for immunization: Secondary | ICD-10-CM | POA: Diagnosis not present

## 2016-07-01 DIAGNOSIS — E785 Hyperlipidemia, unspecified: Secondary | ICD-10-CM

## 2016-07-01 DIAGNOSIS — H3530 Unspecified macular degeneration: Secondary | ICD-10-CM

## 2016-07-01 DIAGNOSIS — E1159 Type 2 diabetes mellitus with other circulatory complications: Secondary | ICD-10-CM

## 2016-07-01 DIAGNOSIS — I152 Hypertension secondary to endocrine disorders: Secondary | ICD-10-CM

## 2016-07-01 LAB — POCT GLYCOSYLATED HEMOGLOBIN (HGB A1C): Hemoglobin A1C: 7.6

## 2016-07-01 NOTE — Progress Notes (Signed)
Subjective:   HPI  Kristin Cook is a 83 y.o. female who presents for Chief Complaint  Patient presents with  . Medicare Wellness  . Annual Exam  . Diabetes    Medical care team includes: Wyatt Haste, MD here for primary care Dr.Rankin Dr.Groat Preventative care:JCL Last ophthalmology visit: 6/18 Groat Last dental visit:N/A Last colonoscopy:07/27/02 Last mammogram: 05/08/08 Last pap: N/A Last EKG:03/18/13 Last labs: 10/22/15 Prior vaccinations: TD or Tdap: 08/05/12 Influenza:12/10/15 Pneumococcal:23:  05/10/13,09/07/06 Shingles/Zostavax:03/23/07   Advanced directive:Yes. Asked to bring in a copy.  Concerns: Overall she has no particular concerns or complaints. She continues to do quite nicely on her lab amlodipine, lisinopril/HCTZ for her blood pressure. She is also on a 3 drug regimen for her diabetes. She takes Crestor for her hypertension. States her blood sugars run in the 150 range. She does keep her self quite active but is not involved in a regular exercise program. Does not smoke or drink. She does have a boyfriend who apparently is having some medical issues of his own. She does live in a basement apartment belonging to her son. This is working out quite well. She does have underlying presbycusis but is presently not using hearing aids. Her arthritis gets her very little difficulty.  Reviewed their medical, surgical, family, social, medication, and allergy history and updated chart as appropriate.  Past Medical History:  Diagnosis Date  . Arthritis   . Cataract   . Cystocele   . Dementia    forgetfulness  . Diabetes mellitus   . Dyslipidemia   . History of colonic polyps   . Hx MRSA infection   . Hyperlipidemia   . Hypertension   . Mental disorder   . Neuropathic pain of lower extremity   . OAB (overactive bladder)   . Shortness of breath    with exertion    Past Surgical History:  Procedure Laterality Date  . ABDOMINAL HYSTERECTOMY    . EYE  SURGERY Bilateral    Cataract  . PARS PLANA VITRECTOMY Left 03/15/2013   Procedure: PARS PLANA VITRECTOMY WITH 25 GAUGE LEFT EYE with Endolaser;  Surgeon: Hurman Horn, MD;  Location: [facility];  Service: Ophthalmology;  Laterality: Left;  [facility] Kitchen VAGINAL PROLAPSE REPAIR  2007   Dr. Gaetano Net    Social History   Social History  . Marital status: Single    Spouse name: N/A  . Number of children: N/A  . Years of education: N/A   Occupational History  . Not on file.   Social History Main Topics  . Smoking status: Former Research scientist (life sciences)  . Smokeless tobacco: Never Used     Comment: Several years ago  . Alcohol use No  . Drug use: No  . Sexual activity: Not Currently   Other Topics Concern  . Not on file   Social History Narrative  . No narrative on file    Family History  Problem Relation Age of Onset  . Diabetes Neg Hx   . Hypertension Neg Hx      Current Outpatient Prescriptions:  .  amLODipine (NORVASC) 5 MG tablet, Take 1 tablet (5 mg total) by mouth daily., Disp: 90 tablet, Rfl: 3 .  diphenhydramine-acetaminophen (TYLENOL PM) 25-500 MG TABS, Take 1 tablet by mouth at bedtime as needed., Disp: , Rfl:  .  lisinopril-hydrochlorothiazide (PRINZIDE,ZESTORETIC) 20-12.5 MG tablet, Take 1 tablet by mouth daily., Disp: 90 tablet, Rfl: 3 .  ONE TOUCH ULTRA TEST test strip, TEST BLOOD SUGAR TWICE DAILY, Disp: 100  each, Rfl: 3 .  pioglitazone-metformin (ACTOPLUS MET) 15-850 MG tablet, TAKE 1 TABLET BY MOUTH TWICE DAILY WITH A MEAL., Disp: 180 tablet, Rfl: 1 .  rosuvastatin (CRESTOR) 20 MG tablet, Take 1 tablet (20 mg total) by mouth daily., Disp: 90 tablet, Rfl: 3 .  saxagliptin HCl (ONGLYZA) 2.5 MG TABS tablet, TAKE 1 TABLET(2.5 MG) BY MOUTH DAILY, Disp: 90 tablet, Rfl: 3  Allergies  Allergen Reactions  . Penicillins Swelling  . Sulfa Antibiotics Swelling    Weak        Review of Systems Constitutional: -fever, -chills, -sweats, -unexpected weight change, -decreased appetite,  -fatigue Otherwise negative   Objective:   General appearance: alert, no distress, WD/WN, Caucasian female Skin: Normal HEENT: normocephalic, conjunctiva/corneas normal, sclerae anicteric, PERRLA, EOMi, nares patent, no discharge or erythema, pharynx normal Oral cavity: MMM, tongue normal, teeth normal Neck: supple, no lymphadenopathy, no thyromegaly, no masses, normal ROM, no bruits Chest: non tender, normal shape and expansion Heart: RRR, normal S1, S2, no murmurs Lungs: CTA bilaterally, no wheezes, rhonchi, or rales Abdomen: +bs, soft, non tender, non distended, no masses, no hepatomegaly, no splenomegaly, no bruits Back: non tender, normal ROM, no scoliosis Musculoskeletal: upper extremities non tender, no obvious deformity, normal ROM throughout, lower extremities non tender, no obvious deformity, normal ROM throughout Extremities: no edema, no cyanosis, no clubbing Pulses: 2+ symmetric, upper and lower extremities, normal cap refill Neurological: alert, oriented x 3, CN2-12 intact, strength normal upper extremities and lower extremities, sensation normal throughout, DTRs 2+ throughout, no cerebellar signs, gait normal Psychiatric: normal affect, behavior normal, pleasant  A1c 7.6  Assessment and Plan :    Controlled type 2 diabetes mellitus with complication, without long-term current use of insulin (HCC) - Plan: HgB A1c  Need for vaccination against Streptococcus pneumoniae - Plan: Pneumococcal conjugate vaccine 13-valent IM  Presbycusis of both ears  Moderate nonproliferative diabetic retinopathy without macular edema associated with type 2 diabetes mellitus, unspecified laterality ([place])  Hypertension associated with diabetes ([place])  Hyperlipidemia associated with type 2 diabetes mellitus (HCC)  Arthritis  Macular degeneration of left eye, unspecified type Overall she is doing quite well on the above diagnoses and on the present medication regimen. EScription was written  for her to get the shingles vaccine. Otherwise I encouraged her to continue to take excellent care of herself.  Physical exam - discussed and counseled on healthy lifestyle, diet, exercise, preventative care, vaccinations, sick and well care, proper use of emergency dept and after hours care, and addressed their concerns.

## 2016-07-14 DIAGNOSIS — H353221 Exudative age-related macular degeneration, left eye, with active choroidal neovascularization: Secondary | ICD-10-CM | POA: Diagnosis not present

## 2016-07-29 ENCOUNTER — Ambulatory Visit: Payer: Medicare Other | Admitting: Family Medicine

## 2016-08-06 ENCOUNTER — Other Ambulatory Visit: Payer: Self-pay | Admitting: Family Medicine

## 2016-08-06 DIAGNOSIS — E118 Type 2 diabetes mellitus with unspecified complications: Secondary | ICD-10-CM

## 2016-08-25 DIAGNOSIS — Z961 Presence of intraocular lens: Secondary | ICD-10-CM | POA: Diagnosis not present

## 2016-08-25 DIAGNOSIS — H1851 Endothelial corneal dystrophy: Secondary | ICD-10-CM | POA: Diagnosis not present

## 2016-08-25 DIAGNOSIS — E113291 Type 2 diabetes mellitus with mild nonproliferative diabetic retinopathy without macular edema, right eye: Secondary | ICD-10-CM | POA: Diagnosis not present

## 2016-08-25 DIAGNOSIS — H35372 Puckering of macula, left eye: Secondary | ICD-10-CM | POA: Diagnosis not present

## 2016-08-25 DIAGNOSIS — E113412 Type 2 diabetes mellitus with severe nonproliferative diabetic retinopathy with macular edema, left eye: Secondary | ICD-10-CM | POA: Diagnosis not present

## 2016-08-25 LAB — HM DIABETES EYE EXAM

## 2016-08-29 DIAGNOSIS — H353132 Nonexudative age-related macular degeneration, bilateral, intermediate dry stage: Secondary | ICD-10-CM | POA: Diagnosis not present

## 2016-08-29 DIAGNOSIS — H353222 Exudative age-related macular degeneration, left eye, with inactive choroidal neovascularization: Secondary | ICD-10-CM | POA: Diagnosis not present

## 2016-08-29 DIAGNOSIS — H35043 Retinal micro-aneurysms, unspecified, bilateral: Secondary | ICD-10-CM | POA: Diagnosis not present

## 2016-08-29 DIAGNOSIS — E113492 Type 2 diabetes mellitus with severe nonproliferative diabetic retinopathy without macular edema, left eye: Secondary | ICD-10-CM | POA: Diagnosis not present

## 2016-09-18 ENCOUNTER — Encounter: Payer: Self-pay | Admitting: Family Medicine

## 2016-09-29 ENCOUNTER — Ambulatory Visit (INDEPENDENT_AMBULATORY_CARE_PROVIDER_SITE_OTHER): Payer: Medicare Other | Admitting: Family Medicine

## 2016-09-29 ENCOUNTER — Encounter: Payer: Self-pay | Admitting: Family Medicine

## 2016-09-29 VITALS — BP 132/80 | HR 64 | Ht 63.0 in | Wt 155.0 lb

## 2016-09-29 DIAGNOSIS — I152 Hypertension secondary to endocrine disorders: Secondary | ICD-10-CM

## 2016-09-29 DIAGNOSIS — H9113 Presbycusis, bilateral: Secondary | ICD-10-CM

## 2016-09-29 DIAGNOSIS — E1159 Type 2 diabetes mellitus with other circulatory complications: Secondary | ICD-10-CM | POA: Diagnosis not present

## 2016-09-29 DIAGNOSIS — I10 Essential (primary) hypertension: Secondary | ICD-10-CM | POA: Diagnosis not present

## 2016-09-29 DIAGNOSIS — E785 Hyperlipidemia, unspecified: Secondary | ICD-10-CM | POA: Diagnosis not present

## 2016-09-29 DIAGNOSIS — B351 Tinea unguium: Secondary | ICD-10-CM

## 2016-09-29 DIAGNOSIS — E118 Type 2 diabetes mellitus with unspecified complications: Secondary | ICD-10-CM | POA: Diagnosis not present

## 2016-09-29 DIAGNOSIS — E113412 Type 2 diabetes mellitus with severe nonproliferative diabetic retinopathy with macular edema, left eye: Secondary | ICD-10-CM

## 2016-09-29 DIAGNOSIS — E1169 Type 2 diabetes mellitus with other specified complication: Secondary | ICD-10-CM

## 2016-09-29 DIAGNOSIS — E113411 Type 2 diabetes mellitus with severe nonproliferative diabetic retinopathy with macular edema, right eye: Secondary | ICD-10-CM | POA: Diagnosis not present

## 2016-09-29 DIAGNOSIS — H3530 Unspecified macular degeneration: Secondary | ICD-10-CM | POA: Diagnosis not present

## 2016-09-29 LAB — CBC WITH DIFFERENTIAL/PLATELET
Basophils Absolute: 0 cells/uL (ref 0–200)
Basophils Relative: 0 %
Eosinophils Absolute: 136 cells/uL (ref 15–500)
Eosinophils Relative: 2 %
HCT: 40.9 % (ref 35.0–45.0)
Hemoglobin: 14.2 g/dL (ref 11.7–15.5)
Lymphocytes Relative: 17 %
Lymphs Abs: 1156 cells/uL (ref 850–3900)
MCH: 30.7 pg (ref 27.0–33.0)
MCHC: 34.7 g/dL (ref 32.0–36.0)
MCV: 88.3 fL (ref 80.0–100.0)
MPV: 10 fL (ref 7.5–12.5)
Monocytes Absolute: 544 cells/uL (ref 200–950)
Monocytes Relative: 8 %
Neutro Abs: 4964 cells/uL (ref 1500–7800)
Neutrophils Relative %: 73 %
Platelets: 234 10*3/uL (ref 140–400)
RBC: 4.63 MIL/uL (ref 3.80–5.10)
RDW: 13.6 % (ref 11.0–15.0)
WBC: 6.8 10*3/uL (ref 4.0–10.5)

## 2016-09-29 LAB — COMPREHENSIVE METABOLIC PANEL
ALT: 11 U/L (ref 6–29)
AST: 15 U/L (ref 10–35)
Albumin: 4.4 g/dL (ref 3.6–5.1)
Alkaline Phosphatase: 54 U/L (ref 33–130)
BUN: 10 mg/dL (ref 7–25)
CO2: 27 mmol/L (ref 20–31)
Calcium: 9.2 mg/dL (ref 8.6–10.4)
Chloride: 90 mmol/L — ABNORMAL LOW (ref 98–110)
Creat: 0.7 mg/dL (ref 0.60–0.88)
Glucose, Bld: 162 mg/dL — ABNORMAL HIGH (ref 65–99)
Potassium: 4.2 mmol/L (ref 3.5–5.3)
Sodium: 127 mmol/L — ABNORMAL LOW (ref 135–146)
Total Bilirubin: 0.5 mg/dL (ref 0.2–1.2)
Total Protein: 6.7 g/dL (ref 6.1–8.1)

## 2016-09-29 LAB — LIPID PANEL
Cholesterol: 146 mg/dL (ref <200)
HDL: 93 mg/dL (ref >50)
LDL Cholesterol: 43 mg/dL (ref <100)
Total CHOL/HDL Ratio: 1.6 Ratio (ref <5.0)
Triglycerides: 51 mg/dL (ref <150)
VLDL: 10 mg/dL (ref <30)

## 2016-09-29 LAB — POCT GLYCOSYLATED HEMOGLOBIN (HGB A1C): Hemoglobin A1C: 7.1

## 2016-09-29 MED ORDER — PIOGLITAZONE HCL-METFORMIN HCL 15-850 MG PO TABS: ORAL_TABLET | ORAL | 1 refills | Status: DC

## 2016-09-29 MED ORDER — AMLODIPINE BESYLATE 5 MG PO TABS: 5.0000 mg | ORAL_TABLET | Freq: Every day | ORAL | 3 refills | Status: DC

## 2016-09-29 MED ORDER — SAXAGLIPTIN HCL 2.5 MG PO TABS: ORAL_TABLET | ORAL | 3 refills | Status: DC

## 2016-09-29 MED ORDER — LISINOPRIL-HYDROCHLOROTHIAZIDE 20-12.5 MG PO TABS: 1.0000 | ORAL_TABLET | Freq: Every day | ORAL | 3 refills | Status: DC

## 2016-09-29 MED ORDER — ROSUVASTATIN CALCIUM 20 MG PO TABS: 20.0000 mg | ORAL_TABLET | Freq: Every day | ORAL | 3 refills | Status: DC

## 2016-09-29 NOTE — Progress Notes (Signed)
Subjective:    Patient ID: Kristin Cook, female    DOB: May 02, 1931, 83 y.o.   MRN: [id number]  Kristin Cook is a 83 y.o. female who presents for follow-up of Type 2 diabetes mellitus.  Patient is checking home blood sugars.   Home blood sugar records: BGs consistently in an acceptable range How often is blood sugars being checked: Daily Current symptoms/problems include none and have been unchanged. Daily foot checks: No, usually weekly   Any foot concerns: No Last eye exam: Within the last several months Exercise: The patient does not participate in regular exercise at present. She continues have difficulty with her hearing is now interested in pursuing this. She continues on her. She also takes Onglyza Glitazone/metformin for her diabetes. She is also taking amlodipine and lisinopril/HCTZ. She is having no difficulty with Crestor. The following portions of the patient's history were reviewed and updated as appropriate: allergies, current medications, past medical history, past social history and problem list.  ROS as in subjective above.     Objective:    Physical Exam Alert and in no distress otherwise not examined.Cardiac exam shows regular rhythm without murmurs or gallops. Lungs are clear to auscultation. Foot exam recorded and does show evidence of thickening of the nails.  Blood pressure 132/80, pulse 64, height _0  (1.6 m), weight 155 lb (70.3 kg), SpO2 98 %.  Lab Review Diabetic Labs Latest Ref Rng & Units 09/29/2016 07/01/2016 02/26/2016 10/22/2015 06/19/2015  HbA1c - 7.1 7.6 7.5 7.2 7.5  Microalbumin mg/L - - - - 15.7  Micro/Creat Ratio - - - - - 39.3  Chol 125 - 200 mg/dL - - - 169 -  HDL >=46 mg/dL - - - 106 -  Calc LDL <130 mg/dL - - - 53 -  Triglycerides <150 mg/dL - - - 50 -  Creatinine 0.60 - 0.88 mg/dL - - - 0.72 -   BP/Weight 09/29/2016 07/01/2016 02/26/2016 10/22/2015 0/11/2328  Systolic BP 076 226 333 545 625  Diastolic BP 80 80 80 70 80  Wt. (Lbs) 155  156 163.4 166 164  BMI 27.46 27.63 28.95 29.41 29.06   Foot/eye exam completion dates Latest Ref Rng & Units 09/29/2016 08/25/2016  Eye Exam No Retinopathy - Retinopathy(A)  Foot Form Completion - Done -  Foot exam is recorded A1c is 7.1 Kristin Cook  reports that she has quit smoking. She has never used smokeless tobacco. She reports that she does not drink alcohol or use drugs.     Assessment & Plan:    Controlled type 2 diabetes mellitus with complication, without long-term current use of insulin ([place]) - Plan: POCT A1C, CBC with Differential/Platelet, Comprehensive metabolic panel, Lipid panel, saxagliptin HCl (ONGLYZA) 2.5 MG TABS tablet, pioglitazone-metformin (ACTOPLUS MET) 15-850 MG tablet  Severe nonproliferative diabetic retinopathy of right eye with macular edema associated with type 2 diabetes mellitus (HCC)  Macular degeneration of left eye, unspecified type  Hypertension associated with diabetes ([place]) - Plan: CBC with Differential/Platelet, Comprehensive metabolic panel, lisinopril-hydrochlorothiazide (PRINZIDE,ZESTORETIC) 20-12.5 MG tablet, amLODipine (NORVASC) 5 MG tablet  Hyperlipidemia associated with type 2 diabetes mellitus ([place]) - Plan: Lipid panel, rosuvastatin (CRESTOR) 20 MG tablet  Presbycusis of both ears  Onychomycosis  Severe nonproliferative diabetic retinopathy of left eye with macular edema associated with type 2 diabetes mellitus ([place]) - Plan: CBC with Differential/Platelet, Comprehensive metabolic panel, Lipid panel  1. Rx changes: none 2. Education: Reviewed 'ABCs' of diabetes management (respective goals in parentheses):  A1C (<7), blood pressure (<  130/80), and cholesterol (LDL <100). 3. Compliance at present is estimated to be good. Efforts to improve compliance (if necessary) will be directed at increased exercise. 4. Follow up: 4 months

## 2016-09-29 NOTE — Patient Instructions (Signed)
20 minutes of something physical every day even if

## 2016-10-09 DIAGNOSIS — E113391 Type 2 diabetes mellitus with moderate nonproliferative diabetic retinopathy without macular edema, right eye: Secondary | ICD-10-CM | POA: Diagnosis not present

## 2016-10-09 DIAGNOSIS — H353222 Exudative age-related macular degeneration, left eye, with inactive choroidal neovascularization: Secondary | ICD-10-CM | POA: Diagnosis not present

## 2016-10-09 DIAGNOSIS — E113492 Type 2 diabetes mellitus with severe nonproliferative diabetic retinopathy without macular edema, left eye: Secondary | ICD-10-CM | POA: Diagnosis not present

## 2016-10-09 DIAGNOSIS — H353132 Nonexudative age-related macular degeneration, bilateral, intermediate dry stage: Secondary | ICD-10-CM | POA: Diagnosis not present

## 2016-10-14 ENCOUNTER — Other Ambulatory Visit: Payer: Medicare Other

## 2016-10-14 DIAGNOSIS — E871 Hypo-osmolality and hyponatremia: Secondary | ICD-10-CM | POA: Diagnosis not present

## 2016-10-14 LAB — COMPREHENSIVE METABOLIC PANEL
ALT: 11 U/L (ref 6–29)
AST: 14 U/L (ref 10–35)
Albumin: 4.4 g/dL (ref 3.6–5.1)
Alkaline Phosphatase: 57 U/L (ref 33–130)
BUN: 14 mg/dL (ref 7–25)
CO2: 25 mmol/L (ref 20–32)
Calcium: 9.7 mg/dL (ref 8.6–10.4)
Chloride: 91 mmol/L — ABNORMAL LOW (ref 98–110)
Creat: 0.76 mg/dL (ref 0.60–0.88)
Glucose, Bld: 194 mg/dL — ABNORMAL HIGH (ref 65–99)
Potassium: 4.3 mmol/L (ref 3.5–5.3)
Sodium: 130 mmol/L — ABNORMAL LOW (ref 135–146)
Total Bilirubin: 0.4 mg/dL (ref 0.2–1.2)
Total Protein: 6.8 g/dL (ref 6.1–8.1)

## 2016-10-16 DIAGNOSIS — H903 Sensorineural hearing loss, bilateral: Secondary | ICD-10-CM | POA: Diagnosis not present

## 2016-10-21 DIAGNOSIS — E113393 Type 2 diabetes mellitus with moderate nonproliferative diabetic retinopathy without macular edema, bilateral: Secondary | ICD-10-CM | POA: Diagnosis not present

## 2016-10-21 DIAGNOSIS — E113492 Type 2 diabetes mellitus with severe nonproliferative diabetic retinopathy without macular edema, left eye: Secondary | ICD-10-CM | POA: Diagnosis not present

## 2016-10-21 DIAGNOSIS — H353221 Exudative age-related macular degeneration, left eye, with active choroidal neovascularization: Secondary | ICD-10-CM | POA: Diagnosis not present

## 2016-11-27 DIAGNOSIS — H353132 Nonexudative age-related macular degeneration, bilateral, intermediate dry stage: Secondary | ICD-10-CM | POA: Diagnosis not present

## 2016-11-27 DIAGNOSIS — E113393 Type 2 diabetes mellitus with moderate nonproliferative diabetic retinopathy without macular edema, bilateral: Secondary | ICD-10-CM | POA: Diagnosis not present

## 2016-11-27 DIAGNOSIS — H353221 Exudative age-related macular degeneration, left eye, with active choroidal neovascularization: Secondary | ICD-10-CM | POA: Diagnosis not present

## 2016-11-27 DIAGNOSIS — E113492 Type 2 diabetes mellitus with severe nonproliferative diabetic retinopathy without macular edema, left eye: Secondary | ICD-10-CM | POA: Diagnosis not present

## 2016-12-25 DIAGNOSIS — H353221 Exudative age-related macular degeneration, left eye, with active choroidal neovascularization: Secondary | ICD-10-CM | POA: Diagnosis not present

## 2017-01-15 DIAGNOSIS — H353132 Nonexudative age-related macular degeneration, bilateral, intermediate dry stage: Secondary | ICD-10-CM | POA: Diagnosis not present

## 2017-01-15 DIAGNOSIS — H353221 Exudative age-related macular degeneration, left eye, with active choroidal neovascularization: Secondary | ICD-10-CM | POA: Diagnosis not present

## 2017-02-03 ENCOUNTER — Ambulatory Visit (INDEPENDENT_AMBULATORY_CARE_PROVIDER_SITE_OTHER): Payer: Medicare Other | Admitting: Family Medicine

## 2017-02-03 ENCOUNTER — Encounter: Payer: Self-pay | Admitting: Family Medicine

## 2017-02-03 VITALS — BP 142/80 | HR 79 | Resp 16 | Wt 155.6 lb

## 2017-02-03 DIAGNOSIS — I10 Essential (primary) hypertension: Secondary | ICD-10-CM | POA: Diagnosis not present

## 2017-02-03 DIAGNOSIS — E785 Hyperlipidemia, unspecified: Secondary | ICD-10-CM | POA: Diagnosis not present

## 2017-02-03 DIAGNOSIS — E113411 Type 2 diabetes mellitus with severe nonproliferative diabetic retinopathy with macular edema, right eye: Secondary | ICD-10-CM

## 2017-02-03 DIAGNOSIS — H3530 Unspecified macular degeneration: Secondary | ICD-10-CM | POA: Diagnosis not present

## 2017-02-03 DIAGNOSIS — E118 Type 2 diabetes mellitus with unspecified complications: Secondary | ICD-10-CM

## 2017-02-03 DIAGNOSIS — E1169 Type 2 diabetes mellitus with other specified complication: Secondary | ICD-10-CM

## 2017-02-03 DIAGNOSIS — E1159 Type 2 diabetes mellitus with other circulatory complications: Secondary | ICD-10-CM

## 2017-02-03 DIAGNOSIS — H9113 Presbycusis, bilateral: Secondary | ICD-10-CM

## 2017-02-03 DIAGNOSIS — I152 Hypertension secondary to endocrine disorders: Secondary | ICD-10-CM

## 2017-02-03 LAB — POCT UA - MICROALBUMIN
Albumin/Creatinine Ratio, Urine, POC: 123.3
Creatinine, POC: 59.7 mg/dL
Microalbumin Ur, POC: 73.6 mg/L

## 2017-02-03 LAB — POCT GLYCOSYLATED HEMOGLOBIN (HGB A1C): Hemoglobin A1C: 7.3

## 2017-02-03 NOTE — Progress Notes (Signed)
  Subjective:    Patient ID: Kristin Cook, female    DOB: 1931/04/27, 83 y.o.   MRN: [id number]  CHOLE DRIVER is a 83 y.o. female who presents for follow-up of Type 2 diabetes mellitus.  Patient is checking home blood sugars.   Home blood sugar records: BGs range between 135 and 180 How often is blood sugars being checked: daily  Current symptoms/problems include none and have been unchanged. Daily foot checks: yes   Any foot concerns: thick toenails  Last eye exam: [facility]- within 5 weeks.  Exercise: very little.  She continues on amlodipine and lisinopril/HCTZ for her blood pressure.  She is also taking Actos plus and saxagliptin for her diabetes.  Continues on Crestor for her hypertension.  She does wear hearing aids.  She follows up regularly with her eye doctor due to her ocular issues. The following portions of the patient's history were reviewed and updated as appropriate: allergies, current medications, past medical history, past social history and problem list.  ROS as in subjective above.     Objective:    Physical Exam Alert and in no distress otherwise not examined.   Lab Review Diabetic Labs Latest Ref Rng & Units 10/14/2016 09/29/2016 07/01/2016 02/26/2016 10/22/2015  HbA1c - - 7.1 7.6 7.5 7.2  Microalbumin mg/L - - - - -  Micro/Creat Ratio - - - - - -  Chol <200 mg/dL - 694 - - 503  HDL >88 mg/dL - 93 - - 828  Calc LDL <100 mg/dL - 43 - - 53  Triglycerides <150 mg/dL - 51 - - 50  Creatinine 0.60 - 0.88 mg/dL 0.03 4.91 - - 7.91   BP/Weight 09/29/2016 07/01/2016 02/26/2016 10/22/2015 06/19/2015  Systolic BP 132 130 114 126 130  Diastolic BP 80 80 80 70 80  Wt. (Lbs) 155 156 163.4 166 164  BMI 27.46 27.63 28.95 29.41 29.06   Foot/eye exam completion dates Latest Ref Rng & Units 09/29/2016 08/25/2016  Eye Exam No Retinopathy - Retinopathy(A)  Foot Form Completion - Done -  A1c is 7.3  Barbette  reports that she has quit smoking. she has never  used smokeless tobacco. She reports that she does not drink alcohol or use drugs.     Assessment & Plan:    Controlled type 2 diabetes mellitus with complication, without long-term current use of insulin (HCC)  Severe nonproliferative diabetic retinopathy of right eye with macular edema associated with type 2 diabetes mellitus (HCC)  Macular degeneration of left eye, unspecified type  Hypertension associated with diabetes (HCC)  Hyperlipidemia associated with type 2 diabetes mellitus (HCC)  Presbycusis of both ears   1. Rx changes: none 2. Education: Reviewed 'ABCs' of diabetes management (respective goals in parentheses):  A1C (<7), blood pressure (<130/80), and cholesterol (LDL <100). 3. Compliance at present is estimated to be good. Efforts to improve compliance (if necessary) will be directed at increased exercise. 4. Follow up: 4 months  Overall she seems to be holding her own.

## 2017-02-19 DIAGNOSIS — E113492 Type 2 diabetes mellitus with severe nonproliferative diabetic retinopathy without macular edema, left eye: Secondary | ICD-10-CM | POA: Diagnosis not present

## 2017-02-19 DIAGNOSIS — H353221 Exudative age-related macular degeneration, left eye, with active choroidal neovascularization: Secondary | ICD-10-CM | POA: Diagnosis not present

## 2017-03-16 ENCOUNTER — Other Ambulatory Visit: Payer: Self-pay | Admitting: Family Medicine

## 2017-03-30 DIAGNOSIS — E113492 Type 2 diabetes mellitus with severe nonproliferative diabetic retinopathy without macular edema, left eye: Secondary | ICD-10-CM | POA: Diagnosis not present

## 2017-03-30 DIAGNOSIS — H353132 Nonexudative age-related macular degeneration, bilateral, intermediate dry stage: Secondary | ICD-10-CM | POA: Diagnosis not present

## 2017-03-30 DIAGNOSIS — E113393 Type 2 diabetes mellitus with moderate nonproliferative diabetic retinopathy without macular edema, bilateral: Secondary | ICD-10-CM | POA: Diagnosis not present

## 2017-03-30 DIAGNOSIS — H353221 Exudative age-related macular degeneration, left eye, with active choroidal neovascularization: Secondary | ICD-10-CM | POA: Diagnosis not present

## 2017-05-11 DIAGNOSIS — E113393 Type 2 diabetes mellitus with moderate nonproliferative diabetic retinopathy without macular edema, bilateral: Secondary | ICD-10-CM | POA: Diagnosis not present

## 2017-05-11 DIAGNOSIS — E113492 Type 2 diabetes mellitus with severe nonproliferative diabetic retinopathy without macular edema, left eye: Secondary | ICD-10-CM | POA: Diagnosis not present

## 2017-05-11 DIAGNOSIS — H353132 Nonexudative age-related macular degeneration, bilateral, intermediate dry stage: Secondary | ICD-10-CM | POA: Diagnosis not present

## 2017-05-11 DIAGNOSIS — H353221 Exudative age-related macular degeneration, left eye, with active choroidal neovascularization: Secondary | ICD-10-CM | POA: Diagnosis not present

## 2017-05-24 ENCOUNTER — Other Ambulatory Visit: Payer: Self-pay | Admitting: Family Medicine

## 2017-05-24 DIAGNOSIS — E118 Type 2 diabetes mellitus with unspecified complications: Secondary | ICD-10-CM

## 2017-06-03 ENCOUNTER — Ambulatory Visit: Payer: Medicare Other | Admitting: Family Medicine

## 2017-06-05 ENCOUNTER — Ambulatory Visit (INDEPENDENT_AMBULATORY_CARE_PROVIDER_SITE_OTHER): Payer: Medicare Other | Admitting: Family Medicine

## 2017-06-05 ENCOUNTER — Encounter: Payer: Self-pay | Admitting: Family Medicine

## 2017-06-05 VITALS — BP 136/82 | HR 72 | Wt 148.8 lb

## 2017-06-05 DIAGNOSIS — E118 Type 2 diabetes mellitus with unspecified complications: Secondary | ICD-10-CM

## 2017-06-05 DIAGNOSIS — I10 Essential (primary) hypertension: Secondary | ICD-10-CM | POA: Diagnosis not present

## 2017-06-05 DIAGNOSIS — E1159 Type 2 diabetes mellitus with other circulatory complications: Secondary | ICD-10-CM | POA: Diagnosis not present

## 2017-06-05 DIAGNOSIS — E785 Hyperlipidemia, unspecified: Secondary | ICD-10-CM | POA: Diagnosis not present

## 2017-06-05 DIAGNOSIS — L03115 Cellulitis of right lower limb: Secondary | ICD-10-CM

## 2017-06-05 DIAGNOSIS — H9113 Presbycusis, bilateral: Secondary | ICD-10-CM | POA: Diagnosis not present

## 2017-06-05 DIAGNOSIS — E1169 Type 2 diabetes mellitus with other specified complication: Secondary | ICD-10-CM | POA: Diagnosis not present

## 2017-06-05 DIAGNOSIS — I152 Hypertension secondary to endocrine disorders: Secondary | ICD-10-CM

## 2017-06-05 LAB — POCT GLYCOSYLATED HEMOGLOBIN (HGB A1C): Hemoglobin A1C: 8

## 2017-06-05 MED ORDER — SAXAGLIPTIN HCL 5 MG PO TABS: 5.0000 mg | ORAL_TABLET | Freq: Every day | ORAL | 5 refills | Status: DC

## 2017-06-05 MED ORDER — DOXYCYCLINE HYCLATE 100 MG PO TABS: 100.0000 mg | ORAL_TABLET | Freq: Two times a day (BID) | ORAL | 0 refills | Status: DC

## 2017-06-05 NOTE — Progress Notes (Signed)
Subjective:    Patient ID: Kristin Cook, female    DOB: 1932/01/26, 84 y.o.   MRN: [id number]  Kristin Cook is a 84 y.o. female who presents for follow-up of Type 2 diabetes mellitus.  Patient is checking home blood sugars.   Home blood sugar records: yes How often is blood sugars being checked: twice a day Current symptoms/problems include none and have been unchanged. Daily foot checks: yes   Any foot concerns: no just spot on leg Last eye exam: this year Exercise: walking  She continues on Actos plus as well as Onglyza.  She also is taking amlodipine plus lisinopril/HCTZ and Crestor.  She is having no difficulty with any of these.  She does have presbycusis but is not wearing her hearing aids even though they do help her.  She also complains of a lesion present on her right lower leg area.  It is red and tender and getting worse.  She also has had some difficulties with her bowel habits however could not get a good feel for her as to whether The following portions of the patient's history were reviewed and updated as appropriate: allergies, current medications, past medical history, past social history and problem list.  ROS as in subjective above.     Objective:    Physical Exam Alert and in no distress multiple healing lesions are noted on the calf and also erythematous tender and warm area approximately 4 cm in size is noted on the posterior distal calf area.   Lab Review Diabetic Labs Latest Ref Rng & Units 02/03/2017 10/14/2016 09/29/2016 07/01/2016 02/26/2016  HbA1c - 7.3 - 7.1 7.6 7.5  Microalbumin mg/L 73.6 - - - -  Micro/Creat Ratio - 123.3 - - - -  Chol <200 mg/dL - - 185 - -  HDL >63 mg/dL - - 93 - -  Calc LDL <149 mg/dL - - 43 - -  Triglycerides <150 mg/dL - - 51 - -  Creatinine 0.60 - 0.88 mg/dL - 7.02 6.37 - -   BP/Weight 02/03/2017 09/29/2016 07/01/2016 02/26/2016 10/22/2015  Systolic BP 142 132 130 114 126  Diastolic BP 80 80 80 80 70  Wt. (Lbs) 155.6 155  156 163.4 166  BMI 27.56 27.46 27.63 28.95 29.41   Foot/eye exam completion dates Latest Ref Rng & Units 09/29/2016 08/25/2016  Eye Exam No Retinopathy - Retinopathy(A)  Foot Form Completion - Done -  A1c is 8.0  Kristin Cook  reports that she has quit smoking. She has never used smokeless tobacco. She reports that she does not drink alcohol or use drugs.     Assessment & Plan:    Controlled diabetes mellitus type 2 with complications, unspecified whether long term insulin use (HCC) - Plan: POCT glycosylated hemoglobin (Hb A1C), saxagliptin HCl (ONGLYZA) 5 MG TABS tablet  Cellulitis of right lower extremity - Plan: doxycycline (VIBRA-TABS) 100 MG tablet  Hypertension associated with diabetes (HCC)  Hyperlipidemia associated with type 2 diabetes mellitus (HCC)  Presbycusis of both ears   1. Rx changes: Increase Onglyza we will also add doxycycline to her regimen to help with the infection. 2. Education: Reviewed 'ABCs' of diabetes management (respective goals in parentheses):  A1C (<7), blood pressure (<130/80), and cholesterol (LDL <100). 3. Compliance at present is estimated to be poor. Efforts to improve compliance (if necessary) will be directed at increased exercise.  Also discussed watching her food intake. 4. Follow up: 2 weeks Strongly encouraged her to use her hearing aids.

## 2017-06-05 NOTE — Patient Instructions (Signed)
Take all the antibiotic but I also want you to use Metamucil to help with your diarrhea Wear your hearing aid

## 2017-06-08 ENCOUNTER — Other Ambulatory Visit: Payer: Self-pay | Admitting: Family Medicine

## 2017-06-11 ENCOUNTER — Encounter: Payer: Self-pay | Admitting: Family Medicine

## 2017-06-11 ENCOUNTER — Ambulatory Visit (INDEPENDENT_AMBULATORY_CARE_PROVIDER_SITE_OTHER): Payer: Medicare Other | Admitting: Family Medicine

## 2017-06-11 VITALS — BP 150/84 | HR 81 | Temp 98.0°F | Wt 148.6 lb

## 2017-06-11 DIAGNOSIS — L03115 Cellulitis of right lower limb: Secondary | ICD-10-CM

## 2017-06-11 NOTE — Progress Notes (Signed)
   Subjective:    Patient ID: Kristin Cook, female    DOB: October 12, 1931, 84 y.o.   MRN: [id number]  HPI She is here for recheck on cellulitis of the right lower extremity.  Presently she is having no concerns or complaints about it.   Review of Systems     Objective:   Physical Exam Alert and in no distress.  Exam of the right lower extremity does show slight worsening of the erythema but it is not hot or tender.       Assessment & Plan:  Cellulitis of right lower extremity I am planning on rechecking this again in another 2 weeks.  I think this is going to be a very slow process because of the area but I do not see any evidence that makes me think the infection is getting worse.

## 2017-06-24 ENCOUNTER — Ambulatory Visit (INDEPENDENT_AMBULATORY_CARE_PROVIDER_SITE_OTHER): Payer: Medicare Other | Admitting: Family Medicine

## 2017-06-24 ENCOUNTER — Encounter: Payer: Self-pay | Admitting: Family Medicine

## 2017-06-24 VITALS — BP 130/88 | HR 116 | Temp 98.0°F | Ht 63.0 in | Wt 153.2 lb

## 2017-06-24 DIAGNOSIS — L03115 Cellulitis of right lower limb: Secondary | ICD-10-CM | POA: Diagnosis not present

## 2017-06-24 NOTE — Progress Notes (Signed)
   Subjective:    Patient ID: Kristin Cook, female    DOB: 1931/11/11, 84 y.o.   MRN: [id number]  HPI She is here for recheck.  She is doing much better with her leg and is not causing any difficulty.   Review of Systems     Objective:   Physical Exam Alert and in no distress.  Exam of the right posterior calf area does show erythema with some decrease in her swelling.       Assessment & Plan:  Cellulitis of right lower extremity  the lesion appears to be healing fairly well.  Plan to see her again in roughly 3 months for routine checkup.

## 2017-07-06 DIAGNOSIS — E113492 Type 2 diabetes mellitus with severe nonproliferative diabetic retinopathy without macular edema, left eye: Secondary | ICD-10-CM | POA: Diagnosis not present

## 2017-07-06 DIAGNOSIS — H353221 Exudative age-related macular degeneration, left eye, with active choroidal neovascularization: Secondary | ICD-10-CM | POA: Diagnosis not present

## 2017-07-06 DIAGNOSIS — H3562 Retinal hemorrhage, left eye: Secondary | ICD-10-CM | POA: Diagnosis not present

## 2017-07-06 DIAGNOSIS — H43812 Vitreous degeneration, left eye: Secondary | ICD-10-CM | POA: Diagnosis not present

## 2017-09-01 ENCOUNTER — Encounter: Payer: Self-pay | Admitting: Family Medicine

## 2017-09-01 ENCOUNTER — Ambulatory Visit (INDEPENDENT_AMBULATORY_CARE_PROVIDER_SITE_OTHER): Payer: Medicare Other | Admitting: Family Medicine

## 2017-09-01 VITALS — BP 158/92 | HR 110 | Temp 98.0°F | Wt 150.2 lb

## 2017-09-01 DIAGNOSIS — R159 Full incontinence of feces: Secondary | ICD-10-CM | POA: Diagnosis not present

## 2017-09-01 DIAGNOSIS — L03115 Cellulitis of right lower limb: Secondary | ICD-10-CM

## 2017-09-01 DIAGNOSIS — I10 Essential (primary) hypertension: Secondary | ICD-10-CM | POA: Diagnosis not present

## 2017-09-01 DIAGNOSIS — E1159 Type 2 diabetes mellitus with other circulatory complications: Secondary | ICD-10-CM | POA: Diagnosis not present

## 2017-09-01 DIAGNOSIS — H9113 Presbycusis, bilateral: Secondary | ICD-10-CM

## 2017-09-01 DIAGNOSIS — E1169 Type 2 diabetes mellitus with other specified complication: Secondary | ICD-10-CM

## 2017-09-01 DIAGNOSIS — E785 Hyperlipidemia, unspecified: Secondary | ICD-10-CM

## 2017-09-01 DIAGNOSIS — I152 Hypertension secondary to endocrine disorders: Secondary | ICD-10-CM

## 2017-09-01 LAB — LIPID PANEL
Chol/HDL Ratio: 3 ratio (ref 0.0–4.4)
Cholesterol, Total: 241 mg/dL — ABNORMAL HIGH (ref 100–199)
HDL: 81 mg/dL (ref >39)
LDL Calculated: 146 mg/dL — ABNORMAL HIGH (ref 0–99)
Triglycerides: 72 mg/dL (ref 0–149)
VLDL Cholesterol Cal: 14 mg/dL (ref 5–40)

## 2017-09-01 LAB — COMPREHENSIVE METABOLIC PANEL
ALT: 10 IU/L (ref 0–32)
AST: 15 IU/L (ref 0–40)
Albumin/Globulin Ratio: 2 (ref 1.2–2.2)
Albumin: 4.7 g/dL (ref 3.5–4.7)
Alkaline Phosphatase: 84 IU/L (ref 39–117)
BUN/Creatinine Ratio: 14 (ref 12–28)
BUN: 9 mg/dL (ref 8–27)
Bilirubin Total: 0.3 mg/dL (ref 0.0–1.2)
CO2: 25 mmol/L (ref 20–29)
Calcium: 9.5 mg/dL (ref 8.7–10.3)
Chloride: 98 mmol/L (ref 96–106)
Creatinine, Ser: 0.66 mg/dL (ref 0.57–1.00)
GFR calc Af Amer: 94 mL/min/{1.73_m2} (ref >59)
GFR calc non Af Amer: 81 mL/min/{1.73_m2} (ref >59)
Globulin, Total: 2.3 g/dL (ref 1.5–4.5)
Glucose: 201 mg/dL — ABNORMAL HIGH (ref 65–99)
Potassium: 3.9 mmol/L (ref 3.5–5.2)
Sodium: 139 mmol/L (ref 134–144)
Total Protein: 7 g/dL (ref 6.0–8.5)

## 2017-09-01 LAB — CBC WITH DIFFERENTIAL/PLATELET
Basophils Absolute: 0 10*3/uL (ref 0.0–0.2)
Basos: 0 %
EOS (ABSOLUTE): 0.2 10*3/uL (ref 0.0–0.4)
Eos: 2 %
Hematocrit: 39.5 % (ref 34.0–46.6)
Hemoglobin: 13.7 g/dL (ref 11.1–15.9)
Immature Grans (Abs): 0 10*3/uL (ref 0.0–0.1)
Immature Granulocytes: 0 %
Lymphocytes Absolute: 1.3 10*3/uL (ref 0.7–3.1)
Lymphs: 13 %
MCH: 30.7 pg (ref 26.6–33.0)
MCHC: 34.7 g/dL (ref 31.5–35.7)
MCV: 89 fL (ref 79–97)
Monocytes Absolute: 1 10*3/uL — ABNORMAL HIGH (ref 0.1–0.9)
Monocytes: 9 %
Neutrophils Absolute: 7.9 10*3/uL — ABNORMAL HIGH (ref 1.4–7.0)
Neutrophils: 76 %
Platelets: 254 10*3/uL (ref 150–450)
RBC: 4.46 x10E6/uL (ref 3.77–5.28)
RDW: 14.3 % (ref 12.3–15.4)
WBC: 10.4 10*3/uL (ref 3.4–10.8)

## 2017-09-01 LAB — HEMOCCULT GUIAC POC 1CARD (OFFICE)

## 2017-09-01 NOTE — Progress Notes (Signed)
   Subjective:    Patient ID: Kristin Cook, female    DOB: [birth year], 84 y.o.   MRN: [id number]  HPI She complains of a 27-month history of intermittent black stools.  During the same time she has had difficulty with fecal incontinence stating that every time she eats she can have a loose stool and is using Pepto-Bismol to help with this.  She has had no abdominal pain, vomiting, weight loss.  She continues have difficulty with nonhealing lesions on her right lower extremity. She does have underlying diabetes, hypertension and hyperlipidemia and is on medications listed in the chart.  She is hard of hearing but has not used her hearing aids. Review of Systems     Objective:   Physical Exam Alert and in no distress.  Cardiac exam shows regular rhythm without murmurs or gallops.  Lungs are clear to auscultation.  Abdominal exam shows active bowel sounds without masses or tenderness.  Rectal exam shows no stool in the vault and is guaiac negative. Right lower extremity does show to lesions that are slowly healing, one is approximately 2 cm in diameter the other one is 3.  There is some surrounding slight erythema.      Assessment & Plan:  Incontinence of feces, unspecified fecal incontinence type - Plan: CBC with Differential/Platelet, Comprehensive metabolic panel, POCT occult blood stool, Ambulatory referral to Gastroenterology  Cellulitis of right lower extremity - Plan: CBC with Differential/Platelet, Comprehensive metabolic panel  Hyperlipidemia associated with type 2 diabetes mellitus (HCC) - Plan: Lipid panel  Hypertension associated with diabetes (HCC) - Plan: CBC with Differential/Platelet, Comprehensive metabolic panel  Presbycusis of both ears I will reevaluate the legs when she comes in for her next diabetes visit.  Strongly encouraged her to use her hearing aids.  Referred to GI for further evaluation and treatment of her incontinence.

## 2017-09-02 MED ORDER — ROSUVASTATIN CALCIUM 20 MG PO TABS: 20.0000 mg | ORAL_TABLET | Freq: Every day | ORAL | 3 refills | Status: DC

## 2017-09-02 NOTE — Addendum Note (Signed)
Addended by: Ronnald Nian on: 09/02/2017 08:16 AM   Modules accepted: Orders

## 2017-09-03 DIAGNOSIS — R194 Change in bowel habit: Secondary | ICD-10-CM | POA: Diagnosis not present

## 2017-09-03 DIAGNOSIS — R197 Diarrhea, unspecified: Secondary | ICD-10-CM | POA: Diagnosis not present

## 2017-09-16 ENCOUNTER — Other Ambulatory Visit: Payer: Self-pay | Admitting: Family Medicine

## 2017-09-16 DIAGNOSIS — E118 Type 2 diabetes mellitus with unspecified complications: Secondary | ICD-10-CM

## 2017-09-18 ENCOUNTER — Telehealth: Payer: Self-pay

## 2017-09-18 NOTE — Telephone Encounter (Signed)
PA was started for pt onglyza but found out it was not needed for 30 day supply of med. Pt and pharmacy was advised of this as well

## 2017-10-01 ENCOUNTER — Encounter: Payer: Self-pay | Admitting: Family Medicine

## 2017-10-01 ENCOUNTER — Ambulatory Visit (INDEPENDENT_AMBULATORY_CARE_PROVIDER_SITE_OTHER): Payer: Medicare Other | Admitting: Family Medicine

## 2017-10-01 VITALS — BP 120/68 | HR 76 | Temp 97.9°F | Wt 145.2 lb

## 2017-10-01 DIAGNOSIS — I10 Essential (primary) hypertension: Secondary | ICD-10-CM

## 2017-10-01 DIAGNOSIS — E1169 Type 2 diabetes mellitus with other specified complication: Secondary | ICD-10-CM | POA: Diagnosis not present

## 2017-10-01 DIAGNOSIS — H9113 Presbycusis, bilateral: Secondary | ICD-10-CM | POA: Diagnosis not present

## 2017-10-01 DIAGNOSIS — E1159 Type 2 diabetes mellitus with other circulatory complications: Secondary | ICD-10-CM | POA: Diagnosis not present

## 2017-10-01 DIAGNOSIS — E785 Hyperlipidemia, unspecified: Secondary | ICD-10-CM

## 2017-10-01 DIAGNOSIS — I152 Hypertension secondary to endocrine disorders: Secondary | ICD-10-CM

## 2017-10-01 DIAGNOSIS — E118 Type 2 diabetes mellitus with unspecified complications: Secondary | ICD-10-CM | POA: Diagnosis not present

## 2017-10-01 LAB — POCT GLYCOSYLATED HEMOGLOBIN (HGB A1C): Hemoglobin A1C: 7.2 % — AB (ref 4.0–5.6)

## 2017-10-01 NOTE — Progress Notes (Signed)
  Subjective:    Patient ID: Kristin Cook, female    DOB: 04-01-1931, 84 y.o.   MRN: [id number]  Kristin Cook is a 84 y.o. female who presents for follow-up of Type 2 diabetes mellitus.  Patient is checking home blood sugars.   Home blood sugar records: manuel record How often is blood sugars being checked: Bid Current symptoms/problems include none and have been unchanged. Daily foot checks:yes  Any foot concerns: yes swelling and sores on leg Last eye exam: 2019 dr. Luciana Axe Exercise: not much trouble with feet She continues on Crestor for her lipids.  She is also taking Actos plus and now is also on since her last visit.  She seemed to be tolerating this well.  Continues on amlodipine and lisinopril/HCTZ.  She does have hearing aids but does not use them. The following portions of the patient's history were reviewed and updated as appropriate: allergies, current medications, past medical history, past social history and problem list.  ROS as in subjective above.     Objective:    Physical Exam Alert and in no distress otherwise not examined.  You are otherwise charts exam charts done   Lab Review Diabetic Labs Latest Ref Rng & Units 09/01/2017 06/05/2017 02/03/2017 10/14/2016 09/29/2016  HbA1c - - 8.0 7.3 - 7.1  Microalbumin mg/L - - 73.6 - -  Micro/Creat Ratio - - - 123.3 - -  Chol 100 - 199 mg/dL 373(S) - - - 287  HDL >68 mg/dL 81 - - - 93  Calc LDL 0 - 99 mg/dL 115(B) - - - 43  Triglycerides 0 - 149 mg/dL 72 - - - 51  Creatinine 0.57 - 1.00 mg/dL 2.62 - - 0.35 5.97   BP/Weight 09/01/2017 06/24/2017 06/11/2017 06/05/2017 02/03/2017  Systolic BP 158 130 150 136 142  Diastolic BP 92 88 84 82 80  Wt. (Lbs) 150.2 153.2 148.6 148.8 155.6  BMI 26.61 27.14 26.32 26.36 27.56   Foot/eye exam completion dates Latest Ref Rng & Units 09/29/2016 08/25/2016  Eye Exam No Retinopathy - Retinopathy(A)  Foot Form Completion - Done -  A1c is 7.2  [place]  reports that she has quit smoking.  She has never used smokeless tobacco. She reports that she does not drink alcohol or use drugs.     Assessment & Plan:    Controlled type 2 diabetes mellitus with complication, without long-term current use of insulin (HCC) - Plan: POCT glycosylated hemoglobin (Hb A1C)  Presbycusis of both ears  Hypertension associated with diabetes (HCC)  Hyperlipidemia associated with type 2 diabetes mellitus (HCC)    1. Rx changes: none 2. Education: Reviewed 'ABCs' of diabetes management (respective goals in parentheses):  A1C (<7), blood pressure (<130/80), and cholesterol (LDL <100). 3. Compliance at present is estimated to be good. Efforts to improve compliance (if necessary) will be directed at increased exercise. 4. Follow up: 4 months Encouraged her to use her hearing aids.

## 2017-10-01 NOTE — Patient Instructions (Signed)
20 minutes of something physical every day 

## 2017-10-03 ENCOUNTER — Telehealth: Payer: Self-pay | Admitting: Family Medicine

## 2017-10-03 NOTE — Telephone Encounter (Signed)
P.A. Napoleon Form

## 2017-10-04 ENCOUNTER — Ambulatory Visit (HOSPITAL_COMMUNITY)
Admission: EM | Admit: 2017-10-04 | Discharge: 2017-10-04 | Disposition: A | Discharge status: Home / Self Care | Payer: Medicare Other | Attending: Internal Medicine | Admitting: Internal Medicine

## 2017-10-04 ENCOUNTER — Encounter (HOSPITAL_COMMUNITY): Payer: Self-pay

## 2017-10-04 DIAGNOSIS — I83023 Varicose veins of left lower extremity with ulcer of ankle: Secondary | ICD-10-CM

## 2017-10-04 DIAGNOSIS — L97328 Non-pressure chronic ulcer of left ankle with other specified severity: Secondary | ICD-10-CM | POA: Diagnosis not present

## 2017-10-04 MED ORDER — DOXYCYCLINE HYCLATE 100 MG PO CAPS: 100.0000 mg | ORAL_CAPSULE | Freq: Two times a day (BID) | ORAL | 0 refills | Status: DC

## 2017-10-04 MED ORDER — MUPIROCIN 2 % EX OINT: 1.0000 "application " | TOPICAL_OINTMENT | Freq: Two times a day (BID) | CUTANEOUS | 0 refills | Status: DC

## 2017-10-04 NOTE — ED Provider Notes (Addendum)
[place]    CSN: [id number] Arrival date & time: 10/04/17  1142     History   Chief Complaint Chief Complaint  Patient presents with  . Sore    appointment 1207    HPI EMELYN ROEN is a 84 y.o. female history of DM type II, macular degeneration, arthritis, dyslipidemia, hypertension, presenting today for evaluation of persistent sore/ulcer.  Patient states that ulcers have been present for the past 2 to 3 months.  She feels as if they are worsening.  She has been on one course of antibiotics from her PCP.  She denies any history with issues with circulation.  She does note that she has diabetic neuropathy she has not been applying anything to these ulcers.  Ulcers are causing pain.  HPI  Past Medical History:  Diagnosis Date  . Arthritis   . Cataract   . Cystocele   . Dementia    forgetfulness  . Diabetes mellitus   . Dyslipidemia   . History of colonic polyps   . Hx MRSA infection   . Hyperlipidemia   . Hypertension   . Mental disorder   . Neuropathic pain of lower extremity   . OAB (overactive bladder)   . Shortness of breath    with exertion    Patient Active Problem List   Diagnosis Date Noted  . Macular degeneration of left eye 07/01/2016  . Onychomycosis 10/22/2015  . Presbycusis of both ears 06/19/2015  . Severe nonproliferative diabetic retinopathy of left eye with macular edema ([place]) 12/30/2011  . Arthritis 09/29/2011  . Controlled diabetes mellitus type 2 with complications ([place]) 20/35/5974  . Hypertension associated with diabetes ([place]) 11/05/2010  . Hyperlipidemia associated with type 2 diabetes mellitus ([place]) 11/05/2010    Past Surgical History:  Procedure Laterality Date  . ABDOMINAL HYSTERECTOMY    . EYE SURGERY Bilateral    Cataract  . PARS PLANA VITRECTOMY Left 03/15/2013   Procedure: PARS PLANA VITRECTOMY WITH 25 GAUGE LEFT EYE with Endolaser;  Surgeon: Hurman Horn, MD;  [facility];  Service: Ophthalmology;   Laterality: Left;  Marland Kitchen VAGINAL PROLAPSE REPAIR  2007   Dr. Gaetano Net    OB History   None      Home Medications    Prior to Admission medications   Medication Sig Start Date End Date Taking? Authorizing Provider  amLODipine (NORVASC) 5 MG tablet Take 1 tablet (5 mg total) by mouth daily. 09/29/16   [name], MD  bismuth subsalicylate (PEPTO BISMOL) 262 MG/15ML suspension Take 30 mLs by mouth every 6 (six) hours as needed.    [provider]  diphenhydramine-acetaminophen (TYLENOL PM) 25-500 MG TABS Take 1 tablet by mouth at bedtime as needed.    [provider]  doxycycline (VIBRAMYCIN) 100 MG capsule Take 1 capsule (100 mg total) by mouth 2 (two) times daily. 10/04/17   Emilya Justen C, PA-C  lisinopril-hydrochlorothiazide (PRINZIDE,ZESTORETIC) 20-12.5 MG tablet Take 1 tablet by mouth daily. 09/29/16   [name], MD  loperamide (IMODIUM) 2 MG capsule Take 2 mg by mouth as needed for diarrhea or loose stools.    [provider]  mupirocin ointment (BACTROBAN) 2 % Apply 1 application topically 2 (two) times daily. 10/04/17   Tryniti Laatsch C, PA-C  ONE TOUCH ULTRA TEST test strip USE TO TEST BLOOD SUGAR TWICE DAILY 06/08/17   [name], MD  pioglitazone-metformin (ACTOPLUS MET) [phone] MG tablet TAKE 1 TABLET BY MOUTH TWICE DAILY WITH  A MEAL 09/16/17   [name], MD  rosuvastatin (CRESTOR) 20 MG tablet Take 1 tablet (20 mg total) by mouth daily. 09/02/17   [name], MD  saxagliptin HCl (ONGLYZA) 5 MG TABS tablet Take 1 tablet (5 mg total) by mouth daily. 06/05/17   [name], MD    Family History Family History  Problem Relation Age of Onset  . Healthy Mother   . Diabetes Father   . Hypertension Neg Hx     Social History Social History   Tobacco Use  . Smoking status: Former Research scientist (life sciences)  . Smokeless tobacco: Never Used  . Tobacco comment: Several years ago  Substance Use Topics  . Alcohol use: No  . Drug use: No      Allergies   Penicillins and Sulfa antibiotics   Review of Systems Review of Systems  Constitutional: Negative for fatigue and fever.  HENT: Negative for mouth sores.   Eyes: Negative for visual disturbance.  Respiratory: Negative for shortness of breath.   Cardiovascular: Negative for chest pain.  Gastrointestinal: Negative for abdominal pain, nausea and vomiting.  Genitourinary: Negative for genital sores.  Musculoskeletal: Negative for arthralgias and joint swelling.  Skin: Positive for color change and wound. Negative for rash.  Neurological: Negative for dizziness, weakness, light-headedness and headaches.     Physical Exam Triage Vital Signs ED Triage Vitals  Enc Vitals Group     BP 10/04/17 1211 (!) 162/89     Pulse Rate 10/04/17 1211 79     Resp 10/04/17 1211 18     Temp 10/04/17 1211 98 F (36.7 C)     Temp src --      SpO2 10/04/17 1211 100 %     Weight --      Height --      Head Circumference --      Peak Flow --      Pain Score 10/04/17 1210 4     Pain Loc --      Pain Edu? --      Excl. in [place]? --    No data found.  Updated Vital Signs BP (!) 162/89   Pulse 79   Temp 98 F (36.7 C)   Resp 18   SpO2 100%   Visual Acuity Right Eye Distance:   Left Eye Distance:   Bilateral Distance:    Right Eye Near:   Left Eye Near:    Bilateral Near:     Physical Exam  Constitutional: She is oriented to person, place, and time. She appears well-developed and well-nourished.  No acute distress  HENT:  Head: Normocephalic and atraumatic.  Nose: Nose normal.  Eyes: Conjunctivae are normal.  Neck: Neck supple.  Cardiovascular: Normal rate.  Pulmonary/Chest: Effort normal. No respiratory distress.  Abdominal: She exhibits no distension.  Musculoskeletal: Normal range of motion.  Neurological: She is alert and oriented to person, place, and time.  Skin: Skin is warm and dry.  Picture 1: Necrotic ulcer to anterior aspect of middle third of right  shin, measuring 1.5 cm wide by 2 cm height, mild surrounding erythema, also does not appear to extend towards bone  Picture 2: Similar necrotic wound to posterior distal third of lower leg measuring 2 cm wide by 1 cm height, mild surrounding erythema  Psychiatric: She has a normal mood and affect.  Nursing note and vitals reviewed.         UC Treatments / Results  Labs (all labs ordered are  listed, but only abnormal results are displayed) Labs Reviewed - No data to display  EKG None  Radiology No results found.  Procedures Procedures (including critical care time)  Medications Ordered in UC Medications - No data to display  Initial Impression / Assessment and Plan / UC Course  I have reviewed the triage vital signs and the nursing notes.  Pertinent labs & imaging results that were available during my care of the patient were reviewed by me and considered in my medical decision making (see chart for details).     Patient with what appears to be venous stasis ulcers, will provide patient with doxycycline and Bactroban, will have patient follow-up with wound care, advised her to call them tomorrow.  Vital signs stable. discussed strict return precautions. Patient verbalized understanding and is agreeable with plan.  Final Clinical Impressions(s) / UC Diagnoses   Final diagnoses:  Venous stasis ulcer of left ankle with other ulcer severity, unspecified whether varicose veins present [facility])     Discharge Instructions     Please begin taking doxycycline twice daily for the next 10 days Please apply Bactroban to wound twice daily Please call wound care tomorrow and follow-up with him  Please return here go to emergency room if developing worsening pain, redness, swelling, drainage   ED Prescriptions    Medication Sig Dispense Auth. Provider   doxycycline (VIBRAMYCIN) 100 MG capsule Take 1 capsule (100 mg total) by mouth 2 (two) times daily. 20 capsule Aveline Daus  C, PA-C   mupirocin ointment (BACTROBAN) 2 % Apply 1 application topically 2 (two) times daily. 22 g Maxen Rowland, Stephan C, PA-C     Controlled Substance Prescriptions [place] Controlled Substance Registry consulted? No   Janith Lima, PA-C 10/04/17 1938    Janith Lima, [place] 10/04/17 1939

## 2017-10-04 NOTE — Discharge Instructions (Signed)
Please begin taking doxycycline twice daily for the next 10 days Please apply Bactroban to wound twice daily Please call wound care tomorrow and follow-up with him  Please return here go to emergency room if developing worsening pain, redness, swelling, drainage

## 2017-10-04 NOTE — ED Triage Notes (Signed)
Pt presents for sore that is worsening over last three months.

## 2017-10-12 NOTE — Telephone Encounter (Signed)
Per [facility] no P.A. required

## 2017-10-16 ENCOUNTER — Telehealth: Payer: Self-pay | Admitting: Family Medicine

## 2017-10-16 NOTE — Telephone Encounter (Signed)
Called pharmacy and pt paid $45 for 30 days last month and paid $437 for 90 days this month.  Called help desk t# [phone] and [facility] Form is covered but is a Tier 3 options are mail order will be cheaper with [facility] wil cost around $190 a month.  Also can complete a Tiering exception, which I completed over the phone.   Called pt and she wants to switch to mail order for next refill.

## 2017-10-16 NOTE — Telephone Encounter (Signed)
Called pharmacy and pt paid $437 for 90 days this month.  Called help desk t# [phone] and [facility] Form is covered but is a Tier 3 options are mail order will be cheaper with [facility] wil cost around $190 a month.    Called pt and she wants to switch to mail order for next refill.  Please send in 90 days to [facility] for Onglyza 5mg  tabs

## 2017-10-19 ENCOUNTER — Other Ambulatory Visit: Payer: Self-pay

## 2017-10-19 DIAGNOSIS — E118 Type 2 diabetes mellitus with unspecified complications: Secondary | ICD-10-CM

## 2017-10-19 MED ORDER — SAXAGLIPTIN HCL 5 MG PO TABS: 5.0000 mg | ORAL_TABLET | Freq: Every day | ORAL | 5 refills | Status: DC

## 2017-10-19 NOTE — Telephone Encounter (Signed)
Done KH 

## 2017-10-20 ENCOUNTER — Encounter (HOSPITAL_BASED_OUTPATIENT_CLINIC_OR_DEPARTMENT_OTHER): Payer: Medicare Other | Attending: Internal Medicine

## 2017-10-20 DIAGNOSIS — I10 Essential (primary) hypertension: Secondary | ICD-10-CM | POA: Insufficient documentation

## 2017-10-20 DIAGNOSIS — F0390 Unspecified dementia without behavioral disturbance: Secondary | ICD-10-CM | POA: Diagnosis not present

## 2017-10-20 DIAGNOSIS — L97812 Non-pressure chronic ulcer of other part of right lower leg with fat layer exposed: Secondary | ICD-10-CM | POA: Diagnosis not present

## 2017-10-20 DIAGNOSIS — Z7984 Long term (current) use of oral hypoglycemic drugs: Secondary | ICD-10-CM | POA: Insufficient documentation

## 2017-10-20 DIAGNOSIS — L97212 Non-pressure chronic ulcer of right calf with fat layer exposed: Secondary | ICD-10-CM | POA: Diagnosis not present

## 2017-10-20 DIAGNOSIS — E11622 Type 2 diabetes mellitus with other skin ulcer: Secondary | ICD-10-CM | POA: Diagnosis not present

## 2017-10-20 DIAGNOSIS — E1140 Type 2 diabetes mellitus with diabetic neuropathy, unspecified: Secondary | ICD-10-CM | POA: Diagnosis not present

## 2017-10-20 DIAGNOSIS — I872 Venous insufficiency (chronic) (peripheral): Secondary | ICD-10-CM | POA: Insufficient documentation

## 2017-10-20 DIAGNOSIS — E113412 Type 2 diabetes mellitus with severe nonproliferative diabetic retinopathy with macular edema, left eye: Secondary | ICD-10-CM | POA: Insufficient documentation

## 2017-10-20 DIAGNOSIS — L97818 Non-pressure chronic ulcer of other part of right lower leg with other specified severity: Secondary | ICD-10-CM | POA: Diagnosis not present

## 2017-10-20 DIAGNOSIS — Z87891 Personal history of nicotine dependence: Secondary | ICD-10-CM | POA: Insufficient documentation

## 2017-10-27 DIAGNOSIS — E1140 Type 2 diabetes mellitus with diabetic neuropathy, unspecified: Secondary | ICD-10-CM | POA: Diagnosis not present

## 2017-10-27 DIAGNOSIS — Z7984 Long term (current) use of oral hypoglycemic drugs: Secondary | ICD-10-CM | POA: Diagnosis not present

## 2017-10-27 DIAGNOSIS — I10 Essential (primary) hypertension: Secondary | ICD-10-CM | POA: Diagnosis not present

## 2017-10-27 DIAGNOSIS — I872 Venous insufficiency (chronic) (peripheral): Secondary | ICD-10-CM | POA: Diagnosis not present

## 2017-10-27 DIAGNOSIS — E113412 Type 2 diabetes mellitus with severe nonproliferative diabetic retinopathy with macular edema, left eye: Secondary | ICD-10-CM | POA: Diagnosis not present

## 2017-10-27 DIAGNOSIS — L97212 Non-pressure chronic ulcer of right calf with fat layer exposed: Secondary | ICD-10-CM | POA: Diagnosis not present

## 2017-10-28 DIAGNOSIS — Z87891 Personal history of nicotine dependence: Secondary | ICD-10-CM | POA: Diagnosis not present

## 2017-10-28 DIAGNOSIS — L97212 Non-pressure chronic ulcer of right calf with fat layer exposed: Secondary | ICD-10-CM | POA: Diagnosis not present

## 2017-10-28 DIAGNOSIS — E1140 Type 2 diabetes mellitus with diabetic neuropathy, unspecified: Secondary | ICD-10-CM | POA: Diagnosis not present

## 2017-10-28 DIAGNOSIS — I872 Venous insufficiency (chronic) (peripheral): Secondary | ICD-10-CM | POA: Diagnosis not present

## 2017-10-28 DIAGNOSIS — E113412 Type 2 diabetes mellitus with severe nonproliferative diabetic retinopathy with macular edema, left eye: Secondary | ICD-10-CM | POA: Diagnosis not present

## 2017-10-28 DIAGNOSIS — L97818 Non-pressure chronic ulcer of other part of right lower leg with other specified severity: Secondary | ICD-10-CM | POA: Diagnosis not present

## 2017-10-28 DIAGNOSIS — E11622 Type 2 diabetes mellitus with other skin ulcer: Secondary | ICD-10-CM | POA: Diagnosis not present

## 2017-10-28 DIAGNOSIS — Z7984 Long term (current) use of oral hypoglycemic drugs: Secondary | ICD-10-CM | POA: Diagnosis not present

## 2017-10-28 DIAGNOSIS — L97812 Non-pressure chronic ulcer of other part of right lower leg with fat layer exposed: Secondary | ICD-10-CM | POA: Diagnosis not present

## 2017-10-28 DIAGNOSIS — I10 Essential (primary) hypertension: Secondary | ICD-10-CM | POA: Diagnosis not present

## 2017-10-30 DIAGNOSIS — Z7984 Long term (current) use of oral hypoglycemic drugs: Secondary | ICD-10-CM | POA: Diagnosis not present

## 2017-10-30 DIAGNOSIS — I872 Venous insufficiency (chronic) (peripheral): Secondary | ICD-10-CM | POA: Diagnosis not present

## 2017-10-30 DIAGNOSIS — E113412 Type 2 diabetes mellitus with severe nonproliferative diabetic retinopathy with macular edema, left eye: Secondary | ICD-10-CM | POA: Diagnosis not present

## 2017-10-30 DIAGNOSIS — I10 Essential (primary) hypertension: Secondary | ICD-10-CM | POA: Diagnosis not present

## 2017-10-30 DIAGNOSIS — L97212 Non-pressure chronic ulcer of right calf with fat layer exposed: Secondary | ICD-10-CM | POA: Diagnosis not present

## 2017-10-30 DIAGNOSIS — E1140 Type 2 diabetes mellitus with diabetic neuropathy, unspecified: Secondary | ICD-10-CM | POA: Diagnosis not present

## 2017-11-04 DIAGNOSIS — E11622 Type 2 diabetes mellitus with other skin ulcer: Secondary | ICD-10-CM | POA: Diagnosis not present

## 2017-11-04 DIAGNOSIS — E113412 Type 2 diabetes mellitus with severe nonproliferative diabetic retinopathy with macular edema, left eye: Secondary | ICD-10-CM | POA: Diagnosis not present

## 2017-11-04 DIAGNOSIS — L97212 Non-pressure chronic ulcer of right calf with fat layer exposed: Secondary | ICD-10-CM | POA: Diagnosis not present

## 2017-11-04 DIAGNOSIS — I10 Essential (primary) hypertension: Secondary | ICD-10-CM | POA: Diagnosis not present

## 2017-11-04 DIAGNOSIS — Z7984 Long term (current) use of oral hypoglycemic drugs: Secondary | ICD-10-CM | POA: Diagnosis not present

## 2017-11-04 DIAGNOSIS — L97818 Non-pressure chronic ulcer of other part of right lower leg with other specified severity: Secondary | ICD-10-CM | POA: Diagnosis not present

## 2017-11-04 DIAGNOSIS — L97812 Non-pressure chronic ulcer of other part of right lower leg with fat layer exposed: Secondary | ICD-10-CM | POA: Diagnosis not present

## 2017-11-04 DIAGNOSIS — Z87891 Personal history of nicotine dependence: Secondary | ICD-10-CM | POA: Diagnosis not present

## 2017-11-04 DIAGNOSIS — I872 Venous insufficiency (chronic) (peripheral): Secondary | ICD-10-CM | POA: Diagnosis not present

## 2017-11-04 DIAGNOSIS — E1140 Type 2 diabetes mellitus with diabetic neuropathy, unspecified: Secondary | ICD-10-CM | POA: Diagnosis not present

## 2017-11-06 DIAGNOSIS — I10 Essential (primary) hypertension: Secondary | ICD-10-CM | POA: Diagnosis not present

## 2017-11-06 DIAGNOSIS — Z7984 Long term (current) use of oral hypoglycemic drugs: Secondary | ICD-10-CM | POA: Diagnosis not present

## 2017-11-06 DIAGNOSIS — I872 Venous insufficiency (chronic) (peripheral): Secondary | ICD-10-CM | POA: Diagnosis not present

## 2017-11-06 DIAGNOSIS — L97212 Non-pressure chronic ulcer of right calf with fat layer exposed: Secondary | ICD-10-CM | POA: Diagnosis not present

## 2017-11-06 DIAGNOSIS — E1140 Type 2 diabetes mellitus with diabetic neuropathy, unspecified: Secondary | ICD-10-CM | POA: Diagnosis not present

## 2017-11-06 DIAGNOSIS — E113412 Type 2 diabetes mellitus with severe nonproliferative diabetic retinopathy with macular edema, left eye: Secondary | ICD-10-CM | POA: Diagnosis not present

## 2017-11-09 DIAGNOSIS — I10 Essential (primary) hypertension: Secondary | ICD-10-CM | POA: Diagnosis not present

## 2017-11-09 DIAGNOSIS — E113412 Type 2 diabetes mellitus with severe nonproliferative diabetic retinopathy with macular edema, left eye: Secondary | ICD-10-CM | POA: Diagnosis not present

## 2017-11-09 DIAGNOSIS — E1140 Type 2 diabetes mellitus with diabetic neuropathy, unspecified: Secondary | ICD-10-CM | POA: Diagnosis not present

## 2017-11-09 DIAGNOSIS — Z7984 Long term (current) use of oral hypoglycemic drugs: Secondary | ICD-10-CM | POA: Diagnosis not present

## 2017-11-09 DIAGNOSIS — I872 Venous insufficiency (chronic) (peripheral): Secondary | ICD-10-CM | POA: Diagnosis not present

## 2017-11-09 DIAGNOSIS — L97212 Non-pressure chronic ulcer of right calf with fat layer exposed: Secondary | ICD-10-CM | POA: Diagnosis not present

## 2017-11-11 ENCOUNTER — Telehealth: Payer: Self-pay | Admitting: Family Medicine

## 2017-11-11 ENCOUNTER — Other Ambulatory Visit: Payer: Self-pay

## 2017-11-11 ENCOUNTER — Encounter (HOSPITAL_BASED_OUTPATIENT_CLINIC_OR_DEPARTMENT_OTHER): Payer: Medicare Other | Attending: Physician Assistant

## 2017-11-11 DIAGNOSIS — I10 Essential (primary) hypertension: Secondary | ICD-10-CM | POA: Insufficient documentation

## 2017-11-11 DIAGNOSIS — E11622 Type 2 diabetes mellitus with other skin ulcer: Secondary | ICD-10-CM | POA: Diagnosis not present

## 2017-11-11 DIAGNOSIS — Z87891 Personal history of nicotine dependence: Secondary | ICD-10-CM | POA: Insufficient documentation

## 2017-11-11 DIAGNOSIS — E1140 Type 2 diabetes mellitus with diabetic neuropathy, unspecified: Secondary | ICD-10-CM | POA: Diagnosis not present

## 2017-11-11 DIAGNOSIS — F0390 Unspecified dementia without behavioral disturbance: Secondary | ICD-10-CM | POA: Diagnosis not present

## 2017-11-11 DIAGNOSIS — L97812 Non-pressure chronic ulcer of other part of right lower leg with fat layer exposed: Secondary | ICD-10-CM | POA: Diagnosis not present

## 2017-11-11 DIAGNOSIS — E118 Type 2 diabetes mellitus with unspecified complications: Secondary | ICD-10-CM

## 2017-11-11 DIAGNOSIS — E113492 Type 2 diabetes mellitus with severe nonproliferative diabetic retinopathy without macular edema, left eye: Secondary | ICD-10-CM | POA: Diagnosis not present

## 2017-11-11 DIAGNOSIS — I872 Venous insufficiency (chronic) (peripheral): Secondary | ICD-10-CM | POA: Diagnosis not present

## 2017-11-11 MED ORDER — SAXAGLIPTIN HCL 5 MG PO TABS: 5.0000 mg | ORAL_TABLET | Freq: Every day | ORAL | 3 refills | Status: DC

## 2017-11-11 NOTE — Telephone Encounter (Signed)
Lower cost Tiering exception was denied, there are no branded name drugs in the lower cost sharing tiers to treat pt's condition.   Kim see last telephone call, the refill to [facility] was supposed to be for 90 days for the pt to get the medication for $190,  But only 30 days was sent in, Please correct, thanks

## 2017-11-11 NOTE — Telephone Encounter (Signed)
Done KH 

## 2017-11-13 DIAGNOSIS — L97212 Non-pressure chronic ulcer of right calf with fat layer exposed: Secondary | ICD-10-CM | POA: Diagnosis not present

## 2017-11-13 DIAGNOSIS — E1140 Type 2 diabetes mellitus with diabetic neuropathy, unspecified: Secondary | ICD-10-CM | POA: Diagnosis not present

## 2017-11-13 DIAGNOSIS — I872 Venous insufficiency (chronic) (peripheral): Secondary | ICD-10-CM | POA: Diagnosis not present

## 2017-11-13 DIAGNOSIS — E113412 Type 2 diabetes mellitus with severe nonproliferative diabetic retinopathy with macular edema, left eye: Secondary | ICD-10-CM | POA: Diagnosis not present

## 2017-11-13 DIAGNOSIS — I10 Essential (primary) hypertension: Secondary | ICD-10-CM | POA: Diagnosis not present

## 2017-11-13 DIAGNOSIS — Z7984 Long term (current) use of oral hypoglycemic drugs: Secondary | ICD-10-CM | POA: Diagnosis not present

## 2017-11-16 DIAGNOSIS — E1140 Type 2 diabetes mellitus with diabetic neuropathy, unspecified: Secondary | ICD-10-CM | POA: Diagnosis not present

## 2017-11-16 DIAGNOSIS — E113412 Type 2 diabetes mellitus with severe nonproliferative diabetic retinopathy with macular edema, left eye: Secondary | ICD-10-CM | POA: Diagnosis not present

## 2017-11-16 DIAGNOSIS — I872 Venous insufficiency (chronic) (peripheral): Secondary | ICD-10-CM | POA: Diagnosis not present

## 2017-11-16 DIAGNOSIS — Z7984 Long term (current) use of oral hypoglycemic drugs: Secondary | ICD-10-CM | POA: Diagnosis not present

## 2017-11-16 DIAGNOSIS — I10 Essential (primary) hypertension: Secondary | ICD-10-CM | POA: Diagnosis not present

## 2017-11-16 DIAGNOSIS — L97212 Non-pressure chronic ulcer of right calf with fat layer exposed: Secondary | ICD-10-CM | POA: Diagnosis not present

## 2017-11-18 DIAGNOSIS — L97212 Non-pressure chronic ulcer of right calf with fat layer exposed: Secondary | ICD-10-CM | POA: Diagnosis not present

## 2017-11-18 DIAGNOSIS — I872 Venous insufficiency (chronic) (peripheral): Secondary | ICD-10-CM | POA: Diagnosis not present

## 2017-11-18 DIAGNOSIS — L97812 Non-pressure chronic ulcer of other part of right lower leg with fat layer exposed: Secondary | ICD-10-CM | POA: Diagnosis not present

## 2017-11-18 DIAGNOSIS — I10 Essential (primary) hypertension: Secondary | ICD-10-CM | POA: Diagnosis not present

## 2017-11-18 DIAGNOSIS — Z87891 Personal history of nicotine dependence: Secondary | ICD-10-CM | POA: Diagnosis not present

## 2017-11-18 DIAGNOSIS — E11622 Type 2 diabetes mellitus with other skin ulcer: Secondary | ICD-10-CM | POA: Diagnosis not present

## 2017-11-18 DIAGNOSIS — E1140 Type 2 diabetes mellitus with diabetic neuropathy, unspecified: Secondary | ICD-10-CM | POA: Diagnosis not present

## 2017-11-18 DIAGNOSIS — E113492 Type 2 diabetes mellitus with severe nonproliferative diabetic retinopathy without macular edema, left eye: Secondary | ICD-10-CM | POA: Diagnosis not present

## 2017-11-20 DIAGNOSIS — I872 Venous insufficiency (chronic) (peripheral): Secondary | ICD-10-CM | POA: Diagnosis not present

## 2017-11-20 DIAGNOSIS — I10 Essential (primary) hypertension: Secondary | ICD-10-CM | POA: Diagnosis not present

## 2017-11-20 DIAGNOSIS — E113412 Type 2 diabetes mellitus with severe nonproliferative diabetic retinopathy with macular edema, left eye: Secondary | ICD-10-CM | POA: Diagnosis not present

## 2017-11-20 DIAGNOSIS — L97212 Non-pressure chronic ulcer of right calf with fat layer exposed: Secondary | ICD-10-CM | POA: Diagnosis not present

## 2017-11-20 DIAGNOSIS — Z7984 Long term (current) use of oral hypoglycemic drugs: Secondary | ICD-10-CM | POA: Diagnosis not present

## 2017-11-20 DIAGNOSIS — E1140 Type 2 diabetes mellitus with diabetic neuropathy, unspecified: Secondary | ICD-10-CM | POA: Diagnosis not present

## 2017-11-23 DIAGNOSIS — Z7984 Long term (current) use of oral hypoglycemic drugs: Secondary | ICD-10-CM | POA: Diagnosis not present

## 2017-11-23 DIAGNOSIS — E113412 Type 2 diabetes mellitus with severe nonproliferative diabetic retinopathy with macular edema, left eye: Secondary | ICD-10-CM | POA: Diagnosis not present

## 2017-11-23 DIAGNOSIS — L97212 Non-pressure chronic ulcer of right calf with fat layer exposed: Secondary | ICD-10-CM | POA: Diagnosis not present

## 2017-11-23 DIAGNOSIS — I872 Venous insufficiency (chronic) (peripheral): Secondary | ICD-10-CM | POA: Diagnosis not present

## 2017-11-23 DIAGNOSIS — I10 Essential (primary) hypertension: Secondary | ICD-10-CM | POA: Diagnosis not present

## 2017-11-23 DIAGNOSIS — E1140 Type 2 diabetes mellitus with diabetic neuropathy, unspecified: Secondary | ICD-10-CM | POA: Diagnosis not present

## 2017-11-25 DIAGNOSIS — E1140 Type 2 diabetes mellitus with diabetic neuropathy, unspecified: Secondary | ICD-10-CM | POA: Diagnosis not present

## 2017-11-25 DIAGNOSIS — Z87891 Personal history of nicotine dependence: Secondary | ICD-10-CM | POA: Diagnosis not present

## 2017-11-25 DIAGNOSIS — E11622 Type 2 diabetes mellitus with other skin ulcer: Secondary | ICD-10-CM | POA: Diagnosis not present

## 2017-11-25 DIAGNOSIS — L97212 Non-pressure chronic ulcer of right calf with fat layer exposed: Secondary | ICD-10-CM | POA: Diagnosis not present

## 2017-11-25 DIAGNOSIS — E113492 Type 2 diabetes mellitus with severe nonproliferative diabetic retinopathy without macular edema, left eye: Secondary | ICD-10-CM | POA: Diagnosis not present

## 2017-11-25 DIAGNOSIS — I10 Essential (primary) hypertension: Secondary | ICD-10-CM | POA: Diagnosis not present

## 2017-11-25 DIAGNOSIS — I872 Venous insufficiency (chronic) (peripheral): Secondary | ICD-10-CM | POA: Diagnosis not present

## 2017-11-25 DIAGNOSIS — L97812 Non-pressure chronic ulcer of other part of right lower leg with fat layer exposed: Secondary | ICD-10-CM | POA: Diagnosis not present

## 2017-11-27 DIAGNOSIS — E113412 Type 2 diabetes mellitus with severe nonproliferative diabetic retinopathy with macular edema, left eye: Secondary | ICD-10-CM | POA: Diagnosis not present

## 2017-11-27 DIAGNOSIS — Z7984 Long term (current) use of oral hypoglycemic drugs: Secondary | ICD-10-CM | POA: Diagnosis not present

## 2017-11-27 DIAGNOSIS — I872 Venous insufficiency (chronic) (peripheral): Secondary | ICD-10-CM | POA: Diagnosis not present

## 2017-11-27 DIAGNOSIS — L97212 Non-pressure chronic ulcer of right calf with fat layer exposed: Secondary | ICD-10-CM | POA: Diagnosis not present

## 2017-11-27 DIAGNOSIS — E1140 Type 2 diabetes mellitus with diabetic neuropathy, unspecified: Secondary | ICD-10-CM | POA: Diagnosis not present

## 2017-11-27 DIAGNOSIS — I10 Essential (primary) hypertension: Secondary | ICD-10-CM | POA: Diagnosis not present

## 2017-11-30 DIAGNOSIS — Z7984 Long term (current) use of oral hypoglycemic drugs: Secondary | ICD-10-CM | POA: Diagnosis not present

## 2017-11-30 DIAGNOSIS — I10 Essential (primary) hypertension: Secondary | ICD-10-CM | POA: Diagnosis not present

## 2017-11-30 DIAGNOSIS — I872 Venous insufficiency (chronic) (peripheral): Secondary | ICD-10-CM | POA: Diagnosis not present

## 2017-11-30 DIAGNOSIS — E1140 Type 2 diabetes mellitus with diabetic neuropathy, unspecified: Secondary | ICD-10-CM | POA: Diagnosis not present

## 2017-11-30 DIAGNOSIS — E113412 Type 2 diabetes mellitus with severe nonproliferative diabetic retinopathy with macular edema, left eye: Secondary | ICD-10-CM | POA: Diagnosis not present

## 2017-11-30 DIAGNOSIS — L97212 Non-pressure chronic ulcer of right calf with fat layer exposed: Secondary | ICD-10-CM | POA: Diagnosis not present

## 2017-12-02 DIAGNOSIS — I10 Essential (primary) hypertension: Secondary | ICD-10-CM | POA: Diagnosis not present

## 2017-12-02 DIAGNOSIS — L97812 Non-pressure chronic ulcer of other part of right lower leg with fat layer exposed: Secondary | ICD-10-CM | POA: Diagnosis not present

## 2017-12-02 DIAGNOSIS — I872 Venous insufficiency (chronic) (peripheral): Secondary | ICD-10-CM | POA: Diagnosis not present

## 2017-12-02 DIAGNOSIS — E11622 Type 2 diabetes mellitus with other skin ulcer: Secondary | ICD-10-CM | POA: Diagnosis not present

## 2017-12-02 DIAGNOSIS — E113492 Type 2 diabetes mellitus with severe nonproliferative diabetic retinopathy without macular edema, left eye: Secondary | ICD-10-CM | POA: Diagnosis not present

## 2017-12-02 DIAGNOSIS — Z87891 Personal history of nicotine dependence: Secondary | ICD-10-CM | POA: Diagnosis not present

## 2017-12-02 DIAGNOSIS — E1140 Type 2 diabetes mellitus with diabetic neuropathy, unspecified: Secondary | ICD-10-CM | POA: Diagnosis not present

## 2017-12-02 DIAGNOSIS — L97212 Non-pressure chronic ulcer of right calf with fat layer exposed: Secondary | ICD-10-CM | POA: Diagnosis not present

## 2017-12-04 DIAGNOSIS — L97212 Non-pressure chronic ulcer of right calf with fat layer exposed: Secondary | ICD-10-CM | POA: Diagnosis not present

## 2017-12-04 DIAGNOSIS — E113412 Type 2 diabetes mellitus with severe nonproliferative diabetic retinopathy with macular edema, left eye: Secondary | ICD-10-CM | POA: Diagnosis not present

## 2017-12-04 DIAGNOSIS — E1140 Type 2 diabetes mellitus with diabetic neuropathy, unspecified: Secondary | ICD-10-CM | POA: Diagnosis not present

## 2017-12-04 DIAGNOSIS — I872 Venous insufficiency (chronic) (peripheral): Secondary | ICD-10-CM | POA: Diagnosis not present

## 2017-12-04 DIAGNOSIS — Z7984 Long term (current) use of oral hypoglycemic drugs: Secondary | ICD-10-CM | POA: Diagnosis not present

## 2017-12-04 DIAGNOSIS — I10 Essential (primary) hypertension: Secondary | ICD-10-CM | POA: Diagnosis not present

## 2017-12-07 DIAGNOSIS — E113412 Type 2 diabetes mellitus with severe nonproliferative diabetic retinopathy with macular edema, left eye: Secondary | ICD-10-CM | POA: Diagnosis not present

## 2017-12-07 DIAGNOSIS — I10 Essential (primary) hypertension: Secondary | ICD-10-CM | POA: Diagnosis not present

## 2017-12-07 DIAGNOSIS — L97212 Non-pressure chronic ulcer of right calf with fat layer exposed: Secondary | ICD-10-CM | POA: Diagnosis not present

## 2017-12-07 DIAGNOSIS — Z7984 Long term (current) use of oral hypoglycemic drugs: Secondary | ICD-10-CM | POA: Diagnosis not present

## 2017-12-07 DIAGNOSIS — I872 Venous insufficiency (chronic) (peripheral): Secondary | ICD-10-CM | POA: Diagnosis not present

## 2017-12-07 DIAGNOSIS — E1140 Type 2 diabetes mellitus with diabetic neuropathy, unspecified: Secondary | ICD-10-CM | POA: Diagnosis not present

## 2017-12-08 ENCOUNTER — Other Ambulatory Visit: Payer: Self-pay | Admitting: Family Medicine

## 2017-12-08 DIAGNOSIS — E118 Type 2 diabetes mellitus with unspecified complications: Secondary | ICD-10-CM

## 2017-12-09 ENCOUNTER — Encounter (HOSPITAL_BASED_OUTPATIENT_CLINIC_OR_DEPARTMENT_OTHER): Payer: Medicare Other | Attending: Physician Assistant

## 2017-12-09 DIAGNOSIS — I10 Essential (primary) hypertension: Secondary | ICD-10-CM | POA: Insufficient documentation

## 2017-12-09 DIAGNOSIS — Z7984 Long term (current) use of oral hypoglycemic drugs: Secondary | ICD-10-CM | POA: Insufficient documentation

## 2017-12-09 DIAGNOSIS — E11622 Type 2 diabetes mellitus with other skin ulcer: Secondary | ICD-10-CM | POA: Diagnosis not present

## 2017-12-09 DIAGNOSIS — I872 Venous insufficiency (chronic) (peripheral): Secondary | ICD-10-CM | POA: Insufficient documentation

## 2017-12-09 DIAGNOSIS — Z87891 Personal history of nicotine dependence: Secondary | ICD-10-CM | POA: Insufficient documentation

## 2017-12-09 DIAGNOSIS — L97212 Non-pressure chronic ulcer of right calf with fat layer exposed: Secondary | ICD-10-CM | POA: Diagnosis not present

## 2017-12-09 DIAGNOSIS — E1136 Type 2 diabetes mellitus with diabetic cataract: Secondary | ICD-10-CM | POA: Insufficient documentation

## 2017-12-09 DIAGNOSIS — L97812 Non-pressure chronic ulcer of other part of right lower leg with fat layer exposed: Secondary | ICD-10-CM | POA: Insufficient documentation

## 2017-12-09 DIAGNOSIS — E113412 Type 2 diabetes mellitus with severe nonproliferative diabetic retinopathy with macular edema, left eye: Secondary | ICD-10-CM | POA: Insufficient documentation

## 2017-12-09 DIAGNOSIS — E1140 Type 2 diabetes mellitus with diabetic neuropathy, unspecified: Secondary | ICD-10-CM | POA: Diagnosis not present

## 2017-12-11 DIAGNOSIS — I872 Venous insufficiency (chronic) (peripheral): Secondary | ICD-10-CM | POA: Diagnosis not present

## 2017-12-11 DIAGNOSIS — L97212 Non-pressure chronic ulcer of right calf with fat layer exposed: Secondary | ICD-10-CM | POA: Diagnosis not present

## 2017-12-11 DIAGNOSIS — E113412 Type 2 diabetes mellitus with severe nonproliferative diabetic retinopathy with macular edema, left eye: Secondary | ICD-10-CM | POA: Diagnosis not present

## 2017-12-11 DIAGNOSIS — Z7984 Long term (current) use of oral hypoglycemic drugs: Secondary | ICD-10-CM | POA: Diagnosis not present

## 2017-12-11 DIAGNOSIS — I10 Essential (primary) hypertension: Secondary | ICD-10-CM | POA: Diagnosis not present

## 2017-12-11 DIAGNOSIS — E1140 Type 2 diabetes mellitus with diabetic neuropathy, unspecified: Secondary | ICD-10-CM | POA: Diagnosis not present

## 2017-12-14 DIAGNOSIS — L97212 Non-pressure chronic ulcer of right calf with fat layer exposed: Secondary | ICD-10-CM | POA: Diagnosis not present

## 2017-12-14 DIAGNOSIS — Z7984 Long term (current) use of oral hypoglycemic drugs: Secondary | ICD-10-CM | POA: Diagnosis not present

## 2017-12-14 DIAGNOSIS — E1140 Type 2 diabetes mellitus with diabetic neuropathy, unspecified: Secondary | ICD-10-CM | POA: Diagnosis not present

## 2017-12-14 DIAGNOSIS — I872 Venous insufficiency (chronic) (peripheral): Secondary | ICD-10-CM | POA: Diagnosis not present

## 2017-12-14 DIAGNOSIS — E113412 Type 2 diabetes mellitus with severe nonproliferative diabetic retinopathy with macular edema, left eye: Secondary | ICD-10-CM | POA: Diagnosis not present

## 2017-12-14 DIAGNOSIS — I10 Essential (primary) hypertension: Secondary | ICD-10-CM | POA: Diagnosis not present

## 2017-12-18 DIAGNOSIS — E1140 Type 2 diabetes mellitus with diabetic neuropathy, unspecified: Secondary | ICD-10-CM | POA: Diagnosis not present

## 2017-12-18 DIAGNOSIS — I10 Essential (primary) hypertension: Secondary | ICD-10-CM | POA: Diagnosis not present

## 2017-12-18 DIAGNOSIS — L97212 Non-pressure chronic ulcer of right calf with fat layer exposed: Secondary | ICD-10-CM | POA: Diagnosis not present

## 2017-12-18 DIAGNOSIS — Z7984 Long term (current) use of oral hypoglycemic drugs: Secondary | ICD-10-CM | POA: Diagnosis not present

## 2017-12-18 DIAGNOSIS — E113412 Type 2 diabetes mellitus with severe nonproliferative diabetic retinopathy with macular edema, left eye: Secondary | ICD-10-CM | POA: Diagnosis not present

## 2017-12-18 DIAGNOSIS — I872 Venous insufficiency (chronic) (peripheral): Secondary | ICD-10-CM | POA: Diagnosis not present

## 2017-12-21 DIAGNOSIS — Z7984 Long term (current) use of oral hypoglycemic drugs: Secondary | ICD-10-CM | POA: Diagnosis not present

## 2017-12-21 DIAGNOSIS — E113412 Type 2 diabetes mellitus with severe nonproliferative diabetic retinopathy with macular edema, left eye: Secondary | ICD-10-CM | POA: Diagnosis not present

## 2017-12-21 DIAGNOSIS — I10 Essential (primary) hypertension: Secondary | ICD-10-CM | POA: Diagnosis not present

## 2017-12-21 DIAGNOSIS — L97212 Non-pressure chronic ulcer of right calf with fat layer exposed: Secondary | ICD-10-CM | POA: Diagnosis not present

## 2017-12-21 DIAGNOSIS — E1140 Type 2 diabetes mellitus with diabetic neuropathy, unspecified: Secondary | ICD-10-CM | POA: Diagnosis not present

## 2017-12-21 DIAGNOSIS — I872 Venous insufficiency (chronic) (peripheral): Secondary | ICD-10-CM | POA: Diagnosis not present

## 2017-12-23 DIAGNOSIS — I10 Essential (primary) hypertension: Secondary | ICD-10-CM | POA: Diagnosis not present

## 2017-12-23 DIAGNOSIS — L97212 Non-pressure chronic ulcer of right calf with fat layer exposed: Secondary | ICD-10-CM | POA: Diagnosis not present

## 2017-12-23 DIAGNOSIS — I872 Venous insufficiency (chronic) (peripheral): Secondary | ICD-10-CM | POA: Diagnosis not present

## 2017-12-23 DIAGNOSIS — E1140 Type 2 diabetes mellitus with diabetic neuropathy, unspecified: Secondary | ICD-10-CM | POA: Diagnosis not present

## 2017-12-23 DIAGNOSIS — E113412 Type 2 diabetes mellitus with severe nonproliferative diabetic retinopathy with macular edema, left eye: Secondary | ICD-10-CM | POA: Diagnosis not present

## 2017-12-23 DIAGNOSIS — Z7984 Long term (current) use of oral hypoglycemic drugs: Secondary | ICD-10-CM | POA: Diagnosis not present

## 2017-12-25 DIAGNOSIS — Z7984 Long term (current) use of oral hypoglycemic drugs: Secondary | ICD-10-CM | POA: Diagnosis not present

## 2017-12-25 DIAGNOSIS — E113412 Type 2 diabetes mellitus with severe nonproliferative diabetic retinopathy with macular edema, left eye: Secondary | ICD-10-CM | POA: Diagnosis not present

## 2017-12-25 DIAGNOSIS — E1140 Type 2 diabetes mellitus with diabetic neuropathy, unspecified: Secondary | ICD-10-CM | POA: Diagnosis not present

## 2017-12-25 DIAGNOSIS — I872 Venous insufficiency (chronic) (peripheral): Secondary | ICD-10-CM | POA: Diagnosis not present

## 2017-12-25 DIAGNOSIS — L97212 Non-pressure chronic ulcer of right calf with fat layer exposed: Secondary | ICD-10-CM | POA: Diagnosis not present

## 2017-12-25 DIAGNOSIS — I10 Essential (primary) hypertension: Secondary | ICD-10-CM | POA: Diagnosis not present

## 2017-12-28 DIAGNOSIS — I10 Essential (primary) hypertension: Secondary | ICD-10-CM | POA: Diagnosis not present

## 2017-12-28 DIAGNOSIS — E113412 Type 2 diabetes mellitus with severe nonproliferative diabetic retinopathy with macular edema, left eye: Secondary | ICD-10-CM | POA: Diagnosis not present

## 2017-12-28 DIAGNOSIS — Z7984 Long term (current) use of oral hypoglycemic drugs: Secondary | ICD-10-CM | POA: Diagnosis not present

## 2017-12-28 DIAGNOSIS — I872 Venous insufficiency (chronic) (peripheral): Secondary | ICD-10-CM | POA: Diagnosis not present

## 2017-12-28 DIAGNOSIS — E1140 Type 2 diabetes mellitus with diabetic neuropathy, unspecified: Secondary | ICD-10-CM | POA: Diagnosis not present

## 2017-12-28 DIAGNOSIS — L97212 Non-pressure chronic ulcer of right calf with fat layer exposed: Secondary | ICD-10-CM | POA: Diagnosis not present

## 2017-12-28 DIAGNOSIS — L97811 Non-pressure chronic ulcer of other part of right lower leg limited to breakdown of skin: Secondary | ICD-10-CM | POA: Diagnosis not present

## 2017-12-30 DIAGNOSIS — I872 Venous insufficiency (chronic) (peripheral): Secondary | ICD-10-CM | POA: Diagnosis not present

## 2017-12-30 DIAGNOSIS — E11622 Type 2 diabetes mellitus with other skin ulcer: Secondary | ICD-10-CM | POA: Diagnosis not present

## 2017-12-30 DIAGNOSIS — E1136 Type 2 diabetes mellitus with diabetic cataract: Secondary | ICD-10-CM | POA: Diagnosis not present

## 2017-12-30 DIAGNOSIS — Z7984 Long term (current) use of oral hypoglycemic drugs: Secondary | ICD-10-CM | POA: Diagnosis not present

## 2017-12-30 DIAGNOSIS — E1140 Type 2 diabetes mellitus with diabetic neuropathy, unspecified: Secondary | ICD-10-CM | POA: Diagnosis not present

## 2017-12-30 DIAGNOSIS — I10 Essential (primary) hypertension: Secondary | ICD-10-CM | POA: Diagnosis not present

## 2017-12-30 DIAGNOSIS — L97819 Non-pressure chronic ulcer of other part of right lower leg with unspecified severity: Secondary | ICD-10-CM | POA: Diagnosis not present

## 2017-12-30 DIAGNOSIS — L97219 Non-pressure chronic ulcer of right calf with unspecified severity: Secondary | ICD-10-CM | POA: Diagnosis not present

## 2017-12-30 DIAGNOSIS — Z87891 Personal history of nicotine dependence: Secondary | ICD-10-CM | POA: Diagnosis not present

## 2017-12-30 DIAGNOSIS — L97212 Non-pressure chronic ulcer of right calf with fat layer exposed: Secondary | ICD-10-CM | POA: Diagnosis not present

## 2017-12-30 DIAGNOSIS — L97812 Non-pressure chronic ulcer of other part of right lower leg with fat layer exposed: Secondary | ICD-10-CM | POA: Diagnosis not present

## 2017-12-30 DIAGNOSIS — E113412 Type 2 diabetes mellitus with severe nonproliferative diabetic retinopathy with macular edema, left eye: Secondary | ICD-10-CM | POA: Diagnosis not present

## 2018-01-01 DIAGNOSIS — E1140 Type 2 diabetes mellitus with diabetic neuropathy, unspecified: Secondary | ICD-10-CM | POA: Diagnosis not present

## 2018-01-01 DIAGNOSIS — Z7984 Long term (current) use of oral hypoglycemic drugs: Secondary | ICD-10-CM | POA: Diagnosis not present

## 2018-01-01 DIAGNOSIS — I10 Essential (primary) hypertension: Secondary | ICD-10-CM | POA: Diagnosis not present

## 2018-01-01 DIAGNOSIS — L97811 Non-pressure chronic ulcer of other part of right lower leg limited to breakdown of skin: Secondary | ICD-10-CM | POA: Diagnosis not present

## 2018-01-01 DIAGNOSIS — E113412 Type 2 diabetes mellitus with severe nonproliferative diabetic retinopathy with macular edema, left eye: Secondary | ICD-10-CM | POA: Diagnosis not present

## 2018-01-01 DIAGNOSIS — L97212 Non-pressure chronic ulcer of right calf with fat layer exposed: Secondary | ICD-10-CM | POA: Diagnosis not present

## 2018-01-01 DIAGNOSIS — I872 Venous insufficiency (chronic) (peripheral): Secondary | ICD-10-CM | POA: Diagnosis not present

## 2018-01-04 DIAGNOSIS — I10 Essential (primary) hypertension: Secondary | ICD-10-CM | POA: Diagnosis not present

## 2018-01-04 DIAGNOSIS — E113412 Type 2 diabetes mellitus with severe nonproliferative diabetic retinopathy with macular edema, left eye: Secondary | ICD-10-CM | POA: Diagnosis not present

## 2018-01-04 DIAGNOSIS — Z7984 Long term (current) use of oral hypoglycemic drugs: Secondary | ICD-10-CM | POA: Diagnosis not present

## 2018-01-04 DIAGNOSIS — I872 Venous insufficiency (chronic) (peripheral): Secondary | ICD-10-CM | POA: Diagnosis not present

## 2018-01-04 DIAGNOSIS — L97212 Non-pressure chronic ulcer of right calf with fat layer exposed: Secondary | ICD-10-CM | POA: Diagnosis not present

## 2018-01-04 DIAGNOSIS — E1140 Type 2 diabetes mellitus with diabetic neuropathy, unspecified: Secondary | ICD-10-CM | POA: Diagnosis not present

## 2018-01-04 DIAGNOSIS — L97811 Non-pressure chronic ulcer of other part of right lower leg limited to breakdown of skin: Secondary | ICD-10-CM | POA: Diagnosis not present

## 2018-01-06 ENCOUNTER — Other Ambulatory Visit: Payer: Self-pay | Admitting: Family Medicine

## 2018-01-06 DIAGNOSIS — E118 Type 2 diabetes mellitus with unspecified complications: Secondary | ICD-10-CM

## 2018-01-08 ENCOUNTER — Other Ambulatory Visit: Payer: Self-pay | Admitting: Family Medicine

## 2018-01-08 DIAGNOSIS — I152 Hypertension secondary to endocrine disorders: Secondary | ICD-10-CM

## 2018-01-08 DIAGNOSIS — E1159 Type 2 diabetes mellitus with other circulatory complications: Secondary | ICD-10-CM

## 2018-01-08 DIAGNOSIS — E785 Hyperlipidemia, unspecified: Secondary | ICD-10-CM

## 2018-01-08 DIAGNOSIS — E1169 Type 2 diabetes mellitus with other specified complication: Secondary | ICD-10-CM

## 2018-01-08 DIAGNOSIS — I10 Essential (primary) hypertension: Principal | ICD-10-CM

## 2018-02-01 ENCOUNTER — Encounter: Payer: Self-pay | Admitting: Family Medicine

## 2018-02-01 ENCOUNTER — Ambulatory Visit (INDEPENDENT_AMBULATORY_CARE_PROVIDER_SITE_OTHER): Payer: Medicare Other | Admitting: Family Medicine

## 2018-02-01 VITALS — BP 142/86 | HR 75 | Temp 97.6°F | Wt 154.2 lb

## 2018-02-01 DIAGNOSIS — E113411 Type 2 diabetes mellitus with severe nonproliferative diabetic retinopathy with macular edema, right eye: Secondary | ICD-10-CM

## 2018-02-01 DIAGNOSIS — E113412 Type 2 diabetes mellitus with severe nonproliferative diabetic retinopathy with macular edema, left eye: Secondary | ICD-10-CM

## 2018-02-01 DIAGNOSIS — E1169 Type 2 diabetes mellitus with other specified complication: Secondary | ICD-10-CM

## 2018-02-01 DIAGNOSIS — H3530 Unspecified macular degeneration: Secondary | ICD-10-CM | POA: Diagnosis not present

## 2018-02-01 DIAGNOSIS — E118 Type 2 diabetes mellitus with unspecified complications: Secondary | ICD-10-CM | POA: Diagnosis not present

## 2018-02-01 DIAGNOSIS — Z23 Encounter for immunization: Secondary | ICD-10-CM | POA: Diagnosis not present

## 2018-02-01 DIAGNOSIS — I10 Essential (primary) hypertension: Secondary | ICD-10-CM

## 2018-02-01 DIAGNOSIS — E1159 Type 2 diabetes mellitus with other circulatory complications: Secondary | ICD-10-CM

## 2018-02-01 DIAGNOSIS — H9113 Presbycusis, bilateral: Secondary | ICD-10-CM

## 2018-02-01 DIAGNOSIS — E785 Hyperlipidemia, unspecified: Secondary | ICD-10-CM

## 2018-02-01 DIAGNOSIS — I152 Hypertension secondary to endocrine disorders: Secondary | ICD-10-CM

## 2018-02-01 LAB — POCT GLYCOSYLATED HEMOGLOBIN (HGB A1C): Hemoglobin A1C: 7.4 % — AB (ref 4.0–5.6)

## 2018-02-01 MED ORDER — AMLODIPINE BESYLATE 10 MG PO TABS: 10.0000 mg | ORAL_TABLET | Freq: Every day | ORAL | 3 refills | Status: DC

## 2018-02-01 NOTE — Patient Instructions (Signed)
Double up on the amlodipine pills at home until they are gone and then start the new 10 mg dosing

## 2018-02-01 NOTE — Progress Notes (Signed)
  Subjective:    Patient ID: Kristin Cook, female    DOB: [birth year], 84 y.o.   MRN: [id number]  Kristin Cook is a 84 y.o. female who presents for follow-up of Type 2 diabetes mellitus.  She continues on amlodipine, lisinopril/HCTZ.  She also continues on Crestor and having no difficulty with that.  She is taking Actos plus as well as Onglyza and having no trouble with that.  She continues to be followed by her ophthalmologist for her underlying eye disease.  She does have hearing aids but does not use them regularly.  Patient is checking home blood sugars.   Home blood sugar records: write it down  How often is blood sugars being checked: fasting and HS 137-200 Current symptoms/problems include none and have been unchanged. Daily foot checks: yes   Any foot concerns: none Last eye exam:over one year Exercise: walking  The following portions of the patient's history were reviewed and updated as appropriate: allergies, current medications, past medical history, past social history and problem list.  ROS as in subjective above.     Objective:    Physical Exam Alert and in no distress otherwise not examined.   Lab Review Diabetic Labs Latest Ref Rng & Units 10/01/2017 09/01/2017 06/05/2017 02/03/2017 10/14/2016  HbA1c 4.0 - 5.6 % 7.2(A) - 8.0 7.3 -  Microalbumin mg/L - - - 73.6 -  Micro/Creat Ratio - - - - 123.3 -  Chol 100 - 199 mg/dL - 469(G) - - -  HDL >29 mg/dL - 81 - - -  Calc LDL 0 - 99 mg/dL - 528(U) - - -  Triglycerides 0 - 149 mg/dL - 72 - - -  Creatinine 0.57 - 1.00 mg/dL - 1.32 - - 4.40   BP/Weight 10/04/2017 10/01/2017 09/01/2017 06/24/2017 06/11/2017  Systolic BP 162 120 158 130 150  Diastolic BP 89 68 92 88 84  Wt. (Lbs) - 145.2 150.2 153.2 148.6  BMI - 25.72 26.61 27.14 26.32   Foot/eye exam completion dates Latest Ref Rng & Units 09/29/2016 08/25/2016  Eye Exam No Retinopathy - Retinopathy(A)  Foot Form Completion - Done -  A1c is 7.4  Kristin Cook  reports that she has  quit smoking. She has never used smokeless tobacco. She reports that she does not drink alcohol or use drugs.     Assessment & Plan:    Controlled diabetes mellitus type 2 with complications, unspecified whether long term insulin use (HCC) - Plan: POCT glycosylated hemoglobin (Hb A1C)  Severe nonproliferative diabetic retinopathy of right eye with macular edema associated with type 2 diabetes mellitus (HCC)  Hyperlipidemia associated with type 2 diabetes mellitus (HCC) - Plan: Lipid panel  Macular degeneration of left eye, unspecified type  Hypertension associated with diabetes (HCC) - Plan: amLODipine (NORVASC) 10 MG tablet  Presbycusis of both ears  Severe nonproliferative diabetic retinopathy of left eye with macular edema associated with type 2 diabetes mellitus (HCC)  Need for influenza vaccination - Plan: Flu vaccine HIGH DOSE PF (Fluzone High dose)   1. Rx changes: none 2. Education: Reviewed 'ABCs' of diabetes management (respective goals in parentheses):  A1C (<7), blood pressure (<130/80), and cholesterol (LDL <100). 3. Compliance at present is estimated to be good. Efforts to improve compliance (if necessary) will be directed at increased exercise. 4. Follow up: 4 months Also instructed her to get batteries for her hearing aids.  She will continue to be followed by her ophthalmologist.

## 2018-02-02 LAB — LIPID PANEL
Chol/HDL Ratio: 1.7 ratio (ref 0.0–4.4)
Cholesterol, Total: 174 mg/dL (ref 100–199)
HDL: 103 mg/dL (ref >39)
LDL Calculated: 61 mg/dL (ref 0–99)
Triglycerides: 51 mg/dL (ref 0–149)
VLDL Cholesterol Cal: 10 mg/dL (ref 5–40)

## 2018-02-15 ENCOUNTER — Other Ambulatory Visit: Payer: Self-pay | Admitting: Family Medicine

## 2018-02-15 DIAGNOSIS — E118 Type 2 diabetes mellitus with unspecified complications: Secondary | ICD-10-CM

## 2018-02-25 ENCOUNTER — Other Ambulatory Visit: Payer: Self-pay

## 2018-02-25 NOTE — Patient Outreach (Signed)
[facility]) Care Management  02/25/2018  Kristin Cook [birth year] [id number]   Medication Adherence call to Kristin Cook spoke with patient she is due on Rosuvastatin 20 mg she pick this medication from [facility] on Nov.11 for a 90 days supply. patient is not due until January/2020.Kristin Cook is showing past due under [facility] Ins.   Lillia Abed CPhT Pharmacy Technician [facility] Care Management Direct Dial [phone]  Fax [phone] [email or web address]

## 2018-03-01 ENCOUNTER — Other Ambulatory Visit: Payer: Self-pay

## 2018-03-01 NOTE — Patient Outreach (Signed)
[facility]) Care Management  03/01/2018  Kristin Cook [birth year] [id number]   Medication Adherence call to Mrs. Kristin Cook left a message for patient to call back patient is due on Lisinopril/ HCTZ 20/12.5 mg.Kristin Cook is showing past due under [facility] Ins.   Lillia Abed CPhT Pharmacy Technician [facility] Care Management Direct Dial [phone]  Fax [phone] [email or web address]

## 2018-03-08 ENCOUNTER — Other Ambulatory Visit: Payer: Self-pay

## 2018-03-08 NOTE — Patient Outreach (Signed)
[facility]) Care Management  03/08/2018  Evangelina Delancey [facility] September 13, 1931 [id number]   Medication Adherence call to Mrs. Anwen Cannedy patient did not answer patient is due on Telmisartan 40 mg. Mrs. Leonor Liv is showing past due under [facility] Ins.   Lillia Abed CPhT Pharmacy Technician [facility] Management Direct Dial [phone]  Fax [phone] [email or web address]

## 2018-03-11 DIAGNOSIS — H353111 Nonexudative age-related macular degeneration, right eye, early dry stage: Secondary | ICD-10-CM | POA: Diagnosis not present

## 2018-03-11 DIAGNOSIS — H35372 Puckering of macula, left eye: Secondary | ICD-10-CM | POA: Diagnosis not present

## 2018-03-11 DIAGNOSIS — E113291 Type 2 diabetes mellitus with mild nonproliferative diabetic retinopathy without macular edema, right eye: Secondary | ICD-10-CM | POA: Diagnosis not present

## 2018-03-11 DIAGNOSIS — H353124 Nonexudative age-related macular degeneration, left eye, advanced atrophic with subfoveal involvement: Secondary | ICD-10-CM | POA: Diagnosis not present

## 2018-03-11 DIAGNOSIS — H1851 Endothelial corneal dystrophy: Secondary | ICD-10-CM | POA: Diagnosis not present

## 2018-03-11 LAB — HM DIABETES EYE EXAM

## 2018-04-12 ENCOUNTER — Telehealth: Payer: Self-pay | Admitting: Family Medicine

## 2018-04-12 MED ORDER — LINAGLIPTIN 5 MG PO TABS: 5.0000 mg | ORAL_TABLET | Freq: Every day | ORAL | 5 refills | Status: DC

## 2018-04-12 NOTE — Telephone Encounter (Signed)
Recv'd fax from [facility] that ins will not longer cover Onglyza, do you want to switch to covered alternative Januvia or Tradjenta?

## 2018-05-17 ENCOUNTER — Encounter: Payer: Self-pay | Admitting: Family Medicine

## 2018-05-17 ENCOUNTER — Ambulatory Visit (INDEPENDENT_AMBULATORY_CARE_PROVIDER_SITE_OTHER): Payer: Medicare Other | Admitting: Family Medicine

## 2018-05-17 VITALS — BP 128/82 | HR 76 | Temp 98.0°F | Ht 63.0 in | Wt 145.0 lb

## 2018-05-17 DIAGNOSIS — E118 Type 2 diabetes mellitus with unspecified complications: Secondary | ICD-10-CM

## 2018-05-17 DIAGNOSIS — E113412 Type 2 diabetes mellitus with severe nonproliferative diabetic retinopathy with macular edema, left eye: Secondary | ICD-10-CM | POA: Diagnosis not present

## 2018-05-17 DIAGNOSIS — B351 Tinea unguium: Secondary | ICD-10-CM

## 2018-05-17 DIAGNOSIS — E1159 Type 2 diabetes mellitus with other circulatory complications: Secondary | ICD-10-CM

## 2018-05-17 DIAGNOSIS — I10 Essential (primary) hypertension: Secondary | ICD-10-CM

## 2018-05-17 DIAGNOSIS — E1169 Type 2 diabetes mellitus with other specified complication: Secondary | ICD-10-CM

## 2018-05-17 DIAGNOSIS — E2839 Other primary ovarian failure: Secondary | ICD-10-CM

## 2018-05-17 DIAGNOSIS — E785 Hyperlipidemia, unspecified: Secondary | ICD-10-CM

## 2018-05-17 DIAGNOSIS — I152 Hypertension secondary to endocrine disorders: Secondary | ICD-10-CM

## 2018-05-17 DIAGNOSIS — H9113 Presbycusis, bilateral: Secondary | ICD-10-CM

## 2018-05-17 DIAGNOSIS — M1990 Unspecified osteoarthritis, unspecified site: Secondary | ICD-10-CM | POA: Diagnosis not present

## 2018-05-17 LAB — POCT GLYCOSYLATED HEMOGLOBIN (HGB A1C): Hemoglobin A1C: 7.8 % — AB (ref 4.0–5.6)

## 2018-05-17 LAB — POCT UA - MICROALBUMIN
Albumin/Creatinine Ratio, Urine, POC: 76.6
Creatinine, POC: 67.4 mg/dL
Microalbumin Ur, POC: 51.6 mg/L

## 2018-05-17 MED ORDER — GLUCOSE BLOOD VI STRP: ORAL_STRIP | 12 refills | Status: DC

## 2018-05-17 NOTE — Progress Notes (Signed)
Sharlot Gowda, MD   05/17/2018  Kristin Cook is a 85 y.o. female who presents for annual wellness visit and follow-up on chronic medical conditions.  She has the diabetes and presently being treated with Tradjenta and Actos plus.  The Tradjenta is expensive.  She continues on Crestor as well as lisinopril/HCTZ and amlodipine.  She is having no difficulty with them.  She is followed by Dr. Alden Hipp for her underlying eye disease.  She does have presbycusis but is not wearing her hearing aids stating that they hurt her ears.  She does check her blood sugars periodically.  She does complain of intermittent arthritic pain but none of any major significance.  She does not smoke or drink.  She lives with 1 of her children.  She does have a steady companion and they seem to be getting along nicely.  Immunizations and Health Maintenance Immunization History  Administered Date(s) Administered  . DTaP 03/14/2002  . Influenza Split 01/12/2013, 12/11/2014  . Influenza Whole 01/21/2007, 11/17/2007, 11/05/2010  . Influenza, High Dose Seasonal PF 11/11/2013, 12/10/2015, 02/01/2018  . Influenza-Unspecified 12/03/2016  . Pneumococcal Conjugate-13 07/01/2016  . Pneumococcal Polysaccharide-23 09/07/2006, 05/10/2013  . Tdap 08/05/2012  . Zoster 03/23/2007  . Zoster Recombinat (Shingrix) 07/16/2016   Health Maintenance Due  Topic Date Due  . DEXA SCAN  08/25/1998  . OPHTHALMOLOGY EXAM  08/25/2017  . FOOT EXAM  09/29/2017    Last Pap smear:aged out Last mammogram: unkown Last colonoscopy:07-27-2002 Last DEXA: Ordered Dentist:over a year Ophtho:feb 2020 Exercise: walking  Other doctors caring for patient include:[place]  Advanced directives:no info given Does Patient Have a Medical Advance Directive?: No Would patient like information on creating a medical advance directive?: Yes (MAU/Ambulatory/Procedural Areas - Information given)  Depression screen:  See questionnaire below.  Depression screen  [facility] 2/9 05/17/2018 10/01/2017 07/01/2016 10/22/2015 10/10/2014  Decreased Interest 0 0 0 0 0  Down, Depressed, Hopeless 0 0 0 0 0  PHQ - 2 Score 0 0 0 0 0    Fall Risk Screen: see questionnaire below. Fall Risk  05/17/2018 10/01/2017 09/01/2017 07/01/2016 10/22/2015  Falls in the past year? 0 No No No No    ADL screen:  See questionnaire below Functional Status Survey: Is the patient deaf or have difficulty hearing?: Yes Does the patient have difficulty seeing, even when wearing glasses/contacts?: No Does the patient have difficulty concentrating, remembering, or making decisions?: Yes(remebering ) Does the patient have difficulty walking or climbing stairs?: No(with rails to hold on to) Does the patient have difficulty dressing or bathing?: No Does the patient have difficulty doing errands alone such as visiting a doctor's office or shopping?: No   Review of Systems Constitutional: -, -unexpected weight change, -anorexia, -fatigue Allergy: -sneezing, -itching, -congestion Dermatology: denies changing moles, rash, lumps ENT: -runny nose, -ear pain, -sore throat,  Cardiology:  -chest pain, -palpitations, -orthopnea, Respiratory: -cough, -shortness of breath, -dyspnea on exertion, -wheezing,  Gastroenterology: -abdominal pain, -nausea, -vomiting, -diarrhea, -constipation, -dysphagia Hematology: -bleeding or bruising problems Musculoskeletal: -arthralgias, -myalgias, -joint swelling, -back pain, - Ophthalmology: -vision changes,  Urology: -dysuria, -difficulty urinating,  -urinary frequency, -urgency, incontinence Neurology: -, -numbness, , -memory loss, -falls, -dizziness    PHYSICAL EXAM:  BP 128/82 (BP Location: Left Arm, Patient Position: Sitting)   Pulse 76   Temp 98 F (36.7 C)   Ht 5\' 3"  (1.6 m)   Wt 145 lb (65.8 kg)   SpO2 97%   BMI 25.69 kg/m   General Appearance:  Alert, cooperative, no distress, appears stated age Head: Normocephalic, without obvious abnormality,  atraumatic Eyes: PERRL, conjunctiva/corneas clear, EOM's intact, fundi benign Ears: Normal TM's and external ear canals Nose: Nares normal, mucosa normal, no drainage or sinus tenderness Throat: Lips, mucosa, and tongue normal; teeth and gums normal Neck: Supple, no lymphadenopathy;  thyroid:  no enlargement/tenderness/nodules; no carotid bruit or JVD Lungs: Clear to auscultation bilaterally without wheezes, rales or ronchi; respirations unlabored Heart: Regular rate and rhythm, S1 and S2 normal, no murmur, rubor gallop Abdomen: Soft, non-tender, nondistended, normoactive bowel sounds,  no masses, no hepatosplenomegaly Extremities: No clubbing, cyanosis or edema foot exam is recorded and does show thickening of the nails Pulses: 2+ and symmetric all extremities Skin:  Skin color, texture, turgor normal, no rashes or lesions Lymph nodes: Cervical, supraclavicular, and axillary nodes normal Neurologic:  CNII-XII intact, normal strength, sensation and gait; reflexes 2+ and symmetric throughout Psych: Normal mood, affect, hygiene and grooming. A1c is 7.8 ASSESSMENT/PLAN: Severe nonproliferative diabetic retinopathy of left eye with macular edema associated with type 2 diabetes mellitus (HCC)  Hypertension associated with diabetes (HCC)  Hyperlipidemia associated with type 2 diabetes mellitus (HCC)  Controlled type 2 diabetes mellitus with complication, without long-term current use of insulin (HCC) - Plan: glucose blood (ONE TOUCH ULTRA TEST) test strip, POCT glycosylated hemoglobin (Hb A1C), POCT UA - Microalbumin  Arthritis  Presbycusis of both ears  Estrogen deficiency - Plan: DG Bone Density I will check a DEXA scan.  Also encouraged her to stay physically active and take her medications.  Explained that I could not at this point given her cheaper diabetes medication. Did encourage her to wear her hearing aids. She will continue to be followed by Dr. Dione Booze Also discussed with  thickening of the nails and at this point further intervention will not be entertained.     Immunization recommendations discussed.   Medicare Attestation I have personally reviewed: The patient's medical and social history Their use of alcohol, tobacco or illicit drugs Their current medications and supplements The patient's functional ability including ADLs,fall risks, home safety risks, cognitive, and hearing and visual impairment Diet and physical activities Evidence for depression or mood disorders  The patient's weight, height, and BMI have been recorded in the chart.  I have made referrals, counseling, and provided education to the patient based on review of the above and I have provided the patient with a written personalized care plan for preventive services.     Sharlot Gowda, MD   05/17/2018

## 2018-06-17 ENCOUNTER — Other Ambulatory Visit: Payer: Self-pay | Admitting: Family Medicine

## 2018-06-17 DIAGNOSIS — E1159 Type 2 diabetes mellitus with other circulatory complications: Secondary | ICD-10-CM

## 2018-06-17 DIAGNOSIS — I10 Essential (primary) hypertension: Principal | ICD-10-CM

## 2018-06-17 DIAGNOSIS — I152 Hypertension secondary to endocrine disorders: Secondary | ICD-10-CM

## 2018-06-18 ENCOUNTER — Other Ambulatory Visit: Payer: Self-pay | Admitting: Family Medicine

## 2018-06-18 DIAGNOSIS — E1159 Type 2 diabetes mellitus with other circulatory complications: Secondary | ICD-10-CM

## 2018-06-18 DIAGNOSIS — I10 Essential (primary) hypertension: Principal | ICD-10-CM

## 2018-06-18 DIAGNOSIS — I152 Hypertension secondary to endocrine disorders: Secondary | ICD-10-CM

## 2018-06-22 ENCOUNTER — Other Ambulatory Visit: Payer: Self-pay | Admitting: Family Medicine

## 2018-06-22 DIAGNOSIS — E118 Type 2 diabetes mellitus with unspecified complications: Secondary | ICD-10-CM

## 2018-07-01 ENCOUNTER — Telehealth: Payer: Self-pay | Admitting: Family Medicine

## 2018-07-01 NOTE — Telephone Encounter (Signed)
Discontinue the Onglyza and give her Tradjenta.

## 2018-07-01 NOTE — Telephone Encounter (Signed)
Called pharmacy & pt informed

## 2018-07-01 NOTE — Telephone Encounter (Signed)
[facility] called to see if pt is supposed to be on both Onglyza and Tradgenta.  The Kristin Cook is covered for $47 and the Onglyza requires P.A.  Please advise and I will do the P.A. and call pharmacy back

## 2018-07-01 NOTE — Telephone Encounter (Signed)
Received requester records from [facility]. Sending back for review.

## 2018-08-09 ENCOUNTER — Other Ambulatory Visit: Payer: Self-pay | Admitting: Family Medicine

## 2018-08-09 DIAGNOSIS — E118 Type 2 diabetes mellitus with unspecified complications: Secondary | ICD-10-CM

## 2018-09-18 ENCOUNTER — Other Ambulatory Visit: Payer: Self-pay | Admitting: Family Medicine

## 2018-09-18 DIAGNOSIS — E1159 Type 2 diabetes mellitus with other circulatory complications: Secondary | ICD-10-CM

## 2018-09-18 DIAGNOSIS — E1169 Type 2 diabetes mellitus with other specified complication: Secondary | ICD-10-CM

## 2018-09-18 DIAGNOSIS — I152 Hypertension secondary to endocrine disorders: Secondary | ICD-10-CM

## 2018-09-21 ENCOUNTER — Ambulatory Visit (INDEPENDENT_AMBULATORY_CARE_PROVIDER_SITE_OTHER): Payer: Medicare Other | Admitting: Family Medicine

## 2018-09-21 ENCOUNTER — Encounter: Payer: Self-pay | Admitting: Family Medicine

## 2018-09-21 ENCOUNTER — Other Ambulatory Visit: Payer: Self-pay

## 2018-09-21 VITALS — BP 126/76 | HR 77 | Temp 98.3°F | Wt 144.8 lb

## 2018-09-21 DIAGNOSIS — E785 Hyperlipidemia, unspecified: Secondary | ICD-10-CM

## 2018-09-21 DIAGNOSIS — E113412 Type 2 diabetes mellitus with severe nonproliferative diabetic retinopathy with macular edema, left eye: Secondary | ICD-10-CM

## 2018-09-21 DIAGNOSIS — I10 Essential (primary) hypertension: Secondary | ICD-10-CM

## 2018-09-21 DIAGNOSIS — E1159 Type 2 diabetes mellitus with other circulatory complications: Secondary | ICD-10-CM

## 2018-09-21 DIAGNOSIS — E118 Type 2 diabetes mellitus with unspecified complications: Secondary | ICD-10-CM | POA: Diagnosis not present

## 2018-09-21 DIAGNOSIS — H9113 Presbycusis, bilateral: Secondary | ICD-10-CM

## 2018-09-21 DIAGNOSIS — E1169 Type 2 diabetes mellitus with other specified complication: Secondary | ICD-10-CM | POA: Diagnosis not present

## 2018-09-21 DIAGNOSIS — G3184 Mild cognitive impairment, so stated: Secondary | ICD-10-CM

## 2018-09-21 DIAGNOSIS — I152 Hypertension secondary to endocrine disorders: Secondary | ICD-10-CM

## 2018-09-21 LAB — POCT GLYCOSYLATED HEMOGLOBIN (HGB A1C): Hemoglobin A1C: 7.2 % — AB (ref 4.0–5.6)

## 2018-09-21 NOTE — Progress Notes (Signed)
  Subjective:    Patient ID: Kristin Cook, female    DOB: Jan 25, 1932, 85 y.o.   MRN: [id number]  Kristin Cook is a 85 y.o. female who presents for follow-up of Type 2 diabetes mellitus.  Patient is checking home blood sugars.   Home blood sugar records: write down How often is blood sugars being checked: BID fasting post meal 137- 200 Current symptoms/problems include none at this time. Daily foot checks: yes  Any foot concerns: swelling Last eye exam: 03-11-18 Exercise: walking She continues on Actos plus.  She has also taking Crestor and having no difficulty with that.  Continues on Zestoretic and Norvasc.  She does admit to having some difficulty with memory issues.  She also has difficulty hearing but is not interested in having hearing aids.  She does have an appointment scheduled with her ophthalmologist.. The following portions of the patient's history were reviewed and updated as appropriate: allergies, current medications, past medical history, past social history and problem list.  ROS as in subjective above.     Objective:    Physical Exam Alert and in no distress MMSE 25 Hemoglobin A1c is 7.2 Blood pressure 126/76, pulse 77, temperature 98.3 F (36.8 C), weight 144 lb 12.8 oz (65.7 kg), SpO2 96 %.  Lab Review Diabetic Labs Latest Ref Rng & Units 05/17/2018 02/01/2018 10/01/2017 09/01/2017 06/05/2017  HbA1c 4.0 - 5.6 % 7.8(A) 7.4(A) 7.2(A) - 8.0  Microalbumin mg/L 51.6 - - - -  Micro/Creat Ratio - 76.6 - - - -  Chol 100 - 199 mg/dL - 174 - 241(H) -  HDL >39 mg/dL - 103 - 81 -  Calc LDL 0 - 99 mg/dL - 61 - 146(H) -  Triglycerides 0 - 149 mg/dL - 51 - 72 -  Creatinine 0.57 - 1.00 mg/dL - - - 0.66 -   BP/Weight 09/21/2018 05/17/2018 02/01/2018 10/04/2017 0/92/3300  Systolic BP 762 263 335 456 256  Diastolic BP 76 82 86 89 68  Wt. (Lbs) 144.8 145 154.2 - 145.2  BMI 25.65 25.69 27.32 - 25.72   Foot/eye exam completion dates Latest Ref Rng & Units 05/17/2018 03/11/2018  Eye  Exam No Retinopathy - Retinopathy(A)  Foot Form Completion - Done -    Kristin Cook  reports that she has quit smoking. She has never used smokeless tobacco. She reports that she does not drink alcohol or use drugs.     Assessment & Plan:     Encounter Diagnoses  Name Primary?  . Severe nonproliferative diabetic retinopathy of left eye with macular edema associated with type 2 diabetes mellitus ([place]) Yes  . Presbycusis of both ears   . Hypertension associated with diabetes ([place])   . Hyperlipidemia associated with type 2 diabetes mellitus ([place])   . Controlled type 2 diabetes mellitus with complication, without long-term current use of insulin ([place])   . Mild cognitive impairment with memory loss      1. Rx changes: none 2. Education: Reviewed 'ABCs' of diabetes management (respective goals in parentheses):  A1C (<7), blood pressure (<130/80), and cholesterol (LDL <100). 3. Compliance at present is estimated to be good. Efforts to improve compliance (if necessary) will be directed at nothing. 4. Follow up: 4 months Encouraged her to follow-up with getting the hearing aid batteries changed. Also discussed the mild cognitive impairment with her but at this point do not feel the need to treat it.  She was comfortable with that.

## 2018-11-12 ENCOUNTER — Other Ambulatory Visit: Payer: Self-pay | Admitting: Family Medicine

## 2018-11-12 DIAGNOSIS — E118 Type 2 diabetes mellitus with unspecified complications: Secondary | ICD-10-CM

## 2018-12-01 DIAGNOSIS — E119 Type 2 diabetes mellitus without complications: Secondary | ICD-10-CM | POA: Diagnosis not present

## 2018-12-01 DIAGNOSIS — H1851 Endothelial corneal dystrophy: Secondary | ICD-10-CM | POA: Diagnosis not present

## 2018-12-01 DIAGNOSIS — H353124 Nonexudative age-related macular degeneration, left eye, advanced atrophic with subfoveal involvement: Secondary | ICD-10-CM | POA: Diagnosis not present

## 2018-12-01 DIAGNOSIS — Z961 Presence of intraocular lens: Secondary | ICD-10-CM | POA: Diagnosis not present

## 2018-12-01 DIAGNOSIS — H35372 Puckering of macula, left eye: Secondary | ICD-10-CM | POA: Diagnosis not present

## 2018-12-01 LAB — HM DIABETES EYE EXAM

## 2019-01-07 ENCOUNTER — Other Ambulatory Visit: Payer: Self-pay | Admitting: Family Medicine

## 2019-01-07 DIAGNOSIS — I152 Hypertension secondary to endocrine disorders: Secondary | ICD-10-CM

## 2019-01-07 DIAGNOSIS — E1159 Type 2 diabetes mellitus with other circulatory complications: Secondary | ICD-10-CM

## 2019-01-07 DIAGNOSIS — I10 Essential (primary) hypertension: Secondary | ICD-10-CM

## 2019-01-18 ENCOUNTER — Other Ambulatory Visit: Payer: Self-pay | Admitting: Family Medicine

## 2019-01-19 ENCOUNTER — Other Ambulatory Visit: Payer: Self-pay

## 2019-01-19 ENCOUNTER — Encounter: Payer: Self-pay | Admitting: Family Medicine

## 2019-01-19 ENCOUNTER — Ambulatory Visit (INDEPENDENT_AMBULATORY_CARE_PROVIDER_SITE_OTHER): Payer: Medicare Other | Admitting: Family Medicine

## 2019-01-19 VITALS — Ht 63.0 in | Wt 146.0 lb

## 2019-01-19 DIAGNOSIS — R0609 Other forms of dyspnea: Secondary | ICD-10-CM

## 2019-01-19 DIAGNOSIS — R531 Weakness: Secondary | ICD-10-CM

## 2019-01-19 DIAGNOSIS — R42 Dizziness and giddiness: Secondary | ICD-10-CM | POA: Diagnosis not present

## 2019-01-19 DIAGNOSIS — R0600 Dyspnea, unspecified: Secondary | ICD-10-CM

## 2019-01-19 DIAGNOSIS — H9113 Presbycusis, bilateral: Secondary | ICD-10-CM | POA: Diagnosis not present

## 2019-01-19 DIAGNOSIS — E118 Type 2 diabetes mellitus with unspecified complications: Secondary | ICD-10-CM

## 2019-01-19 DIAGNOSIS — G3184 Mild cognitive impairment, so stated: Secondary | ICD-10-CM

## 2019-01-19 NOTE — Progress Notes (Signed)
Start time: 11:48 End time: 12:15 with various phone calls (daughter-in-law, patient)  Virtual Visit via Telephone Note  I connected with [place] on 01/19/19 by telephone and verified that I am speaking with the correct person using two identifiers.  Location: Patient: home Provider: office   I discussed the limitations, risks, security and privacy concerns of performing an evaluation and management service by telephone and the availability of in person appointments. I also discussed with the patient that there may be a patient responsible charge related to this service. The patient expressed understanding and agreed to proceed.   History of Present Illness:  Chief Complaint  Patient presents with  . Cough    SOB when she walks, has been coughing for a "good while".  Not sure if she has had any fever. No thermometer. She lives in a "mother-in law suite." Is very hard of hearing and somewhat confused. States she is feeling a little better today (sounds SOB). Her daughter was too scared to go in and help her.   Patient is hard of hearing, wasn't aware that an appointment was made for her by her daughter-in-law [name], so spoke with family to try and get history before calling her back (chart reports some cognitive impairment, in addition to hearing impairment).  First spoke to [name] (# was given to me as that of [name], daughter-in-law, but was the son's number)--he wasn't aware of anything new.  Just that she got a hearing aid yesterday  Then spoke with [name] (daughter-in-law who scheduled the appt). She reports that off and on she is having some dizzy spells, lightheaded.  A couple of weeks ago she fell related to this, no injury.  No syncope.  Epimenio Sarin took her to get hearing aids, and she was very winded walking across the room.  She seems to be breathing hard today as well, when walking across the room. She isn't around her much, can't give much  More detail.   Doesn't think her legs swell.  Doesn't think she has had fever or chills. She goes out daily, to a friend's house and they go out, unsure of what/where. Doesn't think they eat out. (per patient--they go to flea market, wear masks, don't eat out)  She "coughs all the time", just at night when laying down.  Not new, and denies any recent change.  She has HTN and diabetes.  There is no BP monitor at home.  Review of chart-- CXR 03/2013 normal No cardiac evaluation in past (though DOE is listed in [facility]) Last labs were 01/2018 (other than A1c 7.2 in July). She has appt with Dr. Redmond School scheduled for Monday.  In conversation with the patient, today she reports feeling " a little bit weak" She has been sitting and laying around most of the morning (waiting for this phone call, raining out). She denies any complaints, but when pressed, she reports that she sometimes feels like she is going to pass out if she walks--feels like she will pass out or fall.  Feels dizzy and out of breath when walking.  Intermittent, not every time. Sugars have been high, in the 180 range for the last couple of weeks. She denies symptoms of infection--No known fever. Has cough, not new.  Phlegm is white/clear. Cough hasn't changed.  No nausea, vomiting, diarrhea. Normal smell/taste. Goes out every day--flea market, wears mask. She denies any bleeding.   Symptoms for about a month or so, per patient. The reported fall that was confirmed  by [name] was about 2 weeks ago (with no injury).  PMH, PSH, SH reviewed  Outpatient Encounter Medications as of 01/19/2019  Medication Sig  . amLODipine (NORVASC) 10 MG tablet Take 1 tablet (10 mg total) by mouth daily.  Marland Kitchen glucose blood (ONE TOUCH ULTRA TEST) test strip Use as instructed  . lisinopril-hydrochlorothiazide (ZESTORETIC) 20-12.5 MG tablet TAKE 1 TABLET BY MOUTH DAILY  . pioglitazone-metformin (ACTOPLUS MET) 15-850 MG tablet TAKE 1 TABLET BY MOUTH TWICE DAILY WITH A MEAL   . rosuvastatin (CRESTOR) 20 MG tablet TAKE 1 TABLET(20 MG) BY MOUTH DAILY  . TRADJENTA 5 MG TABS tablet TAKE 1 TABLET(5 MG) BY MOUTH DAILY  . bismuth subsalicylate (PEPTO BISMOL) 262 MG/15ML suspension Take 30 mLs by mouth every 6 (six) hours as needed.  . diphenhydramine-acetaminophen (TYLENOL PM) 25-500 MG TABS Take 1 tablet by mouth at bedtime as needed.  . doxycycline (VIBRAMYCIN) 100 MG capsule Take 1 capsule (100 mg total) by mouth 2 (two) times daily. (Patient not taking: Reported on 02/01/2018)  . loperamide (IMODIUM) 2 MG capsule Take 2 mg by mouth as needed for diarrhea or loose stools.  . mupirocin ointment (BACTROBAN) 2 % Apply 1 application topically 2 (two) times daily. (Patient not taking: Reported on 02/01/2018)  . [DISCONTINUED] TRADJENTA 5 MG TABS tablet    No facility-administered encounter medications on file as of 01/19/2019.       Observations/Objective:  Ht '5\' 3"'  (1.6 m)   Wt 146 lb (66.2 kg)   BMI 25.86 kg/m  Wt Readings from Last 3 Encounters:  01/19/19 146 lb (66.2 kg)  09/21/18 144 lb 12.8 oz (65.7 kg)  05/17/18 145 lb (65.8 kg)    Exam is limited, due to telephone nature of the visit. Nurse reported that patient sounded very winded after returning from the bedroom where she went to get on the scale. When I spoke to her (over 15-30 mins later), she was comfortable, speaking easily, in no distress.  She did not cough during the visit.   Assessment and Plan:  Weakness - feeling dizzy when walking. Sugars are high, unable to check BP  Dizziness - with walking, none at rest  DOE (dyspnea on exertion) - seems to be a little more recently, though history is difficult to obtain  Presbycusis of both ears - got new hearing aid yesterday  Controlled type 2 diabetes mellitus with complication, without long-term current use of insulin (HCC) - sugars have been higher recently.  Has appt Monday with Dr. Redmond School for routine DM f/u  Mild cognitive impairment  with memory loss  Spoke with patient, [name] and Dr. Redmond School. Patient is comfortable now, doesn't feel like she is significant different than she has been the last couple of weeks, and prefers to wait until her visit as scheduled on Monday (rather than being evaluated here today).  Discussed that she should go to the ER if any worsening dizziness or shortness of breath.  [name] does report that she would be willing to take her for evaluation if worsening.  Encouraged her to stay well hydrated. Ddx reviewed with [name], pt--concerned for cardiac issues, CHF (though no edema, weight stabe), anemia, among other serious problems, and reasons to seek care sooner than waiting for [facility] appointment.   Follow Up Instructions:    I discussed the assessment and treatment plan with the patient. The patient was provided an opportunity to ask questions and all were answered. The patient agreed with the plan and demonstrated an understanding of  the instructions.   The patient was advised to call back or seek an in-person evaluation if the symptoms worsen or if the condition fails to improve as anticipated.  I provided 27 minutes of non-face-to-face time during this encounter (reviewing chart, talking with son, daughter-in-law, JCL)   Vikki Ports, MD

## 2019-01-24 ENCOUNTER — Ambulatory Visit (INDEPENDENT_AMBULATORY_CARE_PROVIDER_SITE_OTHER): Payer: Medicare Other | Admitting: Family Medicine

## 2019-01-24 ENCOUNTER — Other Ambulatory Visit: Payer: Self-pay

## 2019-01-24 VITALS — BP 136/76 | HR 79 | Temp 97.7°F | Ht 62.0 in | Wt 147.0 lb

## 2019-01-24 DIAGNOSIS — E1169 Type 2 diabetes mellitus with other specified complication: Secondary | ICD-10-CM

## 2019-01-24 DIAGNOSIS — R609 Edema, unspecified: Secondary | ICD-10-CM

## 2019-01-24 DIAGNOSIS — E118 Type 2 diabetes mellitus with unspecified complications: Secondary | ICD-10-CM

## 2019-01-24 DIAGNOSIS — E1159 Type 2 diabetes mellitus with other circulatory complications: Secondary | ICD-10-CM

## 2019-01-24 DIAGNOSIS — E785 Hyperlipidemia, unspecified: Secondary | ICD-10-CM

## 2019-01-24 DIAGNOSIS — I10 Essential (primary) hypertension: Secondary | ICD-10-CM | POA: Diagnosis not present

## 2019-01-24 DIAGNOSIS — E113412 Type 2 diabetes mellitus with severe nonproliferative diabetic retinopathy with macular edema, left eye: Secondary | ICD-10-CM

## 2019-01-24 DIAGNOSIS — I152 Hypertension secondary to endocrine disorders: Secondary | ICD-10-CM

## 2019-01-24 LAB — POCT GLYCOSYLATED HEMOGLOBIN (HGB A1C): Hemoglobin A1C: 6.8 % — AB (ref 4.0–5.6)

## 2019-01-24 LAB — POCT UA - MICROALBUMIN
Albumin/Creatinine Ratio, Urine, POC: 98
Creatinine, POC: 49.5 mg/dL
Microalbumin Ur, POC: 48.5 mg/L

## 2019-01-24 NOTE — Progress Notes (Signed)
  Subjective:    Patient ID: Kristin Cook, female    DOB: 1932/01/09, 85 y.o.   MRN: [id number]  Kristin Cook is a 85 y.o. female who presents for follow-up of Type 2 diabetes mellitus.  Home blood sugar records: meter record 137-200 fasting Current symptoms/problems include swelling in her legs for the last month or so.  No chest pain, shortness of breath, PND.  No change with position. Daily foot checks: yes   Any foot concerns: none Exercise: walking Diet: reg. Diet She continues on Actos plus and Tradjenta.  She has no difficulties with that.  She is also taking Crestor and having no aches or pains.  Continues on lisinopril/HCTZ and amlodipine. The following portions of the patient's history were reviewed and updated as appropriate: allergies, current medications, past medical history, past social history and problem list.  ROS as in subjective above.     Objective:    Physical Exam Alert and in no distress foot exam normal except for decreased pulses   Lab Review Diabetic Labs Latest Ref Rng & Units 09/21/2018 05/17/2018 02/01/2018 10/01/2017 09/01/2017  HbA1c 4.0 - 5.6 % 7.2(A) 7.8(A) 7.4(A) 7.2(A) -  Microalbumin mg/L - 51.6 - - -  Micro/Creat Ratio - - 76.6 - - -  Chol 100 - 199 mg/dL - - 174 - 241(H)  HDL >39 mg/dL - - 103 - 81  Calc LDL 0 - 99 mg/dL - - 61 - 146(H)  Triglycerides 0 - 149 mg/dL - - 51 - 72  Creatinine 0.57 - 1.00 mg/dL - - - - 0.66   BP/Weight 01/19/2019 09/21/2018 05/17/2018 02/01/2018 04/19/4707  Systolic BP - 628 366 294 765  Diastolic BP - 76 82 86 89  Wt. (Lbs) 146 144.8 145 154.2 -  BMI 25.86 25.65 25.69 27.32 -   Foot/eye exam completion dates Latest Ref Rng & Units 12/01/2018 05/17/2018  Eye Exam No Retinopathy No Retinopathy -  Foot Form Completion - - Done   Hemoglobin A1c is 6.8 Kristin Cook  reports that she has quit smoking. She has never used smokeless tobacco. She reports that she does not drink alcohol or use drugs.     Assessment & Plan:     Controlled type 2 diabetes mellitus with complication, without long-term current use of insulin ([place]) - Plan: POCT UA - Microalbumin, Lipid panel, Comprehensive metabolic panel, CBC with Differential, VAS [place] ABI WITH/WO TBI, HgB A1c  Hypertension associated with diabetes ([place]) - Plan: Comprehensive metabolic panel, CBC with Differential  Hyperlipidemia associated with type 2 diabetes mellitus ([place]) - Plan: Lipid panel  Severe nonproliferative diabetic retinopathy of left eye with macular edema associated with type 2 diabetes mellitus (HCC)  Peripheral edema   1. Rx changes: none 2. Education: Reviewed 'ABCs' of diabetes management (respective goals in parentheses):  A1C (<7), blood pressure (<130/80), and cholesterol (LDL <100). 3. Compliance at present is estimated to be good. Efforts to improve compliance (if necessary) will be directed at increased exercise. 4. Follow up: 4 months We will also order ABI to further assess the pulses.

## 2019-01-25 LAB — COMPREHENSIVE METABOLIC PANEL
ALT: 11 IU/L (ref 0–32)
AST: 13 IU/L (ref 0–40)
Albumin/Globulin Ratio: 1.8 (ref 1.2–2.2)
Albumin: 4.2 g/dL (ref 3.6–4.6)
Alkaline Phosphatase: 94 IU/L (ref 39–117)
BUN/Creatinine Ratio: 15 (ref 12–28)
BUN: 11 mg/dL (ref 8–27)
Bilirubin Total: 0.4 mg/dL (ref 0.0–1.2)
CO2: 25 mmol/L (ref 20–29)
Calcium: 9.6 mg/dL (ref 8.7–10.3)
Chloride: 93 mmol/L — ABNORMAL LOW (ref 96–106)
Creatinine, Ser: 0.72 mg/dL (ref 0.57–1.00)
GFR calc Af Amer: 88 mL/min/{1.73_m2} (ref >59)
GFR calc non Af Amer: 77 mL/min/{1.73_m2} (ref >59)
Globulin, Total: 2.4 g/dL (ref 1.5–4.5)
Glucose: 177 mg/dL — ABNORMAL HIGH (ref 65–99)
Potassium: 4.1 mmol/L (ref 3.5–5.2)
Sodium: 134 mmol/L (ref 134–144)
Total Protein: 6.6 g/dL (ref 6.0–8.5)

## 2019-01-25 LAB — CBC WITH DIFFERENTIAL/PLATELET
Basophils Absolute: 0.1 10*3/uL (ref 0.0–0.2)
Basos: 1 %
EOS (ABSOLUTE): 0.2 10*3/uL (ref 0.0–0.4)
Eos: 3 %
Hematocrit: 37.8 % (ref 34.0–46.6)
Hemoglobin: 12.7 g/dL (ref 11.1–15.9)
Immature Grans (Abs): 0.1 10*3/uL (ref 0.0–0.1)
Immature Granulocytes: 1 %
Lymphocytes Absolute: 1.4 10*3/uL (ref 0.7–3.1)
Lymphs: 17 %
MCH: 30.2 pg (ref 26.6–33.0)
MCHC: 33.6 g/dL (ref 31.5–35.7)
MCV: 90 fL (ref 79–97)
Monocytes Absolute: 0.7 10*3/uL (ref 0.1–0.9)
Monocytes: 9 %
Neutrophils Absolute: 5.7 10*3/uL (ref 1.4–7.0)
Neutrophils: 69 %
Platelets: 380 10*3/uL (ref 150–450)
RBC: 4.2 x10E6/uL (ref 3.77–5.28)
RDW: 12.4 % (ref 11.7–15.4)
WBC: 8.2 10*3/uL (ref 3.4–10.8)

## 2019-01-25 LAB — LIPID PANEL
Chol/HDL Ratio: 1.9 ratio (ref 0.0–4.4)
Cholesterol, Total: 131 mg/dL (ref 100–199)
HDL: 69 mg/dL (ref >39)
LDL Chol Calc (NIH): 49 mg/dL (ref 0–99)
Triglycerides: 61 mg/dL (ref 0–149)
VLDL Cholesterol Cal: 13 mg/dL (ref 5–40)

## 2019-02-07 ENCOUNTER — Other Ambulatory Visit: Payer: Self-pay

## 2019-02-07 NOTE — Patient Outreach (Signed)
[facility]) Care Management  02/07/2019  Sharlene Mccluskey [facility] 1931/08/14 [id number]   Medication Adherence call to Mrs. Kristin Cook patient did not want to engage ,patient hung uo the phone is past due on Rosuvastatin 20 mg and Lisinopril/Hctz 20/12.5 mg under [place].   [place] Management Direct Dial [phone]  Fax [phone] [email or web address]

## 2019-02-15 ENCOUNTER — Other Ambulatory Visit: Payer: Self-pay | Admitting: Family Medicine

## 2019-02-15 DIAGNOSIS — E118 Type 2 diabetes mellitus with unspecified complications: Secondary | ICD-10-CM

## 2019-03-19 ENCOUNTER — Other Ambulatory Visit: Payer: Self-pay | Admitting: Family Medicine

## 2019-03-19 DIAGNOSIS — E1159 Type 2 diabetes mellitus with other circulatory complications: Secondary | ICD-10-CM

## 2019-03-19 DIAGNOSIS — I152 Hypertension secondary to endocrine disorders: Secondary | ICD-10-CM

## 2019-04-05 ENCOUNTER — Ambulatory Visit: Payer: Medicare Other

## 2019-04-22 ENCOUNTER — Ambulatory Visit: Payer: Medicare Other | Attending: Internal Medicine

## 2019-04-22 ENCOUNTER — Ambulatory Visit: Payer: Medicare Other

## 2019-04-22 DIAGNOSIS — Z23 Encounter for immunization: Secondary | ICD-10-CM | POA: Insufficient documentation

## 2019-04-22 NOTE — Progress Notes (Signed)
   Covid-19 Vaccination Clinic  Name:  WESSIE SHANKS    MRN: [id number] DOB: [birth year]  04/22/2019  Ms. Sleight was observed post Covid-19 immunization for 15 minutes without incidence. She was provided with Vaccine Information Sheet and instruction to access the V-Safe system.   Ms. Burggraf was instructed to call 911 with any severe reactions post vaccine: Marland Kitchen Difficulty breathing  . Swelling of your face and throat  . A fast heartbeat  . A bad rash all over your body  . Dizziness and weakness    Immunizations Administered    Name Date Dose VIS Date Route   Pfizer COVID-19 Vaccine 04/22/2019  1:07 PM 0.3 mL 02/18/2019 Intramuscular   Manufacturer: [employer], [employer]   Lot: [id number]   NDC: [address]-1

## 2019-05-13 ENCOUNTER — Ambulatory Visit: Payer: Medicare Other | Attending: Internal Medicine

## 2019-05-13 DIAGNOSIS — Z23 Encounter for immunization: Secondary | ICD-10-CM | POA: Insufficient documentation

## 2019-05-13 NOTE — Progress Notes (Signed)
   Covid-19 Vaccination Clinic  Name:  Kristin Cook    MRN: [id number] DOB: Aug 13, 1931  05/13/2019  [name] was observed post Covid-19 immunization for 15 minutes without incident. She was provided with Vaccine Information Sheet and instruction to access the V-Safe system.   [name] was instructed to call 911 with any severe reactions post vaccine: Marland Kitchen Difficulty breathing  . Swelling of face and throat  . A fast heartbeat  . A bad rash all over body  . Dizziness and weakness   Immunizations Administered    Name Date Dose VIS Date Route   Pfizer COVID-19 Vaccine 05/13/2019 10:58 AM 0.3 mL 02/18/2019 Intramuscular   Manufacturer: [employer], [employer]   Lot: [id number]   NDC: [address]-5

## 2019-05-26 ENCOUNTER — Ambulatory Visit (INDEPENDENT_AMBULATORY_CARE_PROVIDER_SITE_OTHER): Payer: Medicare Other | Admitting: Family Medicine

## 2019-05-26 ENCOUNTER — Other Ambulatory Visit: Payer: Self-pay

## 2019-05-26 ENCOUNTER — Encounter: Payer: Self-pay | Admitting: Family Medicine

## 2019-05-26 VITALS — BP 126/76 | HR 84 | Temp 96.9°F | Wt 142.8 lb

## 2019-05-26 DIAGNOSIS — E113412 Type 2 diabetes mellitus with severe nonproliferative diabetic retinopathy with macular edema, left eye: Secondary | ICD-10-CM

## 2019-05-26 DIAGNOSIS — I152 Hypertension secondary to endocrine disorders: Secondary | ICD-10-CM

## 2019-05-26 DIAGNOSIS — E1169 Type 2 diabetes mellitus with other specified complication: Secondary | ICD-10-CM | POA: Diagnosis not present

## 2019-05-26 DIAGNOSIS — I10 Essential (primary) hypertension: Secondary | ICD-10-CM

## 2019-05-26 DIAGNOSIS — E1159 Type 2 diabetes mellitus with other circulatory complications: Secondary | ICD-10-CM

## 2019-05-26 DIAGNOSIS — H9113 Presbycusis, bilateral: Secondary | ICD-10-CM | POA: Diagnosis not present

## 2019-05-26 DIAGNOSIS — E118 Type 2 diabetes mellitus with unspecified complications: Secondary | ICD-10-CM | POA: Diagnosis not present

## 2019-05-26 DIAGNOSIS — E785 Hyperlipidemia, unspecified: Secondary | ICD-10-CM

## 2019-05-26 LAB — POCT GLYCOSYLATED HEMOGLOBIN (HGB A1C): Hemoglobin A1C: 7.9 % — AB (ref 4.0–5.6)

## 2019-05-26 NOTE — Progress Notes (Signed)
  Subjective:    Patient ID: Kristin Cook, female    DOB: 1931-09-08, 86 y.o.   MRN: [id number]  Kristin Cook is a 86 y.o. female who presents for follow-up of Type 2 diabetes mellitus.  Home blood sugar records: meter recorded Avg 168 fasting and post meal Current symptoms/problems include none at this time . Daily foot checks:yes   Any foot concerns: none Exercise: walking  Diet: alright per pt  She has not followed up with ophthalmology recently.  She continues on amlodipine as well as lisinopril/HCTZ and is having no swelling or cough.  She continues on Actos plus as well as Tradjenta and blood sugars are recorded as above.  Crestor is causing no aches or pains.  The following portions of the patient's history were reviewed and updated as appropriate: allergies, current medications, past medical history, past social history and problem list.  ROS as in subjective above.     Objective:    Physical Exam Alert  and in no distress otherwise not examined. Hemoglobin A1c is 7.9  Lab Review Diabetic Labs Latest Ref Rng & Units 01/24/2019 09/21/2018 05/17/2018 02/01/2018 10/01/2017  HbA1c 4.0 - 5.6 % 6.8(A) 7.2(A) 7.8(A) 7.4(A) 7.2(A)  Microalbumin mg/L 48.5 - 51.6 - -  Micro/Creat Ratio - 98.0 - 76.6 - -  Chol 100 - 199 mg/dL 086 - - 578 -  HDL >46 mg/dL 69 - - 962 -  Calc LDL 0 - 99 mg/dL 49 - - 61 -  Triglycerides 0 - 149 mg/dL 61 - - 51 -  Creatinine 0.57 - 1.00 mg/dL 9.52 - - - -   BP/Weight 01/24/2019 01/19/2019 09/21/2018 05/17/2018 02/01/2018  Systolic BP 136 - 126 128 142  Diastolic BP 76 - 76 82 86  Wt. (Lbs) 147 146 144.8 145 154.2  BMI 26.89 25.86 25.65 25.69 27.32   Foot/eye exam completion dates Latest Ref Rng & Units 12/01/2018 05/17/2018  Eye Exam No Retinopathy No Retinopathy -  Foot Form Completion - - Done    Edith  reports that she has quit smoking. She has never used smokeless tobacco. She reports that she does not drink alcohol or use drugs.       Assessment & Plan:     Controlled type 2 diabetes mellitus with complication, without long-term current use of insulin (HCC) - Plan: POCT glycosylated hemoglobin (Hb A1C)  Hypertension associated with diabetes (HCC)  Hyperlipidemia associated with type 2 diabetes mellitus (HCC)  Severe nonproliferative diabetic retinopathy of left eye with macular edema associated with type 2 diabetes mellitus (HCC)  Presbycusis of both ears   1. Rx changes: none 2. Education: Reviewed 'ABCs' of diabetes management (respective goals in parentheses):  A1C (<7), blood pressure (<130/80), and cholesterol (LDL <100). 3. Compliance at present is estimated to be fair. Efforts to improve compliance (if necessary) will be directed at increased exercise.  Also make further adjustments in diet.  She is also to set up an appointment to see ophthalmology. 4. Follow up: 4 months

## 2019-06-25 ENCOUNTER — Other Ambulatory Visit: Payer: Self-pay | Admitting: Family Medicine

## 2019-06-25 DIAGNOSIS — E118 Type 2 diabetes mellitus with unspecified complications: Secondary | ICD-10-CM

## 2019-07-07 ENCOUNTER — Other Ambulatory Visit: Payer: Self-pay | Admitting: Family Medicine

## 2019-07-07 DIAGNOSIS — E118 Type 2 diabetes mellitus with unspecified complications: Secondary | ICD-10-CM

## 2019-09-13 ENCOUNTER — Encounter: Payer: Self-pay | Admitting: Family Medicine

## 2019-09-13 ENCOUNTER — Ambulatory Visit (INDEPENDENT_AMBULATORY_CARE_PROVIDER_SITE_OTHER): Payer: Medicare Other | Admitting: Family Medicine

## 2019-09-13 ENCOUNTER — Other Ambulatory Visit: Payer: Self-pay

## 2019-09-13 VITALS — BP 130/74 | HR 84 | Temp 98.0°F | Ht 61.0 in | Wt 139.8 lb

## 2019-09-13 DIAGNOSIS — E1159 Type 2 diabetes mellitus with other circulatory complications: Secondary | ICD-10-CM | POA: Diagnosis not present

## 2019-09-13 DIAGNOSIS — E1169 Type 2 diabetes mellitus with other specified complication: Secondary | ICD-10-CM

## 2019-09-13 DIAGNOSIS — I10 Essential (primary) hypertension: Secondary | ICD-10-CM

## 2019-09-13 DIAGNOSIS — M1990 Unspecified osteoarthritis, unspecified site: Secondary | ICD-10-CM

## 2019-09-13 DIAGNOSIS — E118 Type 2 diabetes mellitus with unspecified complications: Secondary | ICD-10-CM | POA: Diagnosis not present

## 2019-09-13 DIAGNOSIS — G3184 Mild cognitive impairment, so stated: Secondary | ICD-10-CM

## 2019-09-13 DIAGNOSIS — H9113 Presbycusis, bilateral: Secondary | ICD-10-CM | POA: Diagnosis not present

## 2019-09-13 DIAGNOSIS — I152 Hypertension secondary to endocrine disorders: Secondary | ICD-10-CM

## 2019-09-13 DIAGNOSIS — E113412 Type 2 diabetes mellitus with severe nonproliferative diabetic retinopathy with macular edema, left eye: Secondary | ICD-10-CM

## 2019-09-13 DIAGNOSIS — Z0000 Encounter for general adult medical examination without abnormal findings: Secondary | ICD-10-CM

## 2019-09-13 DIAGNOSIS — E2839 Other primary ovarian failure: Secondary | ICD-10-CM

## 2019-09-13 DIAGNOSIS — Z13820 Encounter for screening for osteoporosis: Secondary | ICD-10-CM

## 2019-09-13 DIAGNOSIS — E785 Hyperlipidemia, unspecified: Secondary | ICD-10-CM

## 2019-09-13 LAB — POCT GLYCOSYLATED HEMOGLOBIN (HGB A1C): Hemoglobin A1C: 8.1 % — AB (ref 4.0–5.6)

## 2019-09-13 MED ORDER — ROSUVASTATIN CALCIUM 20 MG PO TABS: ORAL_TABLET | ORAL | 3 refills | Status: DC

## 2019-09-13 MED ORDER — PIOGLITAZONE HCL-METFORMIN HCL 15-850 MG PO TABS: 1.0000 | ORAL_TABLET | Freq: Two times a day (BID) | ORAL | 1 refills | Status: DC

## 2019-09-13 MED ORDER — LINAGLIPTIN 5 MG PO TABS: ORAL_TABLET | ORAL | 5 refills | Status: DC

## 2019-09-13 MED ORDER — LISINOPRIL-HYDROCHLOROTHIAZIDE 20-12.5 MG PO TABS: 1.0000 | ORAL_TABLET | Freq: Every day | ORAL | 3 refills | Status: DC

## 2019-09-13 MED ORDER — AMLODIPINE BESYLATE 10 MG PO TABS: ORAL_TABLET | ORAL | 3 refills | Status: DC

## 2019-09-13 NOTE — Progress Notes (Signed)
Kristin Cook is a 86 y.o. female who presents for annual wellness visit and follow-up on chronic medical conditions.  She has no particular concerns or complaints.  She does wear hearing aids and finds them to be useful.  She has very little difficulty with arthritis.  She is now walking with a cane because it makes her more stable.  Her boyfriend is apparently taking her out for walks on a regular basis.  She does admit to recently noting increased blood sugar readings.  She does admit to some dietary indiscretion.  Mentally she feels fine although she does say she is depressed but further discussion with her indicates she overall is in a very good spot psychologically.  She continues on Actos plus and Tradjenta.  Also continues on amlodipine and lisinopril/HCTZ.  Crestor is causing no difficulty.  She has no urinary symptoms at the present time.  She does follow-up regularly with ophthalmology.  Immunizations and Health Maintenance Immunization History  Administered Date(s) Administered  . DTaP 03/14/2002  . Influenza Split 01/12/2013, 12/11/2014  . Influenza Whole 01/21/2007, 11/17/2007, 11/05/2010  . Influenza, High Dose Seasonal PF 11/11/2013, 12/10/2015, 12/03/2016, 02/01/2018, 12/24/2018  . Influenza-Unspecified 12/03/2016  . PFIZER SARS-COV-2 Vaccination 04/22/2019, 05/13/2019  . Pneumococcal Conjugate-13 07/01/2016  . Pneumococcal Polysaccharide-23 09/07/2006, 05/10/2013  . Tdap 08/05/2012  . Zoster 03/23/2007  . Zoster Recombinat (Shingrix) 07/16/2016, 06/21/2018   Health Maintenance Due  Topic Date Due  . DEXA SCAN  Never done  . FOOT EXAM  05/17/2019    Last Pap smear: aged out  Last mammogram: 05-08-08 Last colonoscopy: 07-27-02 Last DEXA: N/A  Dentist: N/A Ophtho: Once a year Exercise: N/A  Other doctors caring for patient include: Dr. Katy Fitch eye   Advanced directives: Does Patient Have a Medical Advance Directive?: Yes Type of Advance Directive: Living  will  Depression screen:  See questionnaire below.  Depression screen [facility] 2/9 09/13/2019 05/17/2018 10/01/2017 07/01/2016 10/22/2015  Decreased Interest 0 0 0 0 0  Down, Depressed, Hopeless 3 0 0 0 0  PHQ - 2 Score 3 0 0 0 0  Altered sleeping 0 - - - -  Tired, decreased energy 0 - - - -  Change in appetite 0 - - - -  Feeling bad or failure about yourself  0 - - - -  Trouble concentrating 0 - - - -  Moving slowly or fidgety/restless 0 - - - -  Suicidal thoughts 0 - - - -  PHQ-9 Score 3 - - - -  Difficult doing work/chores Not difficult at all - - - -    Fall Risk Screen: see questionnaire below. Fall Risk  09/13/2019 05/17/2018 10/01/2017 09/01/2017 07/01/2016  Falls in the past year? 0 0 No No No    ADL screen:  See questionnaire below Functional Status Survey: Is the patient deaf or have difficulty hearing?: Yes Does the patient have difficulty seeing, even when wearing glasses/contacts?: No Does the patient have difficulty concentrating, remembering, or making decisions?: Yes (remembering) Does the patient have difficulty walking or climbing stairs?: Yes Does the patient have difficulty dressing or bathing?: No Does the patient have difficulty doing errands alone such as visiting a doctor's office or shopping?: Yes (has support)   Review of Systems Constitutional: -, -unexpected weight change, -anorexia, -fatigue Allergy: -sneezing, -itching, -congestion Dermatology: denies changing moles, rash, lumps ENT: -runny nose, -ear pain, -sore throat,  Cardiology:  -chest pain, -palpitations, -orthopnea, Respiratory: -cough, -shortness of breath, -dyspnea on exertion, -wheezing,  Gastroenterology: -abdominal pain, -nausea, -vomiting, -diarrhea, -constipation, -dysphagia Hematology: -bleeding or bruising problems Musculoskeletal: -arthralgias, -myalgias, -joint swelling, -back pain, - Ophthalmology: -vision changes,  Urology: -dysuria, -difficulty urinating,  -urinary frequency, -urgency,  incontinence Neurology: -, -numbness, , -memory loss, -falls, -dizziness    PHYSICAL EXAM: General Appearance: Alert, cooperative, no distress, appears stated age Head: Normocephalic, without obvious abnormality, atraumatic Eyes: PERRL, conjunctiva/corneas clear, EOM's intact,  Ears: Normal TM's and external ear canals Nose: Nares normal, mucosa normal, no drainage or sinus tenderness Throat: Lips, mucosa, and tongue normal; teeth and gums normal Neck: Supple, no lymphadenopathy;  thyroid:  no enlargement/tenderness/nodules; no carotid bruit or JVD Lungs: Clear to auscultation bilaterally without wheezes, rales or ronchi; respirations unlabored Heart: Regular rate and rhythm, S1 and S2 normal, no murmur, rubor gallop Abdomen: Soft, non-tender, nondistended, normoactive bowel sounds,  no masses, no hepatosplenomegaly Extremities: No clubbing, cyanosis or edema Pulses: 2+ and symmetric all extremities Skin:  Skin color, texture, turgor normal, no rashes or lesions multiple nails are thickened on both feet. Lymph nodes: Cervical, supraclavicular, and axillary nodes normal Neurologic:  CNII-XII intact, normal strength, sensation and gait; reflexes 2+ and symmetric throughout Psych: Normal mood, affect, hygiene and grooming. Hemoglobin A1c is 8.1 ASSESSMENT/PLAN: Routine general medical examination at a health care facility  Severe nonproliferative diabetic retinopathy of left eye with macular edema associated with type 2 diabetes mellitus (HCC)  Presbycusis of both ears  Mild cognitive impairment with memory loss  Hypertension associated with diabetes ([place]) - Plan: amLODipine (NORVASC) 10 MG tablet, lisinopril-hydrochlorothiazide (ZESTORETIC) 20-12.5 MG tablet  Hyperlipidemia associated with type 2 diabetes mellitus ([place]) - Plan: rosuvastatin (CRESTOR) 20 MG tablet  Controlled type 2 diabetes mellitus with complication, without long-term current use of insulin ([place]) - Plan: POCT  glycosylated hemoglobin (Hb A1C), pioglitazone-metformin (ACTOPLUS MET) 15-850 MG tablet, linagliptin (TRADJENTA) 5 MG TABS tablet  Arthritis  Screening for osteoporosis - Plan: [facility] DEXA SCAN  Estrogen deficiency - Plan: [facility] DEXA SCAN   Discussed the need for her to retain more attention to her eating habits and possibly increase her physical activity. Encouraged her to stay as physically active as possible and use her cane..  Encouraged her to also use her hearing aids Discussed  at least 30 minutes of aerobic activity at least 5 days/week and weight-bearing exercise 2x/week;   Immunization recommendations discussed.   I think psychologically she is doing fine at the present time Medicare Attestation I have personally reviewed: The patient's medical and social history Their use of alcohol, tobacco or illicit drugs Their current medications and supplements The patient's functional ability including ADLs,fall risks, home safety risks, cognitive, and hearing and visual impairment Diet and physical activities Evidence for depression or mood disorders  The patient's weight, height, and BMI have been recorded in the chart.  I have made referrals, counseling, and provided education to the patient based on review of the above and I have provided the patient with a written personalized care plan for preventive services.     Jill Alexanders, MD   09/13/2019

## 2019-11-21 ENCOUNTER — Other Ambulatory Visit: Payer: Self-pay | Admitting: Family Medicine

## 2019-11-21 DIAGNOSIS — E118 Type 2 diabetes mellitus with unspecified complications: Secondary | ICD-10-CM

## 2019-12-06 DIAGNOSIS — H35372 Puckering of macula, left eye: Secondary | ICD-10-CM | POA: Diagnosis not present

## 2019-12-06 DIAGNOSIS — H02831 Dermatochalasis of right upper eyelid: Secondary | ICD-10-CM | POA: Diagnosis not present

## 2019-12-06 DIAGNOSIS — E119 Type 2 diabetes mellitus without complications: Secondary | ICD-10-CM | POA: Diagnosis not present

## 2019-12-06 DIAGNOSIS — H02834 Dermatochalasis of left upper eyelid: Secondary | ICD-10-CM | POA: Diagnosis not present

## 2019-12-06 DIAGNOSIS — H353124 Nonexudative age-related macular degeneration, left eye, advanced atrophic with subfoveal involvement: Secondary | ICD-10-CM | POA: Diagnosis not present

## 2019-12-06 LAB — HM DIABETES EYE EXAM

## 2019-12-21 ENCOUNTER — Encounter: Payer: Self-pay | Admitting: Family Medicine

## 2020-01-17 NOTE — Progress Notes (Signed)
Subjective:    Patient ID: Kristin Cook, female    DOB: 1931/08/21, 86 y.o.   MRN: [id number]  Kristin Cook is a 86 y.o. female who presents for follow-up of Type 2 diabetes mellitus.  Home blood sugar records: meter record, fasting and post meal, avg. 160 Current symptoms/problems include none at this time. Daily foot checks: yes   Any foot concerns: swelling and toe nails Exercise: walking QD for 30 min Diet: fair She is hard of hearing, does have hearing aids but is not wearing them.  When asked about taking her medications, she has no idea of which pills she is taking other than what she states is written on the bottles.  She notes that her blood sugars have been running high but when not made any efforts to make any changes in them.  She does complain of some bilateral foot swelling that does get worse as the day goes on.  She does not smoke or drink. The following portions of the patient's history were reviewed and updated as appropriate: allergies, current medications, past medical history, past social history and problem list.  ROS as in subjective above.     Objective:    Physical Exam Alert and in no distress.  Foot exam shows normal sensation with some slight edema.  Pulses were difficult to feel.  Capillary refill was normal.  Previous ABI of 2014 was normal Lab Review Diabetic Labs Latest Ref Rng & Units 09/13/2019 05/26/2019 01/24/2019 09/21/2018 05/17/2018  HbA1c 4.0 - 5.6 % 8.1(A) 7.9(A) 6.8(A) 7.2(A) 7.8(A)  Microalbumin mg/L - - 48.5 - 51.6  Micro/Creat Ratio - - - 98.0 - 76.6  Chol 100 - 199 mg/dL - - 681 - -  HDL >15 mg/dL - - 69 - -  Calc LDL 0 - 99 mg/dL - - 49 - -  Triglycerides 0 - 149 mg/dL - - 61 - -  Creatinine 0.57 - 1.00 mg/dL - - 7.26 - -   BP/Weight 09/13/2019 05/26/2019 01/24/2019 01/19/2019 09/21/2018  Systolic BP 130 126 136 - 126  Diastolic BP 74 76 76 - 76  Wt. (Lbs) 139.8 142.8 147 146 144.8  BMI 26.41 26.12 26.89 25.86 25.65   Foot/eye exam  completion dates Latest Ref Rng & Units 12/06/2019 12/01/2018  Eye Exam No Retinopathy No Retinopathy No Retinopathy  Foot Form Completion - - -  Hemoglobin A1c is 9.0  Ikia  reports that she has quit smoking. She has never used smokeless tobacco. She reports that she does not drink alcohol and does not use drugs.     Assessment & Plan:    Presbycusis of both ears  Mild cognitive impairment with memory loss  Hypertension associated with diabetes (HCC) - Plan: CBC with Differential/Platelet, Comprehensive metabolic panel  Hyperlipidemia associated with type 2 diabetes mellitus (HCC) - Plan: Lipid panel  Need for influenza vaccination - Plan: Flu Vaccine QUAD High Dose(Fluad)  Immunization, viral disease - Plan: Pfizer SARS-COV-2 Vaccine  Uncontrolled type 2 diabetes mellitus with hyperglycemia (HCC) - Plan: POCT glycosylated hemoglobin (Hb A1C), CBC with Differential/Platelet, Comprehensive metabolic panel, Lipid panel  Dependent edema  Onychomycosis of multiple toenails with type 2 diabetes mellitus (HCC)   1. Rx changes: none 2. Education: Reviewed 'ABCs' of diabetes management (respective goals in parentheses):  A1C (<7), blood pressure (<130/80), and cholesterol (LDL <100). 3. Compliance at present is estimated to be poor. Efforts to improve compliance (if necessary) will be directed at increased exercise. 4. Follow up: 1  months Her cognitive function is always questionable.  She is not wearing her hearing aid she makes it difficult to communicate.  I told her that I wanted her return here in 1 month, wear her hearing aid and bring in all of her pills so we can verify what she is truly taking.

## 2020-01-18 ENCOUNTER — Other Ambulatory Visit: Payer: Self-pay

## 2020-01-18 ENCOUNTER — Encounter: Payer: Self-pay | Admitting: Family Medicine

## 2020-01-18 ENCOUNTER — Ambulatory Visit (INDEPENDENT_AMBULATORY_CARE_PROVIDER_SITE_OTHER): Payer: Medicare Other | Admitting: Family Medicine

## 2020-01-18 VITALS — BP 140/90 | HR 85 | Temp 96.5°F | Wt 138.6 lb

## 2020-01-18 DIAGNOSIS — E1159 Type 2 diabetes mellitus with other circulatory complications: Secondary | ICD-10-CM

## 2020-01-18 DIAGNOSIS — B351 Tinea unguium: Secondary | ICD-10-CM

## 2020-01-18 DIAGNOSIS — I152 Hypertension secondary to endocrine disorders: Secondary | ICD-10-CM

## 2020-01-18 DIAGNOSIS — E1165 Type 2 diabetes mellitus with hyperglycemia: Secondary | ICD-10-CM

## 2020-01-18 DIAGNOSIS — H9113 Presbycusis, bilateral: Secondary | ICD-10-CM

## 2020-01-18 DIAGNOSIS — E1169 Type 2 diabetes mellitus with other specified complication: Secondary | ICD-10-CM | POA: Diagnosis not present

## 2020-01-18 DIAGNOSIS — E785 Hyperlipidemia, unspecified: Secondary | ICD-10-CM

## 2020-01-18 DIAGNOSIS — G3184 Mild cognitive impairment, so stated: Secondary | ICD-10-CM | POA: Diagnosis not present

## 2020-01-18 DIAGNOSIS — R609 Edema, unspecified: Secondary | ICD-10-CM

## 2020-01-18 DIAGNOSIS — Z23 Encounter for immunization: Secondary | ICD-10-CM

## 2020-01-18 LAB — POCT GLYCOSYLATED HEMOGLOBIN (HGB A1C): Hemoglobin A1C: 9 % — AB (ref 4.0–5.6)

## 2020-01-18 NOTE — Patient Instructions (Signed)
Come back in 1 month and bring in all of your pills and wear your hearing aid

## 2020-01-19 ENCOUNTER — Ambulatory Visit: Payer: Medicare Other | Admitting: Family Medicine

## 2020-01-19 LAB — COMPREHENSIVE METABOLIC PANEL
ALT: 8 IU/L (ref 0–32)
AST: 10 IU/L (ref 0–40)
Albumin/Globulin Ratio: 1.6 (ref 1.2–2.2)
Albumin: 4.4 g/dL (ref 3.6–4.6)
Alkaline Phosphatase: 111 IU/L (ref 44–121)
BUN/Creatinine Ratio: 15 (ref 12–28)
BUN: 11 mg/dL (ref 8–27)
Bilirubin Total: 0.4 mg/dL (ref 0.0–1.2)
CO2: 25 mmol/L (ref 20–29)
Calcium: 9.6 mg/dL (ref 8.7–10.3)
Chloride: 96 mmol/L (ref 96–106)
Creatinine, Ser: 0.73 mg/dL (ref 0.57–1.00)
GFR calc Af Amer: 86 mL/min/{1.73_m2} (ref >59)
GFR calc non Af Amer: 75 mL/min/{1.73_m2} (ref >59)
Globulin, Total: 2.8 g/dL (ref 1.5–4.5)
Glucose: 266 mg/dL — ABNORMAL HIGH (ref 65–99)
Potassium: 4.4 mmol/L (ref 3.5–5.2)
Sodium: 137 mmol/L (ref 134–144)
Total Protein: 7.2 g/dL (ref 6.0–8.5)

## 2020-01-19 LAB — CBC WITH DIFFERENTIAL/PLATELET
Basophils Absolute: 0.1 10*3/uL (ref 0.0–0.2)
Basos: 1 %
EOS (ABSOLUTE): 0.1 10*3/uL (ref 0.0–0.4)
Eos: 2 %
Hematocrit: 44.4 % (ref 34.0–46.6)
Hemoglobin: 14.9 g/dL (ref 11.1–15.9)
Immature Grans (Abs): 0 10*3/uL (ref 0.0–0.1)
Immature Granulocytes: 1 %
Lymphocytes Absolute: 0.9 10*3/uL (ref 0.7–3.1)
Lymphs: 14 %
MCH: 29.4 pg (ref 26.6–33.0)
MCHC: 33.6 g/dL (ref 31.5–35.7)
MCV: 88 fL (ref 79–97)
Monocytes Absolute: 0.6 10*3/uL (ref 0.1–0.9)
Monocytes: 9 %
Neutrophils Absolute: 4.8 10*3/uL (ref 1.4–7.0)
Neutrophils: 73 %
Platelets: 263 10*3/uL (ref 150–450)
RBC: 5.07 x10E6/uL (ref 3.77–5.28)
RDW: 11.8 % (ref 11.7–15.4)
WBC: 6.5 10*3/uL (ref 3.4–10.8)

## 2020-01-19 LAB — LIPID PANEL
Chol/HDL Ratio: 2.8 ratio (ref 0.0–4.4)
Cholesterol, Total: 242 mg/dL — ABNORMAL HIGH (ref 100–199)
HDL: 86 mg/dL (ref >39)
LDL Chol Calc (NIH): 144 mg/dL — ABNORMAL HIGH (ref 0–99)
Triglycerides: 74 mg/dL (ref 0–149)
VLDL Cholesterol Cal: 12 mg/dL (ref 5–40)

## 2020-02-12 ENCOUNTER — Emergency Department (HOSPITAL_COMMUNITY): Payer: Medicare Other

## 2020-02-12 ENCOUNTER — Inpatient Hospital Stay (HOSPITAL_COMMUNITY)
Admission: EM | Admit: 2020-02-12 | Discharge: 2020-02-18 | DRG: 515 | Disposition: A | Discharge status: Skilled Nursing Facility | Payer: Medicare Other | Attending: Internal Medicine | Admitting: Internal Medicine

## 2020-02-12 ENCOUNTER — Encounter (HOSPITAL_COMMUNITY): Payer: Self-pay | Admitting: Internal Medicine

## 2020-02-12 DIAGNOSIS — M4856XA Collapsed vertebra, not elsewhere classified, lumbar region, initial encounter for fracture: Secondary | ICD-10-CM | POA: Diagnosis not present

## 2020-02-12 DIAGNOSIS — M4854XA Collapsed vertebra, not elsewhere classified, thoracic region, initial encounter for fracture: Principal | ICD-10-CM | POA: Diagnosis present

## 2020-02-12 DIAGNOSIS — R2689 Other abnormalities of gait and mobility: Secondary | ICD-10-CM | POA: Diagnosis not present

## 2020-02-12 DIAGNOSIS — M4856XS Collapsed vertebra, not elsewhere classified, lumbar region, sequela of fracture: Secondary | ICD-10-CM | POA: Diagnosis not present

## 2020-02-12 DIAGNOSIS — E1159 Type 2 diabetes mellitus with other circulatory complications: Secondary | ICD-10-CM | POA: Diagnosis not present

## 2020-02-12 DIAGNOSIS — E118 Type 2 diabetes mellitus with unspecified complications: Secondary | ICD-10-CM

## 2020-02-12 DIAGNOSIS — U071 COVID-19: Secondary | ICD-10-CM | POA: Diagnosis not present

## 2020-02-12 DIAGNOSIS — M6281 Muscle weakness (generalized): Secondary | ICD-10-CM | POA: Diagnosis not present

## 2020-02-12 DIAGNOSIS — F0390 Unspecified dementia without behavioral disturbance: Secondary | ICD-10-CM | POA: Diagnosis present

## 2020-02-12 DIAGNOSIS — E1169 Type 2 diabetes mellitus with other specified complication: Secondary | ICD-10-CM | POA: Diagnosis present

## 2020-02-12 DIAGNOSIS — R0902 Hypoxemia: Secondary | ICD-10-CM | POA: Diagnosis not present

## 2020-02-12 DIAGNOSIS — I152 Hypertension secondary to endocrine disorders: Secondary | ICD-10-CM | POA: Diagnosis not present

## 2020-02-12 DIAGNOSIS — M549 Dorsalgia, unspecified: Secondary | ICD-10-CM | POA: Diagnosis not present

## 2020-02-12 DIAGNOSIS — Z7401 Bed confinement status: Secondary | ICD-10-CM | POA: Diagnosis not present

## 2020-02-12 DIAGNOSIS — R6889 Other general symptoms and signs: Secondary | ICD-10-CM | POA: Diagnosis not present

## 2020-02-12 DIAGNOSIS — E1165 Type 2 diabetes mellitus with hyperglycemia: Secondary | ICD-10-CM | POA: Diagnosis present

## 2020-02-12 DIAGNOSIS — T380X5A Adverse effect of glucocorticoids and synthetic analogues, initial encounter: Secondary | ICD-10-CM | POA: Diagnosis not present

## 2020-02-12 DIAGNOSIS — I361 Nonrheumatic tricuspid (valve) insufficiency: Secondary | ICD-10-CM | POA: Diagnosis not present

## 2020-02-12 DIAGNOSIS — Z743 Need for continuous supervision: Secondary | ICD-10-CM | POA: Diagnosis not present

## 2020-02-12 DIAGNOSIS — I517 Cardiomegaly: Secondary | ICD-10-CM | POA: Diagnosis not present

## 2020-02-12 DIAGNOSIS — G3184 Mild cognitive impairment, so stated: Secondary | ICD-10-CM | POA: Diagnosis present

## 2020-02-12 DIAGNOSIS — Z66 Do not resuscitate: Secondary | ICD-10-CM | POA: Diagnosis present

## 2020-02-12 DIAGNOSIS — R1312 Dysphagia, oropharyngeal phase: Secondary | ICD-10-CM | POA: Diagnosis not present

## 2020-02-12 DIAGNOSIS — J9601 Acute respiratory failure with hypoxia: Secondary | ICD-10-CM | POA: Diagnosis not present

## 2020-02-12 DIAGNOSIS — I10 Essential (primary) hypertension: Secondary | ICD-10-CM | POA: Diagnosis present

## 2020-02-12 DIAGNOSIS — G9341 Metabolic encephalopathy: Secondary | ICD-10-CM | POA: Diagnosis present

## 2020-02-12 DIAGNOSIS — E876 Hypokalemia: Secondary | ICD-10-CM | POA: Diagnosis present

## 2020-02-12 DIAGNOSIS — Z79899 Other long term (current) drug therapy: Secondary | ICD-10-CM | POA: Diagnosis not present

## 2020-02-12 DIAGNOSIS — J849 Interstitial pulmonary disease, unspecified: Secondary | ICD-10-CM | POA: Diagnosis present

## 2020-02-12 DIAGNOSIS — K760 Fatty (change of) liver, not elsewhere classified: Secondary | ICD-10-CM | POA: Diagnosis not present

## 2020-02-12 DIAGNOSIS — R0602 Shortness of breath: Secondary | ICD-10-CM | POA: Diagnosis not present

## 2020-02-12 DIAGNOSIS — R2681 Unsteadiness on feet: Secondary | ICD-10-CM | POA: Diagnosis not present

## 2020-02-12 DIAGNOSIS — I2721 Secondary pulmonary arterial hypertension: Secondary | ICD-10-CM | POA: Diagnosis present

## 2020-02-12 DIAGNOSIS — R52 Pain, unspecified: Secondary | ICD-10-CM | POA: Diagnosis not present

## 2020-02-12 DIAGNOSIS — M2550 Pain in unspecified joint: Secondary | ICD-10-CM | POA: Diagnosis not present

## 2020-02-12 DIAGNOSIS — S22080A Wedge compression fracture of T11-T12 vertebra, initial encounter for closed fracture: Secondary | ICD-10-CM

## 2020-02-12 DIAGNOSIS — F17210 Nicotine dependence, cigarettes, uncomplicated: Secondary | ICD-10-CM | POA: Diagnosis present

## 2020-02-12 DIAGNOSIS — N3281 Overactive bladder: Secondary | ICD-10-CM | POA: Diagnosis present

## 2020-02-12 DIAGNOSIS — Z7984 Long term (current) use of oral hypoglycemic drugs: Secondary | ICD-10-CM

## 2020-02-12 DIAGNOSIS — M545 Low back pain, unspecified: Secondary | ICD-10-CM | POA: Diagnosis not present

## 2020-02-12 DIAGNOSIS — J1282 Pneumonia due to coronavirus disease 2019: Secondary | ICD-10-CM | POA: Diagnosis present

## 2020-02-12 DIAGNOSIS — E785 Hyperlipidemia, unspecified: Secondary | ICD-10-CM | POA: Diagnosis not present

## 2020-02-12 DIAGNOSIS — I342 Nonrheumatic mitral (valve) stenosis: Secondary | ICD-10-CM | POA: Diagnosis not present

## 2020-02-12 DIAGNOSIS — R7989 Other specified abnormal findings of blood chemistry: Secondary | ICD-10-CM | POA: Diagnosis not present

## 2020-02-12 LAB — URINALYSIS, ROUTINE W REFLEX MICROSCOPIC
Bacteria, UA: NONE SEEN
Bilirubin Urine: NEGATIVE
Glucose, UA: >=500 mg/dL — AB
Hgb urine dipstick: NEGATIVE
Ketones, ur: 5 mg/dL — AB
Leukocytes,Ua: NEGATIVE
Nitrite: NEGATIVE
Protein, ur: 30 mg/dL — AB
Specific Gravity, Urine: 1.003 — ABNORMAL LOW (ref 1.005–1.030)
pH: 6 (ref 5.0–8.0)

## 2020-02-12 LAB — CBC WITH DIFFERENTIAL/PLATELET
Abs Immature Granulocytes: 0.03 10*3/uL (ref 0.00–0.07)
Basophils Absolute: 0 10*3/uL (ref 0.0–0.1)
Basophils Relative: 1 %
Eosinophils Absolute: 0.1 10*3/uL (ref 0.0–0.5)
Eosinophils Relative: 1 %
HCT: 41.6 % (ref 36.0–46.0)
Hemoglobin: 13.7 g/dL (ref 12.0–15.0)
Immature Granulocytes: 1 %
Lymphocytes Relative: 9 %
Lymphs Abs: 0.6 10*3/uL — ABNORMAL LOW (ref 0.7–4.0)
MCH: 29.6 pg (ref 26.0–34.0)
MCHC: 32.9 g/dL (ref 30.0–36.0)
MCV: 89.8 fL (ref 80.0–100.0)
Monocytes Absolute: 0.6 10*3/uL (ref 0.1–1.0)
Monocytes Relative: 9 %
Neutro Abs: 5 10*3/uL (ref 1.7–7.7)
Neutrophils Relative %: 79 %
Platelets: 221 10*3/uL (ref 150–400)
RBC: 4.63 MIL/uL (ref 3.87–5.11)
RDW: 12.9 % (ref 11.5–15.5)
WBC: 6.3 10*3/uL (ref 4.0–10.5)
nRBC: 0 % (ref 0.0–0.2)

## 2020-02-12 LAB — BRAIN NATRIURETIC PEPTIDE: B Natriuretic Peptide: 234.2 pg/mL — ABNORMAL HIGH (ref 0.0–100.0)

## 2020-02-12 LAB — COMPREHENSIVE METABOLIC PANEL
ALT: 14 U/L (ref 0–44)
AST: 13 U/L — ABNORMAL LOW (ref 15–41)
Albumin: 3.4 g/dL — ABNORMAL LOW (ref 3.5–5.0)
Alkaline Phosphatase: 82 U/L (ref 38–126)
Anion gap: 11 (ref 5–15)
BUN: 12 mg/dL (ref 8–23)
CO2: 24 mmol/L (ref 22–32)
Calcium: 8.8 mg/dL — ABNORMAL LOW (ref 8.9–10.3)
Chloride: 99 mmol/L (ref 98–111)
Creatinine, Ser: 0.8 mg/dL (ref 0.44–1.00)
GFR, Estimated: >60 mL/min (ref >60)
Glucose, Bld: 246 mg/dL — ABNORMAL HIGH (ref 70–99)
Potassium: 4.1 mmol/L (ref 3.5–5.1)
Sodium: 134 mmol/L — ABNORMAL LOW (ref 135–145)
Total Bilirubin: 1 mg/dL (ref 0.3–1.2)
Total Protein: 6.4 g/dL — ABNORMAL LOW (ref 6.5–8.1)

## 2020-02-12 LAB — TROPONIN I (HIGH SENSITIVITY)
Troponin I (High Sensitivity): 15 ng/L (ref <18)
Troponin I (High Sensitivity): 18 ng/L — ABNORMAL HIGH (ref <18)

## 2020-02-12 LAB — RESP PANEL BY RT-PCR (FLU A&B, COVID) ARPGX2
Influenza A by PCR: NEGATIVE
Influenza B by PCR: NEGATIVE
SARS Coronavirus 2 by RT PCR: POSITIVE — AB

## 2020-02-12 LAB — CBG MONITORING, ED: Glucose-Capillary: 247 mg/dL — ABNORMAL HIGH (ref 70–99)

## 2020-02-12 MED ORDER — LIDOCAINE 5 % EX PTCH
1.0000 | MEDICATED_PATCH | CUTANEOUS | Status: DC
  Administered 2020-02-12 – 2020-02-17 (×6): 1 via TRANSDERMAL
  Filled 2020-02-12 (×6): qty 1

## 2020-02-12 MED ORDER — ASCORBIC ACID 500 MG PO TABS
500.0000 mg | ORAL_TABLET | Freq: Every day | ORAL | Status: DC
  Administered 2020-02-13 – 2020-02-18 (×6): 500 mg via ORAL
  Filled 2020-02-12 (×6): qty 1

## 2020-02-12 MED ORDER — POLYETHYLENE GLYCOL 3350 17 G PO PACK: 17.0000 g | PACK | Freq: Every day | ORAL | Status: DC | PRN

## 2020-02-12 MED ORDER — HYDRALAZINE HCL 20 MG/ML IJ SOLN
5.0000 mg | INTRAMUSCULAR | Status: DC | PRN
  Administered 2020-02-17: 5 mg via INTRAVENOUS
  Filled 2020-02-12: qty 1

## 2020-02-12 MED ORDER — ZINC SULFATE 220 (50 ZN) MG PO CAPS
220.0000 mg | ORAL_CAPSULE | Freq: Every day | ORAL | Status: DC
  Administered 2020-02-13 – 2020-02-18 (×6): 220 mg via ORAL
  Filled 2020-02-12 (×6): qty 1

## 2020-02-12 MED ORDER — FENTANYL CITRATE (PF) 100 MCG/2ML IJ SOLN
25.0000 ug | Freq: Once | INTRAMUSCULAR | Status: AC
  Administered 2020-02-12: 25 ug via INTRAVENOUS
  Filled 2020-02-12: qty 2

## 2020-02-12 MED ORDER — DEXAMETHASONE SODIUM PHOSPHATE 10 MG/ML IJ SOLN
6.0000 mg | Freq: Every day | INTRAMUSCULAR | Status: DC
  Administered 2020-02-13 – 2020-02-16 (×5): 6 mg via INTRAVENOUS
  Filled 2020-02-12 (×6): qty 1

## 2020-02-12 MED ORDER — FUROSEMIDE 10 MG/ML IJ SOLN
20.0000 mg | Freq: Once | INTRAMUSCULAR | Status: AC
  Administered 2020-02-12: 20 mg via INTRAVENOUS
  Filled 2020-02-12: qty 2

## 2020-02-12 MED ORDER — IOHEXOL 300 MG/ML  SOLN
100.0000 mL | Freq: Once | INTRAMUSCULAR | Status: AC | PRN
  Administered 2020-02-12: 100 mL via INTRAVENOUS

## 2020-02-12 MED ORDER — GUAIFENESIN-DM 100-10 MG/5ML PO SYRP: 10.0000 mL | ORAL_SOLUTION | ORAL | Status: DC | PRN

## 2020-02-12 MED ORDER — HYDROCHLOROTHIAZIDE 12.5 MG PO CAPS
12.5000 mg | ORAL_CAPSULE | Freq: Every day | ORAL | Status: DC
  Administered 2020-02-13 – 2020-02-18 (×6): 12.5 mg via ORAL
  Filled 2020-02-12 (×6): qty 1

## 2020-02-12 MED ORDER — ONDANSETRON HCL 4 MG/2ML IJ SOLN: 4.0000 mg | Freq: Four times a day (QID) | INTRAMUSCULAR | Status: DC | PRN

## 2020-02-12 MED ORDER — BISACODYL 5 MG PO TBEC
5.0000 mg | DELAYED_RELEASE_TABLET | Freq: Every day | ORAL | Status: DC | PRN
  Administered 2020-02-14: 5 mg via ORAL
  Filled 2020-02-12: qty 1

## 2020-02-12 MED ORDER — INSULIN ASPART 100 UNIT/ML ~~LOC~~ SOLN
0.0000 [IU] | Freq: Three times a day (TID) | SUBCUTANEOUS | Status: DC
  Administered 2020-02-13: 3 [IU] via SUBCUTANEOUS
  Administered 2020-02-13: 11 [IU] via SUBCUTANEOUS
  Administered 2020-02-13 – 2020-02-14 (×2): 3 [IU] via SUBCUTANEOUS
  Administered 2020-02-14: 4 [IU] via SUBCUTANEOUS
  Administered 2020-02-14: 2 [IU] via SUBCUTANEOUS
  Administered 2020-02-15 (×2): 5 [IU] via SUBCUTANEOUS
  Administered 2020-02-15: 8 [IU] via SUBCUTANEOUS
  Administered 2020-02-16: 5 [IU] via SUBCUTANEOUS
  Administered 2020-02-16 (×2): 3 [IU] via SUBCUTANEOUS
  Administered 2020-02-17: 15 [IU] via SUBCUTANEOUS
  Administered 2020-02-17: 5 [IU] via SUBCUTANEOUS
  Administered 2020-02-17 – 2020-02-18 (×2): 3 [IU] via SUBCUTANEOUS

## 2020-02-12 MED ORDER — LISINOPRIL-HYDROCHLOROTHIAZIDE 20-12.5 MG PO TABS: 1.0000 | ORAL_TABLET | Freq: Every day | ORAL | Status: DC

## 2020-02-12 MED ORDER — MORPHINE SULFATE (PF) 2 MG/ML IV SOLN: 2.0000 mg | INTRAVENOUS | Status: DC | PRN

## 2020-02-12 MED ORDER — DOCUSATE SODIUM 100 MG PO CAPS
100.0000 mg | ORAL_CAPSULE | Freq: Two times a day (BID) | ORAL | Status: DC
  Administered 2020-02-12 – 2020-02-18 (×10): 100 mg via ORAL
  Filled 2020-02-12 (×10): qty 1

## 2020-02-12 MED ORDER — ENOXAPARIN SODIUM 40 MG/0.4ML ~~LOC~~ SOLN
40.0000 mg | SUBCUTANEOUS | Status: DC
  Administered 2020-02-12: 40 mg via SUBCUTANEOUS
  Filled 2020-02-12: qty 0.4

## 2020-02-12 MED ORDER — ACETAMINOPHEN 500 MG PO TABS
1000.0000 mg | ORAL_TABLET | Freq: Once | ORAL | Status: AC
  Administered 2020-02-12: 1000 mg via ORAL
  Filled 2020-02-12: qty 2

## 2020-02-12 MED ORDER — SODIUM CHLORIDE 0.9 % IV SOLN
100.0000 mg | Freq: Every day | INTRAVENOUS | Status: AC
  Administered 2020-02-13 – 2020-02-16 (×4): 100 mg via INTRAVENOUS
  Filled 2020-02-12 (×3): qty 20

## 2020-02-12 MED ORDER — ALBUTEROL SULFATE HFA 108 (90 BASE) MCG/ACT IN AERS
2.0000 | INHALATION_SPRAY | Freq: Four times a day (QID) | RESPIRATORY_TRACT | Status: DC
  Administered 2020-02-13 – 2020-02-18 (×18): 2 via RESPIRATORY_TRACT
  Filled 2020-02-12 (×3): qty 6.7

## 2020-02-12 MED ORDER — OXYCODONE HCL 5 MG PO TABS
5.0000 mg | ORAL_TABLET | ORAL | Status: DC | PRN
  Administered 2020-02-15 (×2): 5 mg via ORAL
  Filled 2020-02-12 (×2): qty 1

## 2020-02-12 MED ORDER — INSULIN ASPART 100 UNIT/ML ~~LOC~~ SOLN
0.0000 [IU] | Freq: Every day | SUBCUTANEOUS | Status: DC
  Administered 2020-02-12: 2 [IU] via SUBCUTANEOUS
  Administered 2020-02-14: 5 [IU] via SUBCUTANEOUS
  Administered 2020-02-15: 2 [IU] via SUBCUTANEOUS
  Administered 2020-02-16: 3 [IU] via SUBCUTANEOUS

## 2020-02-12 MED ORDER — ROSUVASTATIN CALCIUM 20 MG PO TABS
20.0000 mg | ORAL_TABLET | Freq: Every day | ORAL | Status: DC
  Administered 2020-02-13 – 2020-02-18 (×6): 20 mg via ORAL
  Filled 2020-02-12 (×3): qty 1
  Filled 2020-02-12: qty 4
  Filled 2020-02-12 (×2): qty 1

## 2020-02-12 MED ORDER — AMLODIPINE BESYLATE 10 MG PO TABS
10.0000 mg | ORAL_TABLET | Freq: Every day | ORAL | Status: DC
  Administered 2020-02-12 – 2020-02-18 (×7): 10 mg via ORAL
  Filled 2020-02-12 (×2): qty 1
  Filled 2020-02-12: qty 2
  Filled 2020-02-12: qty 1
  Filled 2020-02-12 (×2): qty 2
  Filled 2020-02-12: qty 1

## 2020-02-12 MED ORDER — SODIUM CHLORIDE 0.9 % IV SOLN
200.0000 mg | Freq: Once | INTRAVENOUS | Status: AC
  Administered 2020-02-13: 200 mg via INTRAVENOUS
  Filled 2020-02-12: qty 40

## 2020-02-12 MED ORDER — LISINOPRIL 20 MG PO TABS
20.0000 mg | ORAL_TABLET | Freq: Every day | ORAL | Status: DC
  Administered 2020-02-13 – 2020-02-18 (×6): 20 mg via ORAL
  Filled 2020-02-12 (×6): qty 1

## 2020-02-12 MED ORDER — ONDANSETRON HCL 4 MG PO TABS: 4.0000 mg | ORAL_TABLET | Freq: Four times a day (QID) | ORAL | Status: DC | PRN

## 2020-02-12 MED ORDER — NICOTINE 14 MG/24HR TD PT24
14.0000 mg | MEDICATED_PATCH | Freq: Every day | TRANSDERMAL | Status: DC
  Administered 2020-02-18: 14 mg via TRANSDERMAL
  Filled 2020-02-12 (×4): qty 1

## 2020-02-12 MED ORDER — ACETAMINOPHEN 325 MG PO TABS: 650.0000 mg | ORAL_TABLET | Freq: Four times a day (QID) | ORAL | Status: DC | PRN

## 2020-02-12 MED ORDER — SODIUM CHLORIDE 0.9% FLUSH
3.0000 mL | Freq: Two times a day (BID) | INTRAVENOUS | Status: DC
  Administered 2020-02-13 – 2020-02-18 (×10): 3 mL via INTRAVENOUS

## 2020-02-12 MED ORDER — ACETAMINOPHEN 650 MG RE SUPP: 650.0000 mg | Freq: Four times a day (QID) | RECTAL | Status: DC | PRN

## 2020-02-12 NOTE — ED Notes (Signed)
Pt up to bedside commode with minimal assist.  Increased pain in back with movement

## 2020-02-12 NOTE — H&P (Signed)
History and Physical    [name]:[id number] DOB: [birth year] DOA: 02/12/2020  PCP: [name], MD Consultants:  None Patient coming from:  Home - lives with daughter-in-law; Donald Prose: Daughter-in-law, [phone]  Chief Complaint: Back pain  HPI: Kristin Cook is a 86 y.o. female with medical history significant of HTN; HLD; DM; and dementia presenting with back pain.  She was having back pain for several days, but today she couldn't get out of the chair.  No h/o similar.  She denies falling.  She did complain that her feet were numb.  She denies SOB.  No CP.  Some mild LE edema recently.   ED Course:  Back pain today, unable to get up, no apparent injury.  CT with T12 compression fracture.  Dr. Reatha Armour consulted, recommends TLSO, PT/OT (not planning to see since no neuro deficits).  Also with SOB, hs a smoker, sats 80-90s, on 2L.  CXR with ?edema.  CT abdomen with ?ILD.  Given low-dose Lasix.  This is secondary issue.  Review of Systems: As per HPI; otherwise review of systems reviewed and negative.   Ambulatory Status:  Ambulates with a cane  COVID Vaccine Status:   Complete - would like booster while hospitalized  Past Medical History:  Diagnosis Date  . Arthritis   . Cataract   . Cystocele   . Dementia ([place])    forgetfulness  . Diabetes mellitus   . Dyslipidemia   . History of colonic polyps   . Hx MRSA infection   . Hyperlipidemia   . Hypertension   . Mental disorder   . Neuropathic pain of lower extremity   . OAB (overactive bladder)   . Shortness of breath    with exertion    Past Surgical History:  Procedure Laterality Date  . ABDOMINAL HYSTERECTOMY    . EYE SURGERY Bilateral    Cataract  . PARS PLANA VITRECTOMY Left 03/15/2013   Procedure: PARS PLANA VITRECTOMY WITH 25 GAUGE LEFT EYE with Endolaser;  Surgeon: Hurman Horn, MD;  Location: [facility];  Service: Ophthalmology;  Laterality: Left;  Marland Kitchen VAGINAL PROLAPSE REPAIR  2007   Dr. Gaetano Net    Social  History   Socioeconomic History  . Marital status: Single    Spouse name: Not on file  . Number of children: Not on file  . Years of education: Not on file  . Highest education level: Not on file  Occupational History  . Occupation: retired  Tobacco Use  . Smoking status: Current Every Day Smoker    Packs/day: 0.50    Years: 60.00    Pack years: 30.00  . Smokeless tobacco: Never Used  Vaping Use  . Vaping Use: Never used  Substance and Sexual Activity  . Alcohol use: No  . Drug use: No  . Sexual activity: Not Currently  Other Topics Concern  . Not on file  Social History Narrative  . Not on file   Social Determinants of Health   Financial Resource Strain:   . Difficulty of Paying Living Expenses: Not on file  Food Insecurity:   . Worried About Charity fundraiser in the Last Year: Not on file  . Ran Out of Food in the Last Year: Not on file  Transportation Needs:   . Lack of Transportation (Medical): Not on file  . Lack of Transportation (Non-Medical): Not on file  Physical Activity:   . Days of Exercise per Week: Not on file  . Minutes of  Exercise per Session: Not on file  Stress:   . Feeling of Stress : Not on file  Social Connections:   . Frequency of Communication with Friends and Family: Not on file  . Frequency of Social Gatherings with Friends and Family: Not on file  . Attends Religious Services: Not on file  . Active Member of Clubs or Organizations: Not on file  . Attends Archivist Meetings: Not on file  . Marital Status: Not on file  Intimate Partner Violence:   . Fear of Current or Ex-Partner: Not on file  . Emotionally Abused: Not on file  . Physically Abused: Not on file  . Sexually Abused: Not on file    Allergies  Allergen Reactions  . Penicillins Swelling  . Sulfa Antibiotics Swelling    Weak   . Sulfamethoxazole Other (See Comments)    Feels like her legs are weighted down    Family History  Problem Relation Age of Onset   . Healthy Mother   . Diabetes Father   . Hypertension Neg Hx     Prior to Admission medications   Medication Sig Start Date End Date Taking? Authorizing Provider  amLODipine (NORVASC) 10 MG tablet TAKE 1 TABLET(10 MG) BY MOUTH DAILY 09/13/19   [name], MD  bismuth subsalicylate (PEPTO BISMOL) 262 MG/15ML suspension Take 30 mLs by mouth every 6 (six) hours as needed.    [provider]  diphenhydramine-acetaminophen (TYLENOL PM) 25-500 MG TABS Take 1 tablet by mouth at bedtime as needed.    [provider]  doxycycline (VIBRAMYCIN) 100 MG capsule Take 1 capsule (100 mg total) by mouth 2 (two) times daily. Patient not taking: Reported on 02/01/2018 10/04/17   Wieters, Hallie C, PA-C  linagliptin (TRADJENTA) 5 MG TABS tablet TAKE 1 TABLET(5 MG) BY MOUTH DAILY 09/13/19   [name], MD  lisinopril-hydrochlorothiazide (ZESTORETIC) 20-12.5 MG tablet Take 1 tablet by mouth daily. 09/13/19   [name], MD  loperamide (IMODIUM) 2 MG capsule Take 2 mg by mouth as needed for diarrhea or loose stools.    [provider]  mupirocin ointment (BACTROBAN) 2 % Apply 1 application topically 2 (two) times daily. Patient not taking: Reported on 09/13/2019 10/04/17   Joneen Caraway, [place] C, PA-C  [facility] ULTRA test strip USE AS DIRECTED 06/27/19   [name], MD  pioglitazone-metformin (ACTOPLUS MET) [phone] MG tablet TAKE 1 TABLET BY MOUTH TWICE DAILY WITH A MEAL 11/21/19   [name], MD  rosuvastatin (CRESTOR) 20 MG tablet TAKE 1 TABLET(20 MG) BY MOUTH DAILY 09/13/19   [name], MD    Physical Exam: Vitals:   02/12/20 1449 02/12/20 1500 02/12/20 1550 02/12/20 1645  BP:  (!) 174/73 (!) 184/101 (!) 208/105  Pulse:  78 93 80  Resp:  (!) 32 (!) 25 19  Temp:      TempSrc:      SpO2: 91% (!) 89% (!) 88% 97%     . General:  Appears calm and comfortable and is NAD . Eyes:  PERRL, EOMI, normal lids, iris . ENT:   Hard of hearing, grossly normal  lips & tongue,  mmm; artificial/absent dentition . Neck:  no LAD, masses or thyromegaly . Cardiovascular:  RRR, no r/g, 2/6 systolic murmur. No LE edema.  Marland Kitchen Respiratory:   CTA bilaterally with no wheezes/rales/rhonchi.  Mildly increased respiratory effort. . Abdomen:  soft, NT, ND, NABS . Skin:  no rash or induration seen on limited  exam . Musculoskeletal:  grossly normal tone BUE/BLE, good ROM, no bony abnormality . Psychiatric:  grossly normal mood and affect, speech fluent and appropriate, AOx2 . Neurologic:  CN 2-12 grossly intact, moves all extremities in coordinated fashion    Radiological Exams on Admission: Independently reviewed - see discussion in A/P where applicable  CT ABDOMEN PELVIS W CONTRAST  Result Date: 02/12/2020 CLINICAL DATA:  Four days of worsening lower back pain EXAM: CT ABDOMEN AND PELVIS WITH CONTRAST TECHNIQUE: Multidetector CT imaging of the abdomen and pelvis was performed using the standard protocol following bolus administration of intravenous contrast. CONTRAST:  149m OMNIPAQUE IOHEXOL 300 MG/ML  SOLN COMPARISON:  None. FINDINGS: Lower chest: Coronary artery atherosclerosis. Calcification of the mitral annulus. Cardiac size within normal limits. No pericardial effusion. Mosaic attenuation in the lung bases as well as subpleural reticular opacities may reflect a combination of interstitial disease and small airways disease or air trapping. No pleural effusion. Hepatobiliary: Diffuse hepatic hypoattenuation compatible with hepatic steatosis with a geographic hyperattenuating region adjacent the gallbladder fossa, possibly reflective of some sparing or transient hepatic attenuation difference. No concerning focal liver lesion. Gallbladder contains several small gallstones. No pericholecystic fluid or inflammation. No biliary ductal dilatation or intraductal gallstones. Pancreas: Moderate pancreatic atrophy. No pancreatic ductal dilatation or surrounding inflammatory changes. Spleen:  Normal in size. No concerning splenic lesions. Adrenals/Urinary Tract: Normal adrenal glands. Kidneys are normally located with symmetric enhancement and excretion. No suspicious renal lesion, urolithiasis or hydronephrosis. Normal bladder. Stomach/Bowel: Small sliding-type hiatal hernia. Distal stomach and duodenum are unremarkable. No small bowel thickening or dilatation. A normal appendix is visualized. No colonic dilatation or wall thickening. Vascular/Lymphatic: Atherosclerotic calcifications within the abdominal aorta and branch vessels. No aneurysm or ectasia. No enlarged abdominopelvic lymph nodes. Reproductive: Slightly retroverted uterus. No concerning adnexal lesions. Other: No abdominopelvic free fluid or free gas. No bowel containing hernias. Musculoskeletal: Acute to subacute appearing anterior wedging compression deformity T12 with a to 40% height loss fracture line may propagate into the posterior vertebral body cortex but without discernible transosseous extension into the posterior tension band. No other acute fractures are seen. Mixed sclerotic and lucent changes are present L2-L4 centered upon focal discogenic changes at a region of maximal levocurvature in the spine. These are favored to reflect Modic type endplate changes. Sclerotic features isolated to the iliac side of the bilateral SI joints may reflect sequela of prior osteitis condensans. Findings on a background of moderate bilateral SI joint and bilateral hip osteoarthrosis including chondrocalcinosis which can reflect CPPD. IMPRESSION: 1. Acute to subacute appearing anterior wedging compression deformity T12 with a to 40% height loss anteriorly. Fracture line may propagate into the posterior vertebral body cortex but without discernible transosseous extension into the posterior tension band. 2. Cholelithiasis without evidence of acute cholecystitis. 3. Small sliding-type hiatal hernia. 4. Mosaic attenuation in the lung bases as well as  subpleural reticular opacities may reflect a combination of interstitial disease and small airways disease or air trapping. Consider further outpatient characterization with HRCT, as clinically warranted. 5. Aortic Atherosclerosis (ICD10-I70.0). Coronary artery atherosclerosis. Electronically Signed   By: PLovena LeM.D.   On: 02/12/2020 16:01   DG Chest Port 1 View  Result Date: 02/12/2020 CLINICAL DATA:  Shortness of breath. EXAM: PORTABLE CHEST 1 VIEW COMPARISON:  March 15, 2013 FINDINGS: Cardiomegaly. The hila and mediastinum are unremarkable. No pneumothorax. Increased interstitial opacities, new in the interval. No other acute abnormalities. IMPRESSION: Findings are most suggestive of cardiomegaly and mild edema.  Electronically Signed   By: Dorise Bullion III M.D   On: 02/12/2020 12:39    EKG: Independently reviewed.  NSR with rate 89; PVC with nonspecific ST changes which may be new from prior (2015)   Labs on Admission: I have personally reviewed the available labs and imaging studies at the time of the admission.  Pertinent labs:   Glucose 246 BNP 234.2 HS troponin 15, 18 Normal CBC A1c 9.0 on 11/10 UA: >500 glucose, 5 ketones, 30 protein   Assessment/Plan Principal Problem:   Compression fracture of lumbar spine, non-traumatic, initial encounter [facility]) Active Problems:   Controlled diabetes mellitus type 2 with complications ([place])   Hypertension associated with diabetes ([place])   Hyperlipidemia associated with type 2 diabetes mellitus ([facility])   Mild cognitive impairment with memory loss   Hypoxia   Compression fracture -Patient without apparent fall, presenting with lumbar compression fracture -IR consult in AM for possible kyphoplasty -NPO after MN in case procedure can be performed tomorrow -Kyphoplasty is indicated for patients with a >15% height loss with fracture in <3 months as well as for acute, intractable pain associated with fracture -SCDs for now -TLSO  ordered by neurosurgery with plan to PT/OT tomorrow -Pain control with morphine and lidocaine patch pre-procedure -Likely needs medication for osteoporosis  Hypoxia -Patient without known h/o hypoxia or c/o SOB, but found to be hypoxic while in the ER to as low as 83% -She is a long-term smoker but does not have known COPD -She has been in pain for the last 3 days and so splinting is a consideration -CXR with mild pulmonary edema, BNP mildly elevated and so CHF is a concern; she also reported DOE about a year ago but subsequent clinic notes do not mention this -She also has markedly elevated BP (pain, didn't take home meds) and so HTN crisis is also a consideration -She was given Lasix 20 mg in the ER and is stable at this time, without complaint on [place] O2 -She does not have LE edema -For now, will hold further Lasix -Will monitor on telemetry -Will request Echo -Continue prn [place] O2 -Encourage IS q2h while awake  Dementia -Cognition may also be impacted by hearing loss -There is concern for mild to moderate cognitive impairment on today's evaluation  HTN -Very poorly controlled BP while in the ER - likely from not taking medications today -However, it is possible that the concern for CHF is actually more of a hypertensive urgency -Will resume home Norvasc and Lisinopril-HCTZ -Will add prn IV hydralazine  HLD -Continue Crestor  DM -Recent A1c indicates very poor control -hold Actoplus Met -Cover with moderate-scale SSI  Tobacco dependence -Encourage cessation.   -Patch ordered   DNR -I have discussed code status with the patient and her daughter-in-law and  they are in agreement that the patient would not desire resuscitation and would prefer to die a natural death should that situation arise. -She will need a gold out of facility DNR form at the time of discharge    Note: This patient has been tested and is negative for the novel coronavirus COVID-19. The patient has been  fully vaccinated against COVID-19.    DVT prophylaxis:  SCDs Code Status:  DNR - confirmed with patient/family Family Communication: Daughter-in-law was present throughout evaluation Disposition Plan:  The patient is from: home  Anticipated d/c is to: home, possibly with [facility] services   Anticipated d/c date will depend on clinical response to treatment, but likely in 2-3  days  Patient is currently: acutely ill Consults called: Neurosurgery (telephone only); IR; PT/OT/TOC team Admission status:  Admit - It is my clinical opinion that admission to INPATIENT is reasonable and necessary because of the expectation that this patient will require hospital care that crosses at least 2 midnights to treat this condition based on the medical complexity of the problems presented.  Given the aforementioned information, the predictability of an adverse outcome is felt to be significant.    Karmen Bongo MD [facility]   How to contact the [facility] Attending or Consulting provider [place] or covering provider during after hours [place], for this patient?  1. Check the care team in [facility] and look for a) attending/consulting [facility] provider listed and b) the [facility] team listed 2. Log into [email or web address] and use [place]'s universal password to access. If you do not have the password, please contact the hospital operator. 3. Locate the [facility] provider you are looking for under [facility] and page to a number that you can be directly reached. 4. If you still have difficulty reaching the provider, please page the [facility] (Director on Call) for the Hospitalists listed on amion for assistance.   02/12/2020, 5:33 PM

## 2020-02-12 NOTE — Progress Notes (Signed)
Orthopedic Tech Progress Note Patient Details:  Kristin Cook Dec 26, 1931 [id number] Delivered TLSO brace to patient to be fitted upon admittance. Patient ID: Kristin Cook, female   DOB: May 15, 1931, 86 y.o.   MRN: [id number]   Gerald Stabs 02/12/2020, 5:08 PM

## 2020-02-12 NOTE — ED Triage Notes (Signed)
Pt arrived by EMS c/o lower back pain that has been going on for past 4 days. States that it got worse today and her daughter in law call medics.  Rates pain 6/10. Pt denies trauma or falls Medics report O2 sats in the 80s and placed pt on 4L nasal canula   States she has had a lack of appetite for a long while.  A&Ox4

## 2020-02-12 NOTE — ED Notes (Signed)
Ortho tech called and at bedside to TLSO brace education.

## 2020-02-12 NOTE — ED Provider Notes (Signed)
[facility] EMERGENCY DEPARTMENT Provider Note   CSN: [id number] Arrival date & time: 02/12/20  1139     History Chief Complaint  Patient presents with  . Back Pain    Kristin Cook is a 86 y.o. female.  Patient is an 87 year old female with a history of mild dementia, hypertension, diabetes, ongoing tobacco use who presents today with complaint of back pain.  Patient reports she has been having back pain for the last 3 or 4 days.  She points to the upper lumbar area of her spine.  She reports that she has been taking some Aleve at home which seemed to help but today the pain was more severe and she was unable to get out of the chair by the table.  She denies any pain radiating down into her legs or weakness of her legs.  She reports now since getting here the pain is moving around to her abdomen a little bit and she feels slightly short of breath.  She has not had any new cough, congestion or fever.  She does not use inhalers or oxygen at home.  She has had no recent medication changes.  She reports she is always had a poor appetite but is still drinking plenty of fluids and has not had any weight changes.  Urine and bowel habits have been unchanged.  No prior surgeries noted.  The history is provided by the patient and a relative.  Back Pain      Past Medical History:  Diagnosis Date  . Arthritis   . Cataract   . Cystocele   . Dementia ([place])    forgetfulness  . Diabetes mellitus   . Dyslipidemia   . History of colonic polyps   . Hx MRSA infection   . Hyperlipidemia   . Hypertension   . Mental disorder   . Neuropathic pain of lower extremity   . OAB (overactive bladder)   . Shortness of breath    with exertion    Patient Active Problem List   Diagnosis Date Noted  . Mild cognitive impairment with memory loss 09/21/2018  . Onychomycosis of multiple toenails with type 2 diabetes mellitus ([place]) 10/22/2015  . Presbycusis of both ears 06/19/2015  .  Severe nonproliferative diabetic retinopathy of left eye with macular edema ([place]) 12/30/2011  . Arthritis 09/29/2011  . Controlled diabetes mellitus type 2 with complications ([place]) 62/22/9798  . Hypertension associated with diabetes ([place]) 11/05/2010  . Hyperlipidemia associated with type 2 diabetes mellitus ([place]) 11/05/2010    Past Surgical History:  Procedure Laterality Date  . ABDOMINAL HYSTERECTOMY    . EYE SURGERY Bilateral    Cataract  . PARS PLANA VITRECTOMY Left 03/15/2013   Procedure: PARS PLANA VITRECTOMY WITH 25 GAUGE LEFT EYE with Endolaser;  Surgeon: Hurman Horn, MD;  Location: [facility];  Service: Ophthalmology;  Laterality: Left;  Marland Kitchen VAGINAL PROLAPSE REPAIR  2007   Dr. Gaetano Net     OB History   No obstetric history on file.     Family History  Problem Relation Age of Onset  . Healthy Mother   . Diabetes Father   . Hypertension Neg Hx     Social History   Tobacco Use  . Smoking status: Former Research scientist (life sciences)  . Smokeless tobacco: Never Used  . Tobacco comment: Several years ago  Vaping Use  . Vaping Use: Never used  Substance Use Topics  . Alcohol use: No  . Drug use: No  Home Medications Prior to Admission medications   Medication Sig Start Date End Date Taking? Authorizing Provider  amLODipine (NORVASC) 10 MG tablet TAKE 1 TABLET(10 MG) BY MOUTH DAILY 09/13/19   [name], MD  bismuth subsalicylate (PEPTO BISMOL) 262 MG/15ML suspension Take 30 mLs by mouth every 6 (six) hours as needed.    [provider]  diphenhydramine-acetaminophen (TYLENOL PM) 25-500 MG TABS Take 1 tablet by mouth at bedtime as needed.    [provider]  doxycycline (VIBRAMYCIN) 100 MG capsule Take 1 capsule (100 mg total) by mouth 2 (two) times daily. Patient not taking: Reported on 02/01/2018 10/04/17   Wieters, Hallie C, PA-C  linagliptin (TRADJENTA) 5 MG TABS tablet TAKE 1 TABLET(5 MG) BY MOUTH DAILY 09/13/19   [name], MD  lisinopril-hydrochlorothiazide  (ZESTORETIC) 20-12.5 MG tablet Take 1 tablet by mouth daily. 09/13/19   [name], MD  loperamide (IMODIUM) 2 MG capsule Take 2 mg by mouth as needed for diarrhea or loose stools.    [provider]  mupirocin ointment (BACTROBAN) 2 % Apply 1 application topically 2 (two) times daily. Patient not taking: Reported on 09/13/2019 10/04/17   Joneen Caraway, Vivian C, PA-C  [facility] ULTRA test strip USE AS DIRECTED 06/27/19   [name], MD  pioglitazone-metformin (ACTOPLUS MET) [phone] MG tablet TAKE 1 TABLET BY MOUTH TWICE DAILY WITH A MEAL 11/21/19   [name], MD  rosuvastatin (CRESTOR) 20 MG tablet TAKE 1 TABLET(20 MG) BY MOUTH DAILY 09/13/19   [name], MD    Allergies    Penicillins, Sulfa antibiotics, and Sulfamethoxazole  Review of Systems   Review of Systems  Musculoskeletal: Positive for back pain.  All other systems reviewed and are negative.   Physical Exam Updated Vital Signs BP (!) 169/105 (BP Location: Right Arm)   Pulse 93   Temp 98.6 F (37 C) (Oral)   Resp (!) 24   SpO2 96%   Physical Exam Vitals and nursing note reviewed.  Constitutional:      General: She is not in acute distress.    Appearance: Normal appearance. She is well-developed.  HENT:     Head: Normocephalic and atraumatic.  Eyes:     Pupils: Pupils are equal, round, and reactive to light.  Cardiovascular:     Rate and Rhythm: Normal rate and regular rhythm.     Pulses: Normal pulses.     Heart sounds: Normal heart sounds. No murmur heard.  No friction rub.  Pulmonary:     Effort: Pulmonary effort is normal. Tachypnea present.     Breath sounds: Normal breath sounds. No wheezing or rales.  Abdominal:     General: Bowel sounds are normal. There is no distension.     Palpations: Abdomen is soft.     Tenderness: There is no abdominal tenderness. There is no guarding or rebound.     Comments: Mild diffuse tenderness without rebound or guarding  Musculoskeletal:        General: No  tenderness. Normal range of motion.     Right lower leg: No edema.     Left lower leg: No edema.     Comments: No edema.  Kyphosis noted.  No localized pain over the lower thoracic or upper lumbar area.  Skin:    General: Skin is warm and dry.     Findings: No rash.  Neurological:     General: No focal deficit present.     Mental Status: She  is alert and oriented to person, place, and time. Mental status is at baseline.     Cranial Nerves: No cranial nerve deficit.     Sensory: No sensory deficit.     Motor: No weakness.     Comments: No pronator drift noted in all 4 extremities.  Sensation and strength is intact in all 4 extremities  Psychiatric:        Mood and Affect: Mood normal.        Behavior: Behavior normal.        Thought Content: Thought content normal.     ED Results / Procedures / Treatments   Labs (all labs ordered are listed, but only abnormal results are displayed) Labs Reviewed  CBC WITH DIFFERENTIAL/PLATELET - Abnormal; Notable for the following components:      Result Value   Lymphs Abs 0.6 (*)    All other components within normal limits  COMPREHENSIVE METABOLIC PANEL - Abnormal; Notable for the following components:   Sodium 134 (*)    Glucose, Bld 246 (*)    Calcium 8.8 (*)    Total Protein 6.4 (*)    Albumin 3.4 (*)    AST 13 (*)    All other components within normal limits  URINALYSIS, ROUTINE W REFLEX MICROSCOPIC - Abnormal; Notable for the following components:   Color, Urine STRAW (*)    Specific Gravity, Urine 1.003 (*)    Glucose, UA >=500 (*)    Ketones, ur 5 (*)    Protein, ur 30 (*)    All other components within normal limits  BRAIN NATRIURETIC PEPTIDE  TROPONIN I (HIGH SENSITIVITY)  TROPONIN I (HIGH SENSITIVITY)    EKG EKG Interpretation  Date/Time:  Sunday February 12 2020 13:22:01 EST Ventricular Rate:  89 PR Interval:    QRS Duration: 105 QT Interval:  383 QTC Calculation: 466 R Axis:   -76 Text Interpretation: Sinus  rhythm Ventricular premature complex Inferior infarct, old Anterior infarct, old Lateral leads are also involved , new T wave inversion from prior tracing 2015 Confirmed by Blanchie Dessert [phone]) on 02/12/2020 1:31:55 PM   Radiology DG Chest Port 1 View  Result Date: 02/12/2020 CLINICAL DATA:  Shortness of breath. EXAM: PORTABLE CHEST 1 VIEW COMPARISON:  March 15, 2013 FINDINGS: Cardiomegaly. The hila and mediastinum are unremarkable. No pneumothorax. Increased interstitial opacities, new in the interval. No other acute abnormalities. IMPRESSION: Findings are most suggestive of cardiomegaly and mild edema. Electronically Signed   By: Dorise Bullion III M.D   On: 02/12/2020 12:39    Procedures Procedures (including critical care time)  Medications Ordered in ED Medications  acetaminophen (TYLENOL) tablet 1,000 mg (has no administration in time range)  lidocaine (LIDODERM) 5 % 1 patch (has no administration in time range)    ED Course  I have reviewed the triage vital signs and the nursing notes.  Pertinent labs & imaging results that were available during my care of the patient were reviewed by me and considered in my medical decision making (see chart for details).    MDM Rules/Calculators/A&P                          Elderly female presenting today with complaint of worsening back pain.  Patient unable to stand from her table today when family came to check on her.  Patient has had pain for the last 3 to 4 days but is still been mobile until this morning.  She  denies any falls and has no radiation of pain.  She is complaining of some abdominal pain as well at this time.  No infectious symptoms recently and no change in appetite or vomiting.  On exam patient is slightly tachypneic and reports diffuse abdominal discomfort but no localized pain.  Vital signs with hypertension and tachypnea but normal heart rate and oxygen saturation.  No known Covid contacts and patient has received the  vaccine.  Patient did take Aleve at some point but cannot remember if she took it this morning.  She reports she does not do well with pain medications so she chose Tylenol and Lidoderm patch at this time.  Given patient's shortness of breath could be related to ongoing tobacco abuse and pain however also concern for possible pneumonia, acute cardiac event, acute abdominal process.  Also possibility for lumbar/thoracic compression fracture.  Imaging and labs are pending.  2:17 PM Labs show normal urine, CBC without acute findings, CMP with hyperglycemia but no other acute findings, troponin of 15 and BNP is pending.  Chest x-ray shows cardiomegaly and mild edema.  Patient's EKG shows some new nonspecific T wave changes in lateral leads when compared to the patient's EKG in 2015.  We will get a delta troponin.  CT is pending.  Final Clinical Impression(s) / ED Diagnoses Final diagnoses:  None    Rx / DC Orders ED Discharge Orders    None       Blanchie Dessert, MD 02/12/20 [phone]

## 2020-02-12 NOTE — Progress Notes (Signed)
HOSPITAL MEDICINE OVERNIGHT EVENT NOTE    Notified by nursing that patient's COVID-19 PCR testing has come back positive.  Chart reviewed, patient initially presented to [facility] emergency department with complaints of back pain.  Patient was found to be suffering from a T12 compression fracture.  Incidentally, during emergency department stay patient is been found to be hypoxic with oxygen saturations in the mid 80s.  Chest x-ray reveals mild bilateral patchy infiltrates.  Oxygen requirements increasing throughout the hospital stay, now on 3 L of oxygen via nasal cannula.  With patient's COVID-19 PCR testing coming back positive, the hypoxia and notable x-ray findings patient would benefit from being initiated on intravenous remdesivir and dexamethasone.  Will admit patient to COVID-19 unit and initiate these medications now.  I have discussed these findings with the patient and updated her on plan of care.  Marinda Elk  MD [facility]

## 2020-02-12 NOTE — ED Provider Notes (Signed)
Blood pressure (!) 174/73, pulse 78, temperature 98.6 F (37 C), temperature source Oral, resp. rate (!) 32, SpO2 (!) 89 %.  Assuming care from Dr. Anitra Lauth.  In short, Kristin Cook is a 86 y.o. female with a chief complaint of Back Pain .  Refer to the original H&P for additional details.  The current plan of care is to f/u on CT and remaining labs.  04:30 PM  Ct imaging shows T12 compression/wedge fx with 40% height loss which correlates with her exam and pain. Patient also with findings at the lung bases. Reviewed radiology differential. Plan for lasix with mild hypoxemia and elevated BNP. Lower suspicion for PNA. Hold on abx. Spoke with Dr. Jake Samples about the spine fx. Plan for TLSO brace, PT/OT, can remove for bathing. Plan for f/u with him in 2-3 weeks for x-rays.  Discussed patient's case with [facility] to request admission. Patient and family (if present) updated with plan. Care transferred to [facility] service.  I reviewed all nursing notes, vitals, pertinent old records, EKGs, labs, imaging (as available).    Maia Plan, MD 02/15/20 [phone]

## 2020-02-13 ENCOUNTER — Inpatient Hospital Stay (HOSPITAL_COMMUNITY): Payer: Medicare Other

## 2020-02-13 ENCOUNTER — Other Ambulatory Visit: Payer: Self-pay

## 2020-02-13 ENCOUNTER — Inpatient Hospital Stay: Payer: Medicare Other

## 2020-02-13 DIAGNOSIS — U071 COVID-19: Secondary | ICD-10-CM | POA: Diagnosis not present

## 2020-02-13 DIAGNOSIS — R0602 Shortness of breath: Secondary | ICD-10-CM

## 2020-02-13 DIAGNOSIS — I361 Nonrheumatic tricuspid (valve) insufficiency: Secondary | ICD-10-CM | POA: Diagnosis not present

## 2020-02-13 DIAGNOSIS — S22080A Wedge compression fracture of T11-T12 vertebra, initial encounter for closed fracture: Secondary | ICD-10-CM

## 2020-02-13 DIAGNOSIS — I342 Nonrheumatic mitral (valve) stenosis: Secondary | ICD-10-CM | POA: Diagnosis not present

## 2020-02-13 DIAGNOSIS — R7989 Other specified abnormal findings of blood chemistry: Secondary | ICD-10-CM | POA: Diagnosis not present

## 2020-02-13 LAB — COMPREHENSIVE METABOLIC PANEL
ALT: 14 U/L (ref 0–44)
AST: 14 U/L — ABNORMAL LOW (ref 15–41)
Albumin: 3.4 g/dL — ABNORMAL LOW (ref 3.5–5.0)
Alkaline Phosphatase: 87 U/L (ref 38–126)
Anion gap: 14 (ref 5–15)
BUN: 18 mg/dL (ref 8–23)
CO2: 25 mmol/L (ref 22–32)
Calcium: 8.8 mg/dL — ABNORMAL LOW (ref 8.9–10.3)
Chloride: 96 mmol/L — ABNORMAL LOW (ref 98–111)
Creatinine, Ser: 0.99 mg/dL (ref 0.44–1.00)
GFR, Estimated: 56 mL/min — ABNORMAL LOW (ref >60)
Glucose, Bld: 257 mg/dL — ABNORMAL HIGH (ref 70–99)
Potassium: 4.1 mmol/L (ref 3.5–5.1)
Sodium: 135 mmol/L (ref 135–145)
Total Bilirubin: 0.9 mg/dL (ref 0.3–1.2)
Total Protein: 6.9 g/dL (ref 6.5–8.1)

## 2020-02-13 LAB — ECHOCARDIOGRAM LIMITED
Area-P 1/2: 3.42 cm2
MV VTI: 1.9 cm2
S' Lateral: 2.4 cm

## 2020-02-13 LAB — CBC WITH DIFFERENTIAL/PLATELET
Abs Immature Granulocytes: 0.04 10*3/uL (ref 0.00–0.07)
Basophils Absolute: 0 10*3/uL (ref 0.0–0.1)
Basophils Relative: 0 %
Eosinophils Absolute: 0 10*3/uL (ref 0.0–0.5)
Eosinophils Relative: 0 %
HCT: 45.9 % (ref 36.0–46.0)
Hemoglobin: 15 g/dL (ref 12.0–15.0)
Immature Granulocytes: 1 %
Lymphocytes Relative: 8 %
Lymphs Abs: 0.5 10*3/uL — ABNORMAL LOW (ref 0.7–4.0)
MCH: 29.4 pg (ref 26.0–34.0)
MCHC: 32.7 g/dL (ref 30.0–36.0)
MCV: 89.8 fL (ref 80.0–100.0)
Monocytes Absolute: 0.2 10*3/uL (ref 0.1–1.0)
Monocytes Relative: 3 %
Neutro Abs: 5.6 10*3/uL (ref 1.7–7.7)
Neutrophils Relative %: 88 %
Platelets: 248 10*3/uL (ref 150–400)
RBC: 5.11 MIL/uL (ref 3.87–5.11)
RDW: 12.8 % (ref 11.5–15.5)
WBC: 6.3 10*3/uL (ref 4.0–10.5)
nRBC: 0 % (ref 0.0–0.2)

## 2020-02-13 LAB — C-REACTIVE PROTEIN: CRP: 1.5 mg/dL — ABNORMAL HIGH (ref <1.0)

## 2020-02-13 LAB — CBG MONITORING, ED
Glucose-Capillary: 112 mg/dL — ABNORMAL HIGH (ref 70–99)
Glucose-Capillary: 303 mg/dL — ABNORMAL HIGH (ref 70–99)
Glucose-Capillary: 158 mg/dL — ABNORMAL HIGH (ref 70–99)
Glucose-Capillary: 195 mg/dL — ABNORMAL HIGH (ref 70–99)
Glucose-Capillary: 198 mg/dL — ABNORMAL HIGH (ref 70–99)

## 2020-02-13 LAB — PHOSPHORUS: Phosphorus: 3.7 mg/dL (ref 2.5–4.6)

## 2020-02-13 LAB — D-DIMER, QUANTITATIVE: D-Dimer, Quant: 1.73 ug/mL-FEU — ABNORMAL HIGH (ref 0.00–0.50)

## 2020-02-13 LAB — MAGNESIUM: Magnesium: 1.7 mg/dL (ref 1.7–2.4)

## 2020-02-13 MED ORDER — SENNOSIDES-DOCUSATE SODIUM 8.6-50 MG PO TABS
2.0000 | ORAL_TABLET | Freq: Two times a day (BID) | ORAL | Status: DC
  Administered 2020-02-13 – 2020-02-18 (×11): 2 via ORAL
  Filled 2020-02-13 (×11): qty 2

## 2020-02-13 MED ORDER — CALCIUM CARBONATE-VITAMIN D 500-200 MG-UNIT PO TABS
1.0000 | ORAL_TABLET | Freq: Two times a day (BID) | ORAL | Status: DC
  Administered 2020-02-13 – 2020-02-18 (×11): 1 via ORAL
  Filled 2020-02-13 (×13): qty 1

## 2020-02-13 NOTE — ED Notes (Signed)
hospitalist at bedside

## 2020-02-13 NOTE — Evaluation (Addendum)
Physical Therapy Evaluation Patient Details Name: JADELYN ELKS MRN: [id number] DOB: 06/10/1931 Today's Date: 02/13/2020   History of Present Illness  86yo female presenting with back pain, CT positive for T12 compression fracture. Noted to have increasing supplemental O2 needs in the ED and incidentally found to be Covid positive. PMH dementia, DM, dyslipidemia, HLD, HTN, unspecified mental disorder, DOE  Clinical Impression   Patient received in bed, very confused and A&O to self only- able to tell me that her back hurts but unable to tell me what brought her to the ED or that she was even in a hospital. Very easily agitated- became quite frustrated with me when I enforced back precautions and brace wear for safety, and agitation progressed while sitting at EOB after brace application to the point it became unsafe to attempt ambulation with her. SpO2 >94% on 2LPM with activity. Tells me multiple times "I can take my back with me, I'd imagine the fracture will follow me home too", and "I'm going to go home no matter what you say and take my pain and my back home with me!" Returned to supine in the ED stretcher and left positioned to comfort with all needs met, RN aware of patient status.     Follow Up Recommendations SNF;Supervision/Assistance - 24 hour    Equipment Recommendations  None recommended by PT    Recommendations for Other Services       Precautions / Restrictions Precautions Precautions: Fall;Other (comment) Precaution Comments: back precautions, TLSO for EOB/OOB Required Braces or Orthoses: Spinal Brace Spinal Brace: Thoracolumbosacral orthotic Other Brace: no specifics found in order set or notes in terms of donning sitting vs supine, RN reports she was told it is just for OOB/ambulation Restrictions Weight Bearing Restrictions: No      Mobility  Bed Mobility Overal bed mobility: Needs Assistance Bed Mobility: Rolling;Sidelying to Sit;Sit to Sidelying Rolling: Mod  assist Sidelying to sit: Min assist     Sit to sidelying: Min assist General bed mobility comments: ModA to prevent spine from twisting when rolling, MinA to power trunk up to sitting/return to supine while maintaining precautions    Transfers                 General transfer comment: refused- agitation  Ambulation/Gait             General Gait Details: refused- agitation  Stairs            Wheelchair Mobility    Modified Rankin (Stroke Patients Only)       Balance Overall balance assessment: Mild deficits observed, not formally tested                                           Pertinent Vitals/Pain Pain Assessment: Faces Faces Pain Scale: Hurts even more Pain Location: back Pain Descriptors / Indicators: Discomfort;Grimacing;Moaning Pain Intervention(s): Limited activity within patient's tolerance;Monitored during session;Repositioned    Home Living Family/patient expects to be discharged to:: Private residence                 Additional Comments: very poor historian and unable to accurately answer questions- per RN, she was living in the basement apartment at her daughters home and has baseline dementia, possibly used cane. will need to clarify further details with family when able.    Prior Function Level of Independence: Independent with assistive device(s)  Hand Dominance        Extremity/Trunk Assessment   Upper Extremity Assessment Upper Extremity Assessment: Generalized weakness    Lower Extremity Assessment Lower Extremity Assessment: Generalized weakness    Cervical / Trunk Assessment Cervical / Trunk Assessment: Kyphotic  Communication   Communication: No difficulties  Cognition Arousal/Alertness: Awake/alert Behavior During Therapy: Restless;Agitated;Impulsive Overall Cognitive Status: Impaired/Different from baseline Area of Impairment: Orientation;Attention;Memory;Following  commands;Safety/judgement;Awareness;Problem solving                 Orientation Level: Disoriented to;Place;Time;Situation Current Attention Level: Sustained Memory: Decreased recall of precautions;Decreased short-term memory Following Commands: Follows one step commands inconsistently Safety/Judgement: Decreased awareness of safety;Decreased awareness of deficits Awareness: Intellectual Problem Solving: Slow processing;Decreased initiation;Difficulty sequencing;Requires verbal cues;Requires tactile cues General Comments: easily agitated and basically no awareness of safety or current impairments, needed Max tactile and verbal cues, hand over hand assist to maintain back precautions      General Comments General comments (skin integrity, edema, etc.): refused to get OOB with me and progressively became more agitated- RN reports she was steady when found walking around the room earlier    Exercises     Assessment/Plan    PT Assessment Patient needs continued PT services  PT Problem List Decreased strength;Decreased cognition;Decreased safety awareness;Decreased balance;Decreased knowledge of precautions;Pain;Decreased mobility;Decreased coordination       PT Treatment Interventions DME instruction;Balance training;Gait training;Stair training;Cognitive remediation;Functional mobility training;Patient/family education;Therapeutic activities;Therapeutic exercise    PT Goals (Current goals can be found in the Care Plan section)  Acute Rehab PT Goals PT Goal Formulation: Patient unable to participate in goal setting Time For Goal Achievement: 02/27/20 Potential to Achieve Goals: Fair    Frequency Min 3X/week   Barriers to discharge        Co-evaluation               AM-PAC PT "6 Clicks" Mobility  Outcome Measure Help needed turning from your back to your side while in a flat bed without using bedrails?: A Lot Help needed moving from lying on your back to sitting on  the side of a flat bed without using bedrails?: A Little Help needed moving to and from a bed to a chair (including a wheelchair)?: A Little Help needed standing up from a chair using your arms (e.g., wheelchair or bedside chair)?: A Little Help needed to walk in hospital room?: A Little Help needed climbing 3-5 steps with a railing? : A Lot 6 Click Score: 16    End of Session Equipment Utilized During Treatment: Other (comment) (TLSO) Activity Tolerance: Treatment limited secondary to agitation Patient left: in bed;with call bell/phone within reach (ED stretcher) Nurse Communication: Mobility status;Precautions PT Visit Diagnosis: Unsteadiness on feet (R26.81);Muscle weakness (generalized) (M62.81);Pain Pain - Right/Left:  (midline) Pain - part of body:  (back pain)    Time: 2800-3491 PT Time Calculation (min) (ACUTE ONLY): 17 min   Charges:   PT Evaluation $PT Eval Moderate Complexity: 1 Mod          Windell Norfolk, DPT, PN1   Supplemental Physical Therapist [place]    Pager [phone] Acute Rehab Office [phone]

## 2020-02-13 NOTE — Progress Notes (Signed)
PROGRESS NOTE                                                                             PROGRESS NOTE                                                                                                                                                                                                             Patient Demographics:    Kristin Cook, is a 86 y.o. female, DOB - 1931-09-23, RCB:[id number]  Outpatient Primary MD for the patient is [name], MD    LOS - 1  Admit date - 02/12/2020    Chief Complaint  Patient presents with  . Back Pain       Brief Narrative    86 y.o. female with medical history significant of HTN; HLD; DM; and dementia presenting with back pain.  She was having back pain for several days, she couldn't get out of the chair prompted ED.  No h/o similar.  She denies falling.  She did complain that her feet were numb.  She denies SOB.  No CP.  Some mild LE edema recently. ED Course:  Back pain today, unable to get up, no apparent injury.  CT with T12 compression fracture.  Dr. Reatha Armour consulted, recommends TLSO, PT/OT (not planning to see since no neuro deficits).  Also with SOB, hs a smoker, sats 80-90s, on 2L.  CXR with ?edema.  CT abdomen with ?ILD.  Given low-dose Lasix.    As well she came positive for COVID-19, she is fully vaccinated.   Subjective:    [name] today is confused as discussed with staff, she complains of back pain, she denies any dyspnea or cough.    Assessment  & Plan :    Principal Problem:   Compression fracture of lumbar spine, non-traumatic, initial encounter [facility]) Active Problems:   Controlled diabetes mellitus type 2 with complications ([place])   Hypertension associated with diabetes ([place])   Hyperlipidemia associated with type 2 diabetes mellitus ([place])   Mild  cognitive impairment with memory loss   Hypoxia   COVID-19 virus infection   Compression fracture -Patient without apparent  fall, presenting with lumbar compression fracture -Discussed with IR, will need further evaluation before decision about kyphoplasty, MRI of thoracic spine is pending. -Admitting physician discussed with neurosurgery on-call, recommendation for TLSO. -We will start on calcium and vitamin D.  Hypoxia -Patient without known h/o hypoxia or c/o SOB, but found to be hypoxic while in the ER to as low as 83% -She is a long-term smoker but does not have known COPD -She has been in pain for the last 3 days and so splinting is a consideration -Imaging significant for interstitial lung disease, with a groundglass opacity, have discussed with pulmonary CT findings, findings more likely due to chronic lung disease either ILD, less likely COPD, much of acute inflammation at this point related to Covid, but steroids should be helpful either in Covid or interstitial lung disease, she will need outpatient work-up once acute event has resolved to evaluate for interstitial lung disease with high resolution CT chest , -She was encouraged to use incentive spirometer, but she is demented and this might be difficult to do. -Wean oxygen as tolerated. -Follow on 2D echo.  COVID-19 infection -Patient is fully vaccinated, she tested positive for COVID-19 on screening, she is hypoxic, with some abnormal finding on imaging, unclear if this would be related to volume overload/ILD versus Covid, so she was empirically started on steroids and Remdesivir. -Continue to trend inflammatory markers, D-dimers most likely elevated in the setting of inflammation due to Covid, but will check venous Dopplers.  Acute metabolic encephalopathy -Cognition may also be impacted by hearing loss, son reports mother lives with them but still driving , there is some mild decline in cognition for last 6 months, but she does not appear to be having significant or profound dementia, I think had more confusion and restlessness today is more related to  her lower back pain and hospital delirium.  HTN -Elevated, continue with home medication, continue with as needed hydralazine, hopefully will be better controlled chest pain is controlled,  HLD -Continue Crestor  DM -Recent A1c indicates very poor control -hold Actoplus Met -Cover with moderate-scale SSI  Tobacco dependence -Encourage cessation.   -Patch ordered    SpO2: 100 % O2 Flow Rate (L/min): 2 L/min  Recent Labs  Lab 02/12/20 1208 02/12/20 2030 02/13/20 0537  WBC 6.3  --  6.3  PLT 221  --  248  CRP  --   --  1.5*  BNP 234.2*  --   --   DDIMER  --   --  1.73*  AST 13*  --  14*  ALT 14  --  14  ALKPHOS 82  --  87  BILITOT 1.0  --  0.9  ALBUMIN 3.4*  --  3.4*  SARSCOV2NAA  --  POSITIVE*  --        ABG  No results found for: PHART, PCO2ART, PO2ART, HCO3, TCO2, ACIDBASEDEF, O2SAT          Condition - Extremely Guarded  Family Communication  : Discussed with son [name] by phone  Code Status :  DNR  Consults  :  IR, neurosurgery by phone.  Procedures  :  none  Disposition Plan  :    Status is: Inpatient  Remains inpatient appropriate because:IV treatments appropriate due to intensity of illness or inability to take PO   Dispo: The patient is from: Home  Anticipated d/c is to: SNF              Anticipated d/c date is: 3 days              Patient currently is not medically stable to d/c.      DVT Prophylaxis  :  Lovenox >> will hold evening dose for possible kyphoplasty tomorrow  Lab Results  Component Value Date   PLT 248 02/13/2020    Diet :  Diet Order            DIET DYS 2 Room service appropriate? Yes; Fluid consistency: Thin  Diet effective now                  Inpatient Medications  Scheduled Meds: . albuterol  2 puff Inhalation Q6H  . amLODipine  10 mg Oral Daily  . vitamin C  500 mg Oral Daily  . dexamethasone (DECADRON) injection  6 mg Intravenous Daily  . docusate sodium  100 mg Oral BID   . enoxaparin (LOVENOX) injection  40 mg Subcutaneous Q24H  . lisinopril  20 mg Oral Daily   And  . hydrochlorothiazide  12.5 mg Oral Daily  . insulin aspart  0-15 Units Subcutaneous TID WC  . insulin aspart  0-5 Units Subcutaneous QHS  . lidocaine  1 patch Transdermal Q24H  . nicotine  14 mg Transdermal Daily  . rosuvastatin  20 mg Oral Daily  . sodium chloride flush  3 mL Intravenous Q12H  . zinc sulfate  220 mg Oral Daily   Continuous Infusions: . remdesivir 100 mg in NS 100 mL Stopped (02/13/20 1122)   PRN Meds:.acetaminophen **OR** acetaminophen, bisacodyl, guaiFENesin-dextromethorphan, hydrALAZINE, morphine injection, ondansetron **OR** ondansetron (ZOFRAN) IV, oxyCODONE, polyethylene glycol  Antibiotics  :    Anti-infectives (From admission, onward)   Start     Dose/Rate Route Frequency Ordered Stop   02/13/20 1000  remdesivir 100 mg in sodium chloride 0.9 % 100 mL IVPB       "Followed by" Linked Group Details   100 mg 200 mL/hr over 30 Minutes Intravenous Daily 02/12/20 2243 02/17/20 0959   02/12/20 2330  remdesivir 200 mg in sodium chloride 0.9% 250 mL IVPB       "Followed by" Linked Group Details   200 mg 580 mL/hr over 30 Minutes Intravenous Once 02/12/20 2243 02/13/20 0105       Emeline Gins Anju Sereno M.D on 02/13/2020 at 11:25 AM  To page go to [email or web address]   [facility] -  Office  [phone]   See all Orders from today for further details    Objective:   Vitals:   02/13/20 0900 02/13/20 0915 02/13/20 1000 02/13/20 1100  BP: (!) 165/80 (!) 157/80 (!) 155/81 (!) 168/79  Pulse: 83 87 80 78  Resp: (!) 24 (!) _0 Temp:      TempSrc:      SpO2: 100% 100% 100% 100%    Wt Readings from Last 3 Encounters:  01/18/20 62.9 kg  09/13/19 63.4 kg  05/26/19 64.8 kg     Intake/Output Summary (Last 24 hours) at 02/13/2020 1125 Last data filed at 02/13/2020 0105 Gross per 24 hour  Intake 227.87 ml  Output --  Net 227.87 ml     Physical  Exam  Awake, frail, confused   Symmetrical Chest wall movement, Good air movement bilaterally, CTAB RRR,No Gallops,Rubs or new Murmurs, No Parasternal Heave +ve B.Sounds, Abd Soft. No Cyanosis, Clubbing or edema, No new  Rash or bruise      Data Review:    CBC Recent Labs  Lab 02/12/20 1208 02/13/20 0537  WBC 6.3 6.3  HGB 13.7 15.0  HCT 41.6 45.9  PLT 221 248  MCV 89.8 89.8  MCH 29.6 29.4  MCHC 32.9 32.7  RDW 12.9 12.8  LYMPHSABS 0.6* 0.5*  MONOABS 0.6 0.2  EOSABS 0.1 0.0  BASOSABS 0.0 0.0    Recent Labs  Lab 02/12/20 1208 02/13/20 0537  NA 134* 135  K 4.1 4.1  CL 99 96*  CO2 24 25  GLUCOSE 246* 257*  BUN 12 18  CREATININE 0.80 0.99  CALCIUM 8.8* 8.8*  AST 13* 14*  ALT 14 14  ALKPHOS 82 87  BILITOT 1.0 0.9  ALBUMIN 3.4* 3.4*  MG  --  1.7  CRP  --  1.5*  DDIMER  --  1.73*  BNP 234.2*  --     ------------------------------------------------------------------------------------------------------------------ No results for input(s): CHOL, HDL, LDLCALC, TRIG, CHOLHDL, LDLDIRECT in the last 72 hours.  Lab Results  Component Value Date   HGBA1C 9.0 (A) 01/18/2020   ------------------------------------------------------------------------------------------------------------------ No results for input(s): TSH, T4TOTAL, T3FREE, THYROIDAB in the last 72 hours.  Invalid input(s): FREET3  Cardiac Enzymes No results for input(s): CKMB, TROPONINI, MYOGLOBIN in the last 168 hours.  Invalid input(s): CK ------------------------------------------------------------------------------------------------------------------    Component Value Date/Time   BNP 234.2 (H) 02/12/2020 1208    Micro Results Recent Results (from the past 240 hour(s))  Resp Panel by RT-PCR (Flu A&B, Covid) Nasopharyngeal Swab     Status: Abnormal   Collection Time: 02/12/20  8:30 PM   Specimen: Nasopharyngeal Swab; Nasopharyngeal(NP) swabs in vial transport medium  Result Value Ref Range  Status   SARS Coronavirus 2 by RT PCR POSITIVE (A) NEGATIVE Final    Comment: RESULT CALLED TO, READ BACK BY AND VERIFIED WITH: [place] 2203 02/12/2020 T. TYSOR (NOTE) SARS-CoV-2 target nucleic acids are DETECTED.  The SARS-CoV-2 RNA is generally detectable in upper respiratory specimens during the acute phase of infection. Positive results are indicative of the presence of the identified virus, but do not rule out bacterial infection or co-infection with other pathogens not detected by the test. Clinical correlation with patient history and other diagnostic information is necessary to determine patient infection status. The expected result is Negative.  Fact Sheet for Patients: EntrepreneurPulse.com.au  Fact Sheet for Healthcare Providers: IncredibleEmployment.be  This test is not yet approved or cleared by the [place] FDA and  has been authorized for detection and/or diagnosis of SARS-CoV-2 by FDA under an Emergency Use Authorization (EUA).  This EUA will remain in effect (meaning this test c an be used) for the duration of  the COVID-19 declaration under Section 564(b)(1) of the Act, 21 U.S.C. section 360bbb-3(b)(1), unless the authorization is terminated or revoked sooner.     Influenza A by PCR NEGATIVE NEGATIVE Final   Influenza B by PCR NEGATIVE NEGATIVE Final    Comment: (NOTE) The Xpert Xpress SARS-CoV-2/FLU/RSV plus assay is intended as an aid in the diagnosis of influenza from Nasopharyngeal swab specimens and should not be used as a sole basis for treatment. Nasal washings and aspirates are unacceptable for Xpert Xpress SARS-CoV-2/FLU/RSV testing.  Fact Sheet for Patients: EntrepreneurPulse.com.au  Fact Sheet for Healthcare Providers: IncredibleEmployment.be  This test is not yet approved or cleared by the [place] FDA and has been authorized for detection and/or diagnosis  of SARS-CoV-2 by FDA under an Emergency Use Authorization (EUA). This  EUA will remain in effect (meaning this test can be used) for the duration of the COVID-19 declaration under Section 564(b)(1) of the Act, 21 U.S.C. section 360bbb-3(b)(1), unless the authorization is terminated or revoked.  Performed at [facility], [place] [address] [place], [place] [address]     Radiology Reports CT ABDOMEN PELVIS W CONTRAST  Result Date: 02/12/2020 CLINICAL DATA:  Four days of worsening lower back pain EXAM: CT ABDOMEN AND PELVIS WITH CONTRAST TECHNIQUE: Multidetector CT imaging of the abdomen and pelvis was performed using the standard protocol following bolus administration of intravenous contrast. CONTRAST:  173m OMNIPAQUE IOHEXOL 300 MG/ML  SOLN COMPARISON:  None. FINDINGS: Lower chest: Coronary artery atherosclerosis. Calcification of the mitral annulus. Cardiac size within normal limits. No pericardial effusion. Mosaic attenuation in the lung bases as well as subpleural reticular opacities may reflect a combination of interstitial disease and small airways disease or air trapping. No pleural effusion. Hepatobiliary: Diffuse hepatic hypoattenuation compatible with hepatic steatosis with a geographic hyperattenuating region adjacent the gallbladder fossa, possibly reflective of some sparing or transient hepatic attenuation difference. No concerning focal liver lesion. Gallbladder contains several small gallstones. No pericholecystic fluid or inflammation. No biliary ductal dilatation or intraductal gallstones. Pancreas: Moderate pancreatic atrophy. No pancreatic ductal dilatation or surrounding inflammatory changes. Spleen: Normal in size. No concerning splenic lesions. Adrenals/Urinary Tract: Normal adrenal glands. Kidneys are normally located with symmetric enhancement and excretion. No suspicious renal lesion, urolithiasis or hydronephrosis. Normal bladder. Stomach/Bowel: Small sliding-type hiatal  hernia. Distal stomach and duodenum are unremarkable. No small bowel thickening or dilatation. A normal appendix is visualized. No colonic dilatation or wall thickening. Vascular/Lymphatic: Atherosclerotic calcifications within the abdominal aorta and branch vessels. No aneurysm or ectasia. No enlarged abdominopelvic lymph nodes. Reproductive: Slightly retroverted uterus. No concerning adnexal lesions. Other: No abdominopelvic free fluid or free gas. No bowel containing hernias. Musculoskeletal: Acute to subacute appearing anterior wedging compression deformity T12 with a to 40% height loss fracture line may propagate into the posterior vertebral body cortex but without discernible transosseous extension into the posterior tension band. No other acute fractures are seen. Mixed sclerotic and lucent changes are present L2-L4 centered upon focal discogenic changes at a region of maximal levocurvature in the spine. These are favored to reflect Modic type endplate changes. Sclerotic features isolated to the iliac side of the bilateral SI joints may reflect sequela of prior osteitis condensans. Findings on a background of moderate bilateral SI joint and bilateral hip osteoarthrosis including chondrocalcinosis which can reflect CPPD. IMPRESSION: 1. Acute to subacute appearing anterior wedging compression deformity T12 with a to 40% height loss anteriorly. Fracture line may propagate into the posterior vertebral body cortex but without discernible transosseous extension into the posterior tension band. 2. Cholelithiasis without evidence of acute cholecystitis. 3. Small sliding-type hiatal hernia. 4. Mosaic attenuation in the lung bases as well as subpleural reticular opacities may reflect a combination of interstitial disease and small airways disease or air trapping. Consider further outpatient characterization with HRCT, as clinically warranted. 5. Aortic Atherosclerosis (ICD10-I70.0). Coronary artery atherosclerosis.  Electronically Signed   By: PLovena LeM.D.   On: 02/12/2020 16:01   DG Chest Port 1 View  Result Date: 02/12/2020 CLINICAL DATA:  Shortness of breath. EXAM: PORTABLE CHEST 1 VIEW COMPARISON:  March 15, 2013 FINDINGS: Cardiomegaly. The hila and mediastinum are unremarkable. No pneumothorax. Increased interstitial opacities, new in the interval. No other acute abnormalities. IMPRESSION: Findings are most suggestive of cardiomegaly and mild edema. Electronically  Signed   By: Dorise Bullion III M.D   On: 02/12/2020 12:39

## 2020-02-13 NOTE — Progress Notes (Signed)
IR consulted by Dr. Ophelia Charter for possible image-guided T12 kyphoplasty/vertebroplasty.  Case/images reviewed by Dr. Corliss Skains who recommends MR lumbar spine (T11 to S1) for further evaluation prior to approving procedure- MR ordered, IR will review and make further recommendations following this.  Please call IR with questions/concerns.   Waylan Boga Adrina Armijo, PA-C 02/13/2020, 9:13 AM

## 2020-02-13 NOTE — Progress Notes (Signed)
  Echocardiogram 2D Echocardiogram has been performed.  Kristin Cook 02/13/2020, 3:10 PM

## 2020-02-13 NOTE — Progress Notes (Addendum)
IR consulted by Dr. Ophelia Charter for possible image-guided T12 kyphoplasty/vertebroplasty.  MR lumbar spine reviewed by Dr. Corliss Skains who approves patient for an image-guided T12 KP/VP, however insurance still pending at this time. Will make patient NPO in anticipation for possible procedure tomorrow pending insurance approval. Will hold today's dose of Lovenox as well. Formal consult to follow insurance approval. Dr. Randol Kern made aware.  IR to follow.   Waylan Boga Detta Mellin, PA-C 02/13/2020, 2:09 PM

## 2020-02-13 NOTE — ED Notes (Signed)
Jasmine December (daughter in law) would like an update. [phone]. This RN updated her but would like physician to call with update when available.

## 2020-02-13 NOTE — ED Notes (Signed)
Dinner Trays Ordered @ 1705. 

## 2020-02-13 NOTE — ED Notes (Signed)
Pt transported to MRI 

## 2020-02-13 NOTE — ED Notes (Signed)
Ortho tech at bedside 

## 2020-02-13 NOTE — ED Notes (Signed)
Lunch Tray Ordered @ 1039. 

## 2020-02-13 NOTE — ED Notes (Signed)
MD at bedside. 

## 2020-02-13 NOTE — ED Notes (Signed)
Echo at bedside

## 2020-02-13 NOTE — ED Notes (Signed)
PT at bedside.

## 2020-02-13 NOTE — Progress Notes (Signed)
Bilateral lower extremity venous study completed.   Results given to patients RN.   Please see CV Proc for preliminary results.   Clint Guy, RVT

## 2020-02-13 NOTE — ED Notes (Signed)
Pt.was out of bed wet linen on the floor the patient, acting very confused.x3

## 2020-02-13 NOTE — ED Notes (Signed)
Patient noted taking self off monitor. Patient states "i'm just playing around." Patient placed back on cardiac monitoring. Blankets provided. Lights turned down. Patient updated on plan of care. Call bell in reach. Will continue to monitor.

## 2020-02-14 ENCOUNTER — Inpatient Hospital Stay (HOSPITAL_COMMUNITY): Payer: Medicare Other

## 2020-02-14 DIAGNOSIS — E118 Type 2 diabetes mellitus with unspecified complications: Secondary | ICD-10-CM

## 2020-02-14 DIAGNOSIS — I2721 Secondary pulmonary arterial hypertension: Secondary | ICD-10-CM

## 2020-02-14 HISTORY — PX: IR KYPHO LUMBAR INC FX REDUCE BONE BX UNI/BIL CANNULATION INC/IMAGING: IMG5519

## 2020-02-14 LAB — CBC WITH DIFFERENTIAL/PLATELET
Abs Immature Granulocytes: 0.05 10*3/uL (ref 0.00–0.07)
Basophils Absolute: 0 10*3/uL (ref 0.0–0.1)
Basophils Relative: 0 %
Eosinophils Absolute: 0 10*3/uL (ref 0.0–0.5)
Eosinophils Relative: 0 %
HCT: 43.3 % (ref 36.0–46.0)
Hemoglobin: 14.3 g/dL (ref 12.0–15.0)
Immature Granulocytes: 1 %
Lymphocytes Relative: 14 %
Lymphs Abs: 1.4 10*3/uL (ref 0.7–4.0)
MCH: 29.8 pg (ref 26.0–34.0)
MCHC: 33 g/dL (ref 30.0–36.0)
MCV: 90.2 fL (ref 80.0–100.0)
Monocytes Absolute: 0.8 10*3/uL (ref 0.1–1.0)
Monocytes Relative: 8 %
Neutro Abs: 7.7 10*3/uL (ref 1.7–7.7)
Neutrophils Relative %: 77 %
Platelets: 299 10*3/uL (ref 150–400)
RBC: 4.8 MIL/uL (ref 3.87–5.11)
RDW: 12.6 % (ref 11.5–15.5)
WBC: 9.9 10*3/uL (ref 4.0–10.5)
nRBC: 0 % (ref 0.0–0.2)

## 2020-02-14 LAB — COMPREHENSIVE METABOLIC PANEL
ALT: 12 U/L (ref 0–44)
AST: 13 U/L — ABNORMAL LOW (ref 15–41)
Albumin: 3.1 g/dL — ABNORMAL LOW (ref 3.5–5.0)
Alkaline Phosphatase: 67 U/L (ref 38–126)
Anion gap: 16 — ABNORMAL HIGH (ref 5–15)
BUN: 27 mg/dL — ABNORMAL HIGH (ref 8–23)
CO2: 22 mmol/L (ref 22–32)
Calcium: 9.6 mg/dL (ref 8.9–10.3)
Chloride: 99 mmol/L (ref 98–111)
Creatinine, Ser: 0.71 mg/dL (ref 0.44–1.00)
GFR, Estimated: >60 mL/min (ref >60)
Glucose, Bld: 178 mg/dL — ABNORMAL HIGH (ref 70–99)
Potassium: 3.1 mmol/L — ABNORMAL LOW (ref 3.5–5.1)
Sodium: 137 mmol/L (ref 135–145)
Total Bilirubin: 1 mg/dL (ref 0.3–1.2)
Total Protein: 6.1 g/dL — ABNORMAL LOW (ref 6.5–8.1)

## 2020-02-14 LAB — CBG MONITORING, ED
Glucose-Capillary: 179 mg/dL — ABNORMAL HIGH (ref 70–99)
Glucose-Capillary: 148 mg/dL — ABNORMAL HIGH (ref 70–99)

## 2020-02-14 LAB — C-REACTIVE PROTEIN: CRP: 0.7 mg/dL (ref <1.0)

## 2020-02-14 LAB — D-DIMER, QUANTITATIVE: D-Dimer, Quant: 1.12 ug/mL-FEU — ABNORMAL HIGH (ref 0.00–0.50)

## 2020-02-14 LAB — PROTIME-INR
INR: 1.1 (ref 0.8–1.2)
Prothrombin Time: 13.9 seconds (ref 11.4–15.2)

## 2020-02-14 LAB — MAGNESIUM: Magnesium: 1.9 mg/dL (ref 1.7–2.4)

## 2020-02-14 LAB — GLUCOSE, CAPILLARY
Glucose-Capillary: 249 mg/dL — ABNORMAL HIGH (ref 70–99)
Glucose-Capillary: 352 mg/dL — ABNORMAL HIGH (ref 70–99)

## 2020-02-14 LAB — PHOSPHORUS: Phosphorus: 4.1 mg/dL (ref 2.5–4.6)

## 2020-02-14 MED ORDER — BUPIVACAINE HCL (PF) 0.5 % IJ SOLN
INTRAMUSCULAR | Status: AC
  Filled 2020-02-14: qty 30

## 2020-02-14 MED ORDER — VANCOMYCIN HCL IN DEXTROSE 1-5 GM/200ML-% IV SOLN
INTRAVENOUS | Status: AC
  Administered 2020-02-14: 1000 mg via INTRAVENOUS
  Filled 2020-02-14: qty 200

## 2020-02-14 MED ORDER — TOBRAMYCIN SULFATE 1.2 G IJ SOLR
INTRAMUSCULAR | Status: AC
  Filled 2020-02-14: qty 1.2

## 2020-02-14 MED ORDER — SODIUM CHLORIDE 0.9 % IV SOLN: INTRAVENOUS | Status: DC

## 2020-02-14 MED ORDER — GELATIN ABSORBABLE 12-7 MM EX MISC
CUTANEOUS | Status: AC
  Filled 2020-02-14: qty 1

## 2020-02-14 MED ORDER — ENOXAPARIN SODIUM 40 MG/0.4ML ~~LOC~~ SOLN
40.0000 mg | Freq: Every day | SUBCUTANEOUS | Status: DC
  Administered 2020-02-15 – 2020-02-18 (×4): 40 mg via SUBCUTANEOUS
  Filled 2020-02-14 (×4): qty 0.4

## 2020-02-14 MED ORDER — FENTANYL CITRATE (PF) 100 MCG/2ML IJ SOLN
INTRAMUSCULAR | Status: AC | PRN
Start: 2020-02-14 — End: 2020-02-14
  Administered 2020-02-14: 12.5 ug via INTRAVENOUS
  Administered 2020-02-14: 25 ug via INTRAVENOUS
  Administered 2020-02-14: 12.5 ug via INTRAVENOUS

## 2020-02-14 MED ORDER — BUPIVACAINE HCL (PF) 0.5 % IJ SOLN
INTRAMUSCULAR | Status: AC | PRN
  Administered 2020-02-14: 10 mL

## 2020-02-14 MED ORDER — FENTANYL CITRATE (PF) 100 MCG/2ML IJ SOLN
INTRAMUSCULAR | Status: AC
  Filled 2020-02-14: qty 4

## 2020-02-14 MED ORDER — IOHEXOL 300 MG/ML  SOLN
50.0000 mL | Freq: Once | INTRAMUSCULAR | Status: AC | PRN
  Administered 2020-02-14: 3 mL via INTRA_ARTERIAL

## 2020-02-14 MED ORDER — VANCOMYCIN HCL IN DEXTROSE 1-5 GM/200ML-% IV SOLN: 1000.0000 mg | Freq: Once | INTRAVENOUS | Status: AC

## 2020-02-14 MED ORDER — MIDAZOLAM HCL 2 MG/2ML IJ SOLN
INTRAMUSCULAR | Status: AC
  Filled 2020-02-14: qty 4

## 2020-02-14 MED ORDER — POTASSIUM CHLORIDE CRYS ER 20 MEQ PO TBCR
40.0000 meq | EXTENDED_RELEASE_TABLET | Freq: Four times a day (QID) | ORAL | Status: AC
  Administered 2020-02-14: 40 meq via ORAL
  Filled 2020-02-14: qty 2

## 2020-02-14 MED ORDER — MIDAZOLAM HCL 2 MG/2ML IJ SOLN
INTRAMUSCULAR | Status: AC | PRN
  Administered 2020-02-14: 0.5 mg via INTRAVENOUS
  Administered 2020-02-14: 1 mg via INTRAVENOUS

## 2020-02-14 NOTE — Progress Notes (Addendum)
PROGRESS NOTE                                                                             PROGRESS NOTE                                                                                                                                                                                                             Patient Demographics:    Kristin Cook, is a 86 y.o. female, DOB - 11-11-1931, DTO:[id number]  Outpatient Primary MD for the patient is [name], MD    LOS - 2  Admit date - 02/12/2020    Chief Complaint  Patient presents with  . Back Pain       Brief Narrative    86 y.o. female with medical history significant of HTN; HLD; DM; and dementia presenting with back pain.  She was having back pain for several days, she couldn't get out of the chair prompted ED.  No h/o similar.  She denies falling.  She did complain that her feet were numb.  She denies SOB.  No CP.  Some mild LE edema recently. ED Course:  Back pain today, unable to get up, no apparent injury.  CT with T12 compression fracture.  Dr. Reatha Armour consulted, recommends TLSO, PT/OT (not planning to see since no neuro deficits).  Also with SOB, hs a smoker, sats 80-90s, on 2L.  CXR with ?edema.  CT abdomen with ?ILD.  Given low-dose Lasix.    As well she came positive for COVID-19, she is fully vaccinated.   Subjective:    Kristin Cook today is awake, alert and appropriate, he reports she is feeling much better this morning, denies any cough or shortness of breath reports her lower back pain is controlled .    Assessment  & Plan :    Principal Problem:   Compression fracture of lumbar spine, non-traumatic, initial encounter [facility]) Active Problems:   Controlled diabetes mellitus type 2 with complications ([place])   Hypertension associated with diabetes ([place])  Hyperlipidemia associated with type 2 diabetes mellitus ([facility])   Mild cognitive impairment with memory loss   Hypoxia    COVID-19 virus infection   Compression fracture -Patient without apparent fall, presenting with lumbar compression fracture -Interventional neuroradiology input greatly appreciated, plan for kyphoplasty today . -Holding o Lovenox DVT prophylaxis dose,  it can be resumed resume tomorrow morning . -Admitting physician discussed with neurosurgery on-call, recommendation for TLSO. -We will start on calcium and vitamin D.  Hypoxia -Patient without known h/o hypoxia or c/o SOB, but found to be hypoxic while in the ER to as low as 83% -She is a long-term smoker but does not have known COPD -She has been in pain for the last 3 days and so splinting is a consideration -Imaging significant for interstitial lung disease, with a groundglass opacity, have discussed with pulmonary CT findings, findings more likely due to chronic lung disease either ILD, less likely COPD, much of acute inflammation at this point related to Covid, but steroids should be helpful either in Covid or interstitial lung disease, she will need outpatient work-up once acute event has resolved to evaluate for interstitial lung disease with high resolution CT chest , -She was encouraged to use incentive spirometer, . -Wean oxygen as tolerated.  COVID-19 infection -Patient is fully vaccinated, she tested positive for COVID-19 on screening, she is hypoxic, with some abnormal finding on imaging, unclear if this would be related to volume overload/ILD versus Covid, so she was empirically started on steroids and Remdesivir. -Continue to trend inflammatory markers, D-dimers most likely elevated in the setting of inflammation due to Covid, venous  Dopplers are negative for DVT, D-dimer trending down.  Acute metabolic encephalopathy/delirium -Cognition may also be impacted by hearing loss, son reports mother lives with them but still driving , there is some mild decline in cognition for last 6 months, but she does not appear to be having  significant or profound dementia, I think had more confusion and restlessness today is more related to her lower back pain and hospital delirium. -Morning she is awake, alert and appropriate and coherent.   Pulmonary arterial hypertension -2D echo with evidence of moderately enlarged ventricle, with severely elevated pulmonary artery systolic pressure, with estimated right ventricular systolic pressure at 65, this is almost likely related to undiagnosed underlying lung disease causing secondary pulmonary arterial hypertension, she will need further work-up for her undiagnosed lung disease as an outpatient, can follow with pulmonary and cardiology as an outpatient.  HTN -Controlled today, continue with current regimen  HLD -Continue Crestor  Hypokalemia - repleted  DM -Recent A1c indicates very poor control -hold Actoplus Met -Cover with moderate-scale SSI  Tobacco dependence -Encourage cessation.   -Patch ordered    SpO2: 100 % O2 Flow Rate (L/min): 2 L/min  Recent Labs  Lab 02/12/20 1208 02/12/20 2030 02/13/20 0537 02/14/20 0838  WBC 6.3  --  6.3 9.9  PLT 221  --  248 299  CRP  --   --  1.5* 0.7  BNP 234.2*  --   --   --   DDIMER  --   --  1.73* 1.12*  AST 13*  --  14* 13*  ALT 14  --  14 12  ALKPHOS 82  --  87 67  BILITOT 1.0  --  0.9 1.0  ALBUMIN 3.4*  --  3.4* 3.1*  SARSCOV2NAA  --  POSITIVE*  --   --        ABG  No results  found for: PHART, PCO2ART, PO2ART, HCO3, TCO2, ACIDBASEDEF, O2SAT          Condition - Extremely Guarded  Family Communication  : Discussed with son [name] by phone 12/6, and on 12/[phone]), left daughter-in-law Orest Dikes 12/7  Code Status :  DNR  Consults  :  IR, neurosurgery by phone.  Procedures  :  none  Disposition Plan  :    Status is: Inpatient  Remains inpatient appropriate because:IV treatments appropriate due to intensity of illness or inability to take PO   Dispo: The patient is from:  Home              Anticipated d/c is to: SNF              Anticipated d/c date is: 2 days              Patient currently is not medically stable to d/c.      DVT Prophylaxis  :  Lovenox hold today for kyphoplasty, to be resumed tomorrow.  Lab Results  Component Value Date   PLT 299 02/14/2020    Diet :  Diet Order            Diet NPO time specified Except for: Sips with Meds  Diet effective midnight                  Inpatient Medications  Scheduled Meds: . albuterol  2 puff Inhalation Q6H  . amLODipine  10 mg Oral Daily  . vitamin C  500 mg Oral Daily  . calcium-vitamin D  1 tablet Oral BID  . dexamethasone (DECADRON) injection  6 mg Intravenous Daily  . docusate sodium  100 mg Oral BID  . lisinopril  20 mg Oral Daily   And  . hydrochlorothiazide  12.5 mg Oral Daily  . insulin aspart  0-15 Units Subcutaneous TID WC  . insulin aspart  0-5 Units Subcutaneous QHS  . lidocaine  1 patch Transdermal Q24H  . nicotine  14 mg Transdermal Daily  . rosuvastatin  20 mg Oral Daily  . senna-docusate  2 tablet Oral BID  . sodium chloride flush  3 mL Intravenous Q12H  . zinc sulfate  220 mg Oral Daily   Continuous Infusions: . sodium chloride 75 mL/hr at 02/14/20 0913  . remdesivir 100 mg in NS 100 mL Stopped (02/14/20 1011)  . vancomycin     PRN Meds:.acetaminophen **OR** acetaminophen, bisacodyl, guaiFENesin-dextromethorphan, hydrALAZINE, morphine injection, ondansetron **OR** ondansetron (ZOFRAN) IV, oxyCODONE, polyethylene glycol  Antibiotics  :    Anti-infectives (From admission, onward)   Start     Dose/Rate Route Frequency Ordered Stop   02/14/20 1415  vancomycin (VANCOCIN) IVPB 1000 mg/200 mL premix        1,000 mg 200 mL/hr over 60 Minutes Intravenous  Once 02/14/20 1406     02/13/20 1000  remdesivir 100 mg in sodium chloride 0.9 % 100 mL IVPB       "Followed by" Linked Group Details   100 mg 200 mL/hr over 30 Minutes Intravenous Daily 02/12/20 2243 02/17/20  0959   02/12/20 2330  remdesivir 200 mg in sodium chloride 0.9% 250 mL IVPB       "Followed by" Linked Group Details   200 mg 580 mL/hr over 30 Minutes Intravenous Once 02/12/20 2243 02/13/20 0105       Skyann Ganim M.D on 02/14/2020 at 2:17 PM  To page go to [email or web address]   [facility] -  Office  [phone]   See all Orders from today for further details    Objective:   Vitals:   02/14/20 0915 02/14/20 1000 02/14/20 1200 02/14/20 1300  BP:  (!) 163/68 (!) 151/76 (!) 156/97  Pulse: 68 64 73 75  Resp: (!) _0 (!) 21  Temp:      TempSrc:      SpO2: 100% 100% 100% 100%    Wt Readings from Last 3 Encounters:  01/18/20 62.9 kg  09/13/19 63.4 kg  05/26/19 64.8 kg    No intake or output data in the 24 hours ending 02/14/20 1417   Physical Exam  Awake Alert, Oriented X 3, is appropriate, coherent and pleasant today Symmetrical Chest wall movement, Good air movement bilaterally, CTAB RRR,No Gallops,Rubs or new Murmurs, No Parasternal Heave +ve B.Sounds, Abd Soft, No tenderness, No rebound - guarding or rigidity. No Cyanosis, Clubbing or edema, No new Rash or bruise      Data Review:    CBC Recent Labs  Lab 02/12/20 1208 02/13/20 0537 02/14/20 0838  WBC 6.3 6.3 9.9  HGB 13.7 15.0 14.3  HCT 41.6 45.9 43.3  PLT 221 248 299  MCV 89.8 89.8 90.2  MCH 29.6 29.4 29.8  MCHC 32.9 32.7 33.0  RDW 12.9 12.8 12.6  LYMPHSABS 0.6* 0.5* 1.4  MONOABS 0.6 0.2 0.8  EOSABS 0.1 0.0 0.0  BASOSABS 0.0 0.0 0.0    Recent Labs  Lab 02/12/20 1208 02/13/20 0537 02/14/20 0838  NA 134* 135 137  K 4.1 4.1 3.1*  CL 99 96* 99  CO2 _1 GLUCOSE 246* 257* 178*  BUN 12 18 27*  CREATININE 0.80 0.99 0.71  CALCIUM 8.8* 8.8* 9.6  AST 13* 14* 13*  ALT _2 ALKPHOS 82 87 67  BILITOT 1.0 0.9 1.0  ALBUMIN 3.4* 3.4* 3.1*  MG  --  1.7 1.9  CRP  --  1.5* 0.7  DDIMER  --  1.73* 1.12*  BNP 234.2*  --   --      ------------------------------------------------------------------------------------------------------------------ No results for input(s): CHOL, HDL, LDLCALC, TRIG, CHOLHDL, LDLDIRECT in the last 72 hours.  Lab Results  Component Value Date   HGBA1C 9.0 (A) 01/18/2020   ------------------------------------------------------------------------------------------------------------------ No results for input(s): TSH, T4TOTAL, T3FREE, THYROIDAB in the last 72 hours.  Invalid input(s): FREET3  Cardiac Enzymes No results for input(s): CKMB, TROPONINI, MYOGLOBIN in the last 168 hours.  Invalid input(s): CK ------------------------------------------------------------------------------------------------------------------    Component Value Date/Time   BNP 234.2 (H) 02/12/2020 1208    Micro Results Recent Results (from the past 240 hour(s))  Resp Panel by RT-PCR (Flu A&B, Covid) Nasopharyngeal Swab     Status: Abnormal   Collection Time: 02/12/20  8:30 PM   Specimen: Nasopharyngeal Swab; Nasopharyngeal(NP) swabs in vial transport medium  Result Value Ref Range Status   SARS Coronavirus 2 by RT PCR POSITIVE (A) NEGATIVE Final    Comment: RESULT CALLED TO, READ BACK BY AND VERIFIED WITH: [place] 2203 02/12/2020 T. TYSOR (NOTE) SARS-CoV-2 target nucleic acids are DETECTED.  The SARS-CoV-2 RNA is generally detectable in upper respiratory specimens during the acute phase of infection. Positive results are indicative of the presence of the identified virus, but do not rule out bacterial infection or co-infection with other pathogens not detected by the test. Clinical correlation with patient history and other diagnostic information is necessary to determine patient infection status. The expected result is Negative.  Fact Sheet for Patients: EntrepreneurPulse.com.au  Fact  Sheet for Healthcare Providers: IncredibleEmployment.be  This test is  not yet approved or cleared by the [place] and  has been authorized for detection and/or diagnosis of SARS-CoV-2 by FDA under an Emergency Use Authorization (EUA).  This EUA will remain in effect (meaning this test c an be used) for the duration of  the COVID-19 declaration under Section 564(b)(1) of the Act, 21 U.S.C. section 360bbb-3(b)(1), unless the authorization is terminated or revoked sooner.     Influenza A by PCR NEGATIVE NEGATIVE Final   Influenza B by PCR NEGATIVE NEGATIVE Final    Comment: (NOTE) The Xpert Xpress SARS-CoV-2/FLU/RSV plus assay is intended as an aid in the diagnosis of influenza from Nasopharyngeal swab specimens and should not be used as a sole basis for treatment. Nasal washings and aspirates are unacceptable for Xpert Xpress SARS-CoV-2/FLU/RSV testing.  Fact Sheet for Patients: EntrepreneurPulse.com.au  Fact Sheet for Healthcare Providers: IncredibleEmployment.be  This test is not yet approved or cleared by the [place] FDA and has been authorized for detection and/or diagnosis of SARS-CoV-2 by FDA under an Emergency Use Authorization (EUA). This EUA will remain in effect (meaning this test can be used) for the duration of the COVID-19 declaration under Section 564(b)(1) of the Act, 21 U.S.C. section 360bbb-3(b)(1), unless the authorization is terminated or revoked.  Performed at [facility], [place] [address], [place],  [address]     Radiology Reports MR LUMBAR SPINE WO CONTRAST  Result Date: 02/13/2020 CLINICAL DATA:  Acute presentation with back pain over the last few days. EXAM: MRI LUMBAR SPINE WITHOUT CONTRAST TECHNIQUE: Multiplanar, multisequence MR imaging of the lumbar spine was performed. No intravenous contrast was administered. COMPARISON:  CT abdomen yesterday. FINDINGS: Segmentation:  5 lumbar type vertebral bodies. Alignment: Thoracolumbar curvature convex to the right and  lumbar curvature convex to the left. Vertebrae: Acute or subacute superior endplate fracture at Y09 with loss of height of 10%. No retropulsed bone. Discogenic degenerative endplate changes on the right at L2-3 and L3-4. Conus medullaris and cauda equina: Conus extends to the L1-2 level. Conus and cauda equina appear normal. Paraspinal and other soft tissues: Negative Disc levels: L1-2: Minimal disc bulge.  No stenosis or neural compression. L2-3: Disc degeneration more pronounced on the right. Endplate osteophytes and mild bulging of the disc. No compressive canal or foraminal narrowing. L3-4: Disc degeneration more pronounced on the right. Endplate osteophytes and shallow protrusion of the disc. Facet and ligamentous hypertrophy more prominent on the right. Mild stenosis of the right lateral recess and intervertebral foramen on the right that could possibly cause right-sided neural compressive symptoms. L4-5: Mild bulging of the disc.  No stenosis. L5-S1: Minimal bulging of the disc.  No stenosis. IMPRESSION: 1. Acute or subacute superior endplate fracture at X83 with loss of height of 10%. No retropulsed bone. This looks like a benign osteoporotic fracture. 2. Thoracolumbar curvature convex to the right and lumbar curvature convex to the left. 3. Right-sided predominant degenerative changes at L2-3 and L3-4 with right lateral recess and foraminal narrowing that could possibly cause right-sided neural compressive symptoms at the L3-4 level. Electronically Signed   By: Nelson Chimes M.D.   On: 02/13/2020 14:06   CT ABDOMEN PELVIS W CONTRAST  Result Date: 02/12/2020 CLINICAL DATA:  Four days of worsening lower back pain EXAM: CT ABDOMEN AND PELVIS WITH CONTRAST TECHNIQUE: Multidetector CT imaging of the abdomen and pelvis was performed using the standard protocol following bolus administration of intravenous contrast.  CONTRAST:  167m OMNIPAQUE IOHEXOL 300 MG/ML  SOLN COMPARISON:  None. FINDINGS: Lower chest:  Coronary artery atherosclerosis. Calcification of the mitral annulus. Cardiac size within normal limits. No pericardial effusion. Mosaic attenuation in the lung bases as well as subpleural reticular opacities may reflect a combination of interstitial disease and small airways disease or air trapping. No pleural effusion. Hepatobiliary: Diffuse hepatic hypoattenuation compatible with hepatic steatosis with a geographic hyperattenuating region adjacent the gallbladder fossa, possibly reflective of some sparing or transient hepatic attenuation difference. No concerning focal liver lesion. Gallbladder contains several small gallstones. No pericholecystic fluid or inflammation. No biliary ductal dilatation or intraductal gallstones. Pancreas: Moderate pancreatic atrophy. No pancreatic ductal dilatation or surrounding inflammatory changes. Spleen: Normal in size. No concerning splenic lesions. Adrenals/Urinary Tract: Normal adrenal glands. Kidneys are normally located with symmetric enhancement and excretion. No suspicious renal lesion, urolithiasis or hydronephrosis. Normal bladder. Stomach/Bowel: Small sliding-type hiatal hernia. Distal stomach and duodenum are unremarkable. No small bowel thickening or dilatation. A normal appendix is visualized. No colonic dilatation or wall thickening. Vascular/Lymphatic: Atherosclerotic calcifications within the abdominal aorta and branch vessels. No aneurysm or ectasia. No enlarged abdominopelvic lymph nodes. Reproductive: Slightly retroverted uterus. No concerning adnexal lesions. Other: No abdominopelvic free fluid or free gas. No bowel containing hernias. Musculoskeletal: Acute to subacute appearing anterior wedging compression deformity T12 with a to 40% height loss fracture line may propagate into the posterior vertebral body cortex but without discernible transosseous extension into the posterior tension band. No other acute fractures are seen. Mixed sclerotic and lucent  changes are present L2-L4 centered upon focal discogenic changes at a region of maximal levocurvature in the spine. These are favored to reflect Modic type endplate changes. Sclerotic features isolated to the iliac side of the bilateral SI joints may reflect sequela of prior osteitis condensans. Findings on a background of moderate bilateral SI joint and bilateral hip osteoarthrosis including chondrocalcinosis which can reflect CPPD. IMPRESSION: 1. Acute to subacute appearing anterior wedging compression deformity T12 with a to 40% height loss anteriorly. Fracture line may propagate into the posterior vertebral body cortex but without discernible transosseous extension into the posterior tension band. 2. Cholelithiasis without evidence of acute cholecystitis. 3. Small sliding-type hiatal hernia. 4. Mosaic attenuation in the lung bases as well as subpleural reticular opacities may reflect a combination of interstitial disease and small airways disease or air trapping. Consider further outpatient characterization with HRCT, as clinically warranted. 5. Aortic Atherosclerosis (ICD10-I70.0). Coronary artery atherosclerosis. Electronically Signed   By: PLovena LeM.D.   On: 02/12/2020 16:01   DG Chest Port 1 View  Result Date: 02/12/2020 CLINICAL DATA:  Shortness of breath. EXAM: PORTABLE CHEST 1 VIEW COMPARISON:  March 15, 2013 FINDINGS: Cardiomegaly. The hila and mediastinum are unremarkable. No pneumothorax. Increased interstitial opacities, new in the interval. No other acute abnormalities. IMPRESSION: Findings are most suggestive of cardiomegaly and mild edema. Electronically Signed   By: DDorise BullionIII M.D   On: 02/12/2020 12:39   VAS [place]OWER EXTREMITY VENOUS (DVT)  Result Date: 02/13/2020  Lower Venous DVT Study Other Indications: Covid-19, Elevated D-Dimer. Comparison Study: No previous Venous duplex. Performing Technologist: LVonzell Schlatter Examination Guidelines: A complete evaluation includes  B-mode imaging, spectral Doppler, color Doppler, and power Doppler as needed of all accessible portions of each vessel. Bilateral testing is considered an integral part of a complete examination. Limited examinations for reoccurring indications may be performed as noted. The reflux portion of the exam is performed with  the patient in reverse Trendelenburg.  +---------+---------------+---------+-----------+----------+--------------+ RIGHT    CompressibilityPhasicitySpontaneityPropertiesThrombus Aging +---------+---------------+---------+-----------+----------+--------------+ CFV      Full           Yes      Yes                                 +---------+---------------+---------+-----------+----------+--------------+ SFJ      Full                                                        +---------+---------------+---------+-----------+----------+--------------+ FV Prox  Full                                                        +---------+---------------+---------+-----------+----------+--------------+ FV Mid   Full                                                        +---------+---------------+---------+-----------+----------+--------------+ FV DistalFull                                                        +---------+---------------+---------+-----------+----------+--------------+ PFV      Full                                                        +---------+---------------+---------+-----------+----------+--------------+ POP      Full           Yes      Yes                                 +---------+---------------+---------+-----------+----------+--------------+ PTV      Full                                                        +---------+---------------+---------+-----------+----------+--------------+ PERO     Full                                                         +---------+---------------+---------+-----------+----------+--------------+   +---------+---------------+---------+-----------+----------+--------------+ LEFT     CompressibilityPhasicitySpontaneityPropertiesThrombus Aging +---------+---------------+---------+-----------+----------+--------------+ CFV      Full           Yes      Yes                                 +---------+---------------+---------+-----------+----------+--------------+  SFJ      Full                                                        +---------+---------------+---------+-----------+----------+--------------+ FV Prox  Full                                                        +---------+---------------+---------+-----------+----------+--------------+ FV Mid   Full                                                        +---------+---------------+---------+-----------+----------+--------------+ FV DistalFull                                                        +---------+---------------+---------+-----------+----------+--------------+ PFV      Full                                                        +---------+---------------+---------+-----------+----------+--------------+ POP      Full           Yes      Yes                                 +---------+---------------+---------+-----------+----------+--------------+ PTV      Full                                                        +---------+---------------+---------+-----------+----------+--------------+ PERO     Full                                                        +---------+---------------+---------+-----------+----------+--------------+     Summary: RIGHT: - There is no evidence of deep vein thrombosis in the lower extremity.  - No cystic structure found in the popliteal fossa.  LEFT: - There is no evidence of deep vein thrombosis in the lower extremity.  - No cystic structure found in the popliteal fossa.   *See table(s) above for measurements and observations. Electronically signed by Ruta Hinds MD on 02/13/2020 at 5:00:16 PM.    Final    ECHOCARDIOGRAM LIMITED  Result Date: 02/13/2020    ECHOCARDIOGRAM LIMITED REPORT   Patient Name:   TOSHIBA NULL Date of Exam: 02/13/2020 Medical Rec #:  [id number]  Height:       61.0 in Accession #:    [id number]       Weight:       138.6 lb Date of Birth:  01-03-1932        BSA:          1.616 m Patient Age:    40 years         BP:           157/74 mmHg Patient Gender: F                HR:           85 bpm. Exam Location:  Inpatient Procedure: Limited Echo, Cardiac Doppler and Color Doppler Indications:    CHF  History:        Patient has no prior history of Echocardiogram examinations.                 Signs/Symptoms:Dementia; Risk Factors:Hypertension,                 Dyslipidemia, Diabetes and LE edema. T12 compression fracture,                 COVID+.  Sonographer:    Dustin Flock Referring Phys: [place]  Sonographer Comments: Back fracture. IMPRESSIONS  1. Left ventricular ejection fraction, by estimation, is 65 to 70%. The left ventricle has normal function. The left ventricle has no regional wall motion abnormalities. There is moderate concentric left ventricular hypertrophy. Left ventricular diastolic function could not be evaluated. There is the interventricular septum is flattened in systole, consistent with right ventricular pressure overload.  2. Right ventricular systolic function is normal. The right ventricular size is moderately enlarged. There is severely elevated pulmonary artery systolic pressure. The estimated right ventricular systolic pressure is 31.4 mmHg.  3. The mitral valve is degenerative. Trivial mitral valve regurgitation. Mild mitral stenosis. The mean mitral valve gradient is 5.9 mmHg with average heart rate of 85 bpm. Moderate to severe mitral annular calcification.  4. The tricuspid valve is abnormal. Tricuspid valve  regurgitation is moderate.  5. The aortic valve is tricuspid. There is mild calcification of the aortic valve. There is mild thickening of the aortic valve. Aortic valve regurgitation is not visualized. Mild aortic valve sclerosis is present, with no evidence of aortic valve stenosis.  6. The inferior vena cava is normal in size with <50% respiratory variability, suggesting right atrial pressure of 8 mmHg. Comparison(s): No prior Echocardiogram. Conclusion(s)/Recommendation(s): Normal LV function. Moderately dilated RV with preserved function. RVSP severely elevated ~65 mmHG. Septal flattening is present in systole consistent with RV pressure overload. FINDINGS  Left Ventricle: Left ventricular ejection fraction, by estimation, is 65 to 70%. The left ventricle has normal function. The left ventricle has no regional wall motion abnormalities. The left ventricular internal cavity size was normal in size. There is  moderate concentric left ventricular hypertrophy. The interventricular septum is flattened in systole, consistent with right ventricular pressure overload. Left ventricular diastolic function could not be evaluated. Left ventricular diastolic function could not be evaluated due to mitral annular calcification (moderate or greater). Right Ventricle: The right ventricular size is moderately enlarged. No increase in right ventricular wall thickness. Right ventricular systolic function is normal. There is severely elevated pulmonary artery systolic pressure. The tricuspid regurgitant velocity is 3.79 m/s, and with an assumed right atrial pressure of 8 mmHg, the estimated right ventricular systolic pressure is 97.0 mmHg. Pericardium: Trivial pericardial effusion is  present. Mitral Valve: The mitral valve is degenerative in appearance. There is mild calcification of the anterior and posterior mitral valve leaflet(s). Moderate to severe mitral annular calcification. Trivial mitral valve regurgitation. Mild mitral  valve stenosis. MV peak gradient, 15.4 mmHg. The mean mitral valve gradient is 5.9 mmHg with average heart rate of 85 bpm. Tricuspid Valve: The tricuspid valve is abnormal. Tricuspid valve regurgitation is moderate . No evidence of tricuspid stenosis. Aortic Valve: The aortic valve is tricuspid. There is mild calcification of the aortic valve. There is mild thickening of the aortic valve. Aortic valve regurgitation is not visualized. Mild aortic valve sclerosis is present, with no evidence of aortic valve stenosis. Aorta: The aortic root is normal in size and structure. Venous: The inferior vena cava is normal in size with less than 50% respiratory variability, suggesting right atrial pressure of 8 mmHg. LEFT VENTRICLE PLAX 2D LVIDd:         3.20 cm  Diastology LVIDs:         2.40 cm  LV e' medial:    4.35 cm/s LV PW:         1.40 cm  LV E/e' medial:  21.2 LV IVS:        1.50 cm  LV e' lateral:   3.59 cm/s LVOT diam:     2.00 cm  LV E/e' lateral: 25.7 LV SV:         61 LV SV Index:   38 LVOT Area:     3.14 cm  RIGHT VENTRICLE RV S prime:     7.83 cm/s LEFT ATRIUM         Index LA diam:    3.10 cm 1.92 cm/m  AORTIC VALVE LVOT Vmax:   106.00 cm/s LVOT Vmean:  73.100 cm/s LVOT VTI:    0.194 m  AORTA Ao Root diam: 3.00 cm MITRAL VALVE                TRICUSPID VALVE MV Area (PHT): 3.42 cm     TR Peak grad:   57.5 mmHg MV Area VTI:   1.90 cm     TR Vmax:        379.00 cm/s MV Peak grad:  15.4 mmHg MV Mean grad:  5.9 mmHg     SHUNTS MV Vmax:       1.96 m/s     Systemic VTI:  0.19 m MV Vmean:      109.0 cm/s   Systemic Diam: 2.00 cm MV VTI:        0.32 m MV Decel Time: 222 msec MV E velocity: 92.40 cm/s MV A velocity: 173.00 cm/s MV E/A ratio:  0.53 Eleonore Chiquito MD Electronically signed by Eleonore Chiquito MD Signature Date/Time: 02/13/2020/3:54:22 PM    Final

## 2020-02-14 NOTE — Procedures (Signed)
S/P T 12 balloon KP S.Dorethea Strubel MD 

## 2020-02-14 NOTE — Progress Notes (Signed)
Patient arrived on stretch from ED with transporter.  The transporter stated that he's not allowed to go into the rooms.  Paged for help to move patient from stretcher to bed.  Patient is clean, dry and all skin intact except for T12 kyphoplasty site.  Gauze with tegaderm over site, with small amount of sanguineous exudate.  Titrated O2 to 1L and pt. Oxygen sats in mid 90%.  Patient denies pain.  Educated her on use of call bell.  She is resting comfortably in bed with call bell in reach.

## 2020-02-14 NOTE — TOC Initial Note (Signed)
Transition of Care [facility]) - Initial/Assessment Note    Patient Details  Name: Kristin Cook MRN: [id number] Date of Birth: January 23, 1932  Transition of Care [facility]) CM/SW Contact:    Inis Sizer, LCSW Phone Number: 02/14/2020, 8:07 AM  Clinical Narrative:                 CSW spoke with patient's son Janit Bern reports the patient lives with his wife Jasmine December in [place]. [name] is agreeable for CSW to begin working on SNF placement for the patient.  Per RN, the patient is scheduled to go to IR to address her back fracture.  Expected Discharge Plan: Skilled Nursing Facility Barriers to Discharge: Continued Medical Work up, English as a second language teacher, SNF Pending bed offer   Patient Goals and CMS Choice   CMS Medicare.gov Compare Post Acute Care list provided to:: Other (Comment Required) (Patient's son [name]) Choice offered to / list presented to : Adult Children  Expected Discharge Plan and Services Expected Discharge Plan: Skilled Nursing Facility                                              Prior Living Arrangements/Services   Lives with:: Adult Children Patient language and need for interpreter reviewed:: Yes Do you feel safe going back to the place where you live?: Yes      Need for Family Participation in Patient Care: Yes (Comment) Care giver support system in place?: Yes (comment)   Criminal Activity/Legal Involvement Pertinent to Current Situation/Hospitalization: No - Comment as needed  Activities of Daily Living      Permission Sought/Granted                  Emotional Assessment Appearance:: Appears stated age Attitude/Demeanor/Rapport: Unable to Assess Affect (typically observed): Unable to Assess Orientation: : Oriented to Place, Oriented to Self, Fluctuating Orientation (Suspected and/or reported Sundowners), Oriented to Situation, Oriented to  Time Alcohol / Substance Use: Not Applicable Psych Involvement: No (comment)  Admission  diagnosis:  Compression fracture of lumbar spine, non-traumatic, initial encounter (HCC) [B84.66ZL] COVID-19 virus infection [U07.1] Patient Active Problem List   Diagnosis Date Noted  . Compression fracture of lumbar spine, non-traumatic, initial encounter (HCC) 02/12/2020  . Hypoxia 02/12/2020  . COVID-19 virus infection 02/12/2020  . Mild cognitive impairment with memory loss 09/21/2018  . Onychomycosis of multiple toenails with type 2 diabetes mellitus (HCC) 10/22/2015  . Presbycusis of both ears 06/19/2015  . Severe nonproliferative diabetic retinopathy of left eye with macular edema (HCC) 12/30/2011  . Arthritis 09/29/2011  . Controlled diabetes mellitus type 2 with complications (HCC) 11/05/2010  . Hypertension associated with diabetes (HCC) 11/05/2010  . Hyperlipidemia associated with type 2 diabetes mellitus (HCC) 11/05/2010   PCP:  Ronnald Nian, MD Pharmacy:   [facility] [phone] Gin[place] Otto, [place] - [address] AT [facility] [address] [facility] [address] [place] [address] Phone: [phone] Fax: [phone]  [facility] [place], [place] - [address] [place], [address] [place], [address] [place] [address] Phone: [phone] Fax: [phone]     Social Determinants of Health (SDOH) Interventions    Readmission Risk Interventions No flowsheet data found.

## 2020-02-14 NOTE — ED Notes (Signed)
Took pt off O2 and tried on room air. Pt's sats dropped to 85%- placed pt back on 2L [place].

## 2020-02-14 NOTE — ED Notes (Signed)
Pt transported to IR. Belongings left in room 45 for pt's return

## 2020-02-14 NOTE — NC FL2 (Addendum)
[place] MEDICAID FL2 LEVEL OF CARE SCREENING TOOL     IDENTIFICATION  Patient Name: Kristin Cook Birthdate: 09-02-1931 Sex: female Admission Date (Current Location): 02/12/2020  [facility] and [place] Number:  Herbalist and Address:  The . [facility], [place] [address] [place], [place] [address]      Provider Number: [id number]  Attending Physician Name and Address:  Elgergawy, Silver Huguenin, MD  Relative Name and Phone Number:       Current Level of Care: Hospital Recommended Level of Care: [place] Prior Approval Number:    Date Approved/Denied: 02/14/20 PASRR Number: [id number]  Discharge Plan: SNF    Current Diagnoses: Patient Active Problem List   Diagnosis Date Noted  . Compression fracture of lumbar spine, non-traumatic, initial encounter ([place]) 02/12/2020  . Hypoxia 02/12/2020  . COVID-19 virus infection 02/12/2020  . Mild cognitive impairment with memory loss 09/21/2018  . Onychomycosis of multiple toenails with type 2 diabetes mellitus ([place]) 10/22/2015  . Presbycusis of both ears 06/19/2015  . Severe nonproliferative diabetic retinopathy of left eye with macular edema ([place]) 12/30/2011  . Arthritis 09/29/2011  . Controlled diabetes mellitus type 2 with complications ([place]) 59/16/3846  . Hypertension associated with diabetes ([place]) 11/05/2010  . Hyperlipidemia associated with type 2 diabetes mellitus ([place]) 11/05/2010    Orientation RESPIRATION BLADDER Height & Weight     Self, Time, Situation, Place (Flucutates on place)  Normal (3L HFNC) External catheter, Continent Weight:   Height:     BEHAVIORAL SYMPTOMS/MOOD NEUROLOGICAL BOWEL NUTRITION STATUS      Continent Diet  AMBULATORY STATUS COMMUNICATION OF NEEDS Skin   Extensive Assist Verbally Normal                       Personal Care Assistance Level of Assistance  Bathing, Feeding, Dressing Bathing Assistance: Maximum assistance Feeding assistance:  Limited assistance Dressing Assistance: Maximum assistance     Functional Limitations Info  Speech, Hearing, Sight Sight Info: Impaired (Glasses) Hearing Info: Adequate Speech Info: Adequate    SPECIAL CARE FACTORS FREQUENCY  PT (By licensed PT), OT (By licensed OT)     PT Frequency: 5x weekly OT Frequency: 5x weekly            Contractures Contractures Info: Not present    Additional Factors Info  Allergies, Code Status Code Status Info: DNR Allergies Info: Pencillin, Sulfa Antibiotics, Sulfamethoxazole           Current Medications (02/14/2020):  This is the current hospital active medication list Current Facility-Administered Medications  Medication Dose Route Frequency Provider Last Rate Last Admin  . 0.9 %  sodium chloride infusion   Intravenous Continuous Elgergawy, Silver Huguenin, MD      . acetaminophen (TYLENOL) tablet 650 mg  650 mg Oral Q6H PRN Karmen Bongo, MD       Or  . acetaminophen (TYLENOL) suppository 650 mg  650 mg Rectal Q6H PRN Karmen Bongo, MD      . albuterol (VENTOLIN HFA) 108 (90 Base) MCG/ACT inhaler 2 puff  2 puff Inhalation Q6H Shalhoub, Sherryll Burger, MD   2 puff at 02/14/20 0756  . amLODipine (NORVASC) tablet 10 mg  10 mg Oral Daily Karmen Bongo, MD   10 mg at 02/13/20 1028  . ascorbic acid (VITAMIN C) tablet 500 mg  500 mg Oral Daily Shalhoub, Sherryll Burger, MD   500 mg at 02/13/20 1030  . bisacodyl (DULCOLAX) EC tablet 5 mg  5 mg Oral Daily PRN Karmen Bongo, MD      . calcium-vitamin D (OSCAL WITH D) 500-200 MG-UNIT per tablet 1 tablet  1 tablet Oral BID Elgergawy, Silver Huguenin, MD   1 tablet at 02/13/20 2302  . dexamethasone (DECADRON) injection 6 mg  6 mg Intravenous Daily Shalhoub, Sherryll Burger, MD   6 mg at 02/13/20 1032  . docusate sodium (COLACE) capsule 100 mg  100 mg Oral BID Karmen Bongo, MD   100 mg at 02/13/20 1030  . guaiFENesin-dextromethorphan (ROBITUSSIN DM) 100-10 MG/5ML syrup 10 mL  10 mL Oral Q4H PRN Shalhoub, Sherryll Burger, MD      .  hydrALAZINE (APRESOLINE) injection 5 mg  5 mg Intravenous Q4H PRN Karmen Bongo, MD      . lisinopril (ZESTRIL) tablet 20 mg  20 mg Oral Daily Karmen Bongo, MD   20 mg at 02/13/20 1029   And  . hydrochlorothiazide (MICROZIDE) capsule 12.5 mg  12.5 mg Oral Daily Karmen Bongo, MD   12.5 mg at 02/13/20 1029  . insulin aspart (novoLOG) injection 0-15 Units  0-15 Units Subcutaneous TID WC Karmen Bongo, MD   3 Units at 02/14/20 0756  . insulin aspart (novoLOG) injection 0-5 Units  0-5 Units Subcutaneous QHS Karmen Bongo, MD   2 Units at 02/12/20 2217  . lidocaine (LIDODERM) 5 % 1 patch  1 patch Transdermal Q24H Karmen Bongo, MD   1 patch at 02/13/20 1237  . morphine 2 MG/ML injection 2 mg  2 mg Intravenous Q2H PRN Karmen Bongo, MD      . nicotine (NICODERM CQ - dosed in mg/24 hours) patch 14 mg  14 mg Transdermal Daily Karmen Bongo, MD      . ondansetron [facility]) tablet 4 mg  4 mg Oral Q6H PRN Karmen Bongo, MD       Or  . ondansetron [facility]) injection 4 mg  4 mg Intravenous Q6H PRN Karmen Bongo, MD      . oxyCODONE (Oxy IR/ROXICODONE) immediate release tablet 5 mg  5 mg Oral Q4H PRN Karmen Bongo, MD      . polyethylene glycol (MIRALAX / GLYCOLAX) packet 17 g  17 g Oral Daily PRN Karmen Bongo, MD      . remdesivir 100 mg in sodium chloride 0.9 % 100 mL IVPB  100 mg Intravenous Daily Shalhoub, Sherryll Burger, MD   Stopped at 02/13/20 1122  . rosuvastatin (CRESTOR) tablet 20 mg  20 mg Oral Daily Karmen Bongo, MD   20 mg at 02/13/20 1029  . senna-docusate (Senokot-S) tablet 2 tablet  2 tablet Oral BID Elgergawy, Silver Huguenin, MD   2 tablet at 02/13/20 2255  . sodium chloride flush (NS) 0.9 % injection 3 mL  3 mL Intravenous Q12H Karmen Bongo, MD   3 mL at 02/13/20 2301  . zinc sulfate capsule 220 mg  220 mg Oral Daily Shalhoub, Sherryll Burger, MD   220 mg at 02/13/20 1030   Current Outpatient Medications  Medication Sig Dispense Refill  . amLODipine (NORVASC) 10 MG tablet TAKE 1  TABLET(10 MG) BY MOUTH DAILY (Patient taking differently: Take 10 mg by mouth daily. ) 90 tablet 3  . lisinopril-hydrochlorothiazide (ZESTORETIC) 20-12.5 MG tablet Take 1 tablet by mouth daily. 90 tablet 3  . naproxen sodium (ALEVE) 220 MG tablet Take 220 mg by mouth daily as needed (Back pain).    Glory Rosebush ULTRA test strip USE AS DIRECTED 100 strip 3  . pioglitazone-metformin (ACTOPLUS MET) 15-850 MG tablet  TAKE 1 TABLET BY MOUTH TWICE DAILY WITH A MEAL (Patient taking differently: Take 1 tablet by mouth daily. ) 180 tablet 1  . rosuvastatin (CRESTOR) 20 MG tablet TAKE 1 TABLET(20 MG) BY MOUTH DAILY (Patient taking differently: Take 20 mg by mouth daily. ) 90 tablet 3     Discharge Medications: Please see discharge summary for a list of discharge medications.  Relevant Imaging Results:  Relevant Lab Results:   Additional Information SSN: [id number]   **Patient is COVID positive**  Archie Endo, LCSW

## 2020-02-14 NOTE — Consult Note (Signed)
Chief Complaint: Patient was seen in consultation today for T12 compression fracture/vertebral augmentation.  Referring Physician(s): Karmen Bongo [facility])  Supervising Physician: Luanne Bras  Patient Status: [facility] - ED  History of Present Illness: Kristin Cook is a 86 y.o. female with a past medical history of hypertension, hyperlipidemia, overactive bladder, diabetes mellitus, dementia, cataracts, arthritis, and tobacco abuse. She presented to [facility] ED 02/12/2020 secondary to insidious onset of back pain. In ED, patient incidentally tested positive for COVID-19. CT abdomen/pelvis revealed a T12 compression fracture, confirmed acute on MR. She was admitted for further management.  CT abdomen/pelvis 02/12/2020: 1. Acute to subacute appearing anterior wedging compression deformity T12 with a to 40% height loss anteriorly. Fracture line may propagate into the posterior vertebral body cortex but without discernible transosseous extension into the posterior tension band. 2. Cholelithiasis without evidence of acute cholecystitis. 3. Small sliding-type hiatal hernia. 4. Mosaic attenuation in the lung bases as well as subpleural reticular opacities may reflect a combination of interstitial disease and small airways disease or air trapping. Consider further outpatient characterization with HRCT, as clinically warranted. 5. Aortic Atherosclerosis (ICD10-I70.0). Coronary artery atherosclerosis.  MR lumbar spine 02/13/2020: 1. Acute or subacute superior endplate fracture at Q25 with loss of height of 10%. No retropulsed bone. This looks like a benign osteoporotic fracture. 2. Thoracolumbar curvature convex to the right and lumbar curvature convex to the left. 3. Right-sided predominant degenerative changes at L2-3 and L3-4 with right lateral recess and foraminal narrowing that could possibly cause right-sided neural compressive symptoms at the L3-4 level.  IR consulted by Dr. Lorin Mercy for  possible image-guided T12 kyphoplasty/vertebroplasty. Patient awake and alert sitting in bed. Appears pleasantly confused (noted history of dementia)- history difficult to obtain secondary to this.  LD SQ Lovenox 02/12/2020 at 1908.   Past Medical History:  Diagnosis Date  . Arthritis   . Cataract   . Cystocele   . Dementia ([place])    forgetfulness  . Diabetes mellitus   . Dyslipidemia   . History of colonic polyps   . Hx MRSA infection   . Hyperlipidemia   . Hypertension   . Mental disorder   . Neuropathic pain of lower extremity   . OAB (overactive bladder)   . Shortness of breath    with exertion    Past Surgical History:  Procedure Laterality Date  . ABDOMINAL HYSTERECTOMY    . EYE SURGERY Bilateral    Cataract  . PARS PLANA VITRECTOMY Left 03/15/2013   Procedure: PARS PLANA VITRECTOMY WITH 25 GAUGE LEFT EYE with Endolaser;  Surgeon: Hurman Horn, MD;  Location: [place];  Service: Ophthalmology;  Laterality: Left;  Marland Kitchen VAGINAL PROLAPSE REPAIR  2007   Dr. Gaetano Net    Allergies: Penicillins, Sulfa antibiotics, and Sulfamethoxazole  Medications: Prior to Admission medications   Medication Sig Start Date End Date Taking? Authorizing Provider  amLODipine (NORVASC) 10 MG tablet TAKE 1 TABLET(10 MG) BY MOUTH DAILY Patient taking differently: Take 10 mg by mouth daily.  09/13/19  Yes [name], MD  lisinopril-hydrochlorothiazide (ZESTORETIC) 20-12.5 MG tablet Take 1 tablet by mouth daily. 09/13/19  Yes [name], MD  naproxen sodium (ALEVE) 220 MG tablet Take 220 mg by mouth daily as needed (Back pain).   Yes [provider]  [facility] ULTRA test strip USE AS DIRECTED 06/27/19  Yes [name], MD  pioglitazone-metformin (ACTOPLUS MET) 15-850 MG tablet TAKE 1 TABLET BY MOUTH TWICE DAILY WITH A MEAL Patient taking differently:  Take 1 tablet by mouth daily.  11/21/19  Yes Ronnald Nian, MD  rosuvastatin (CRESTOR) 20 MG tablet TAKE 1 TABLET(20 MG) BY MOUTH  DAILY Patient taking differently: Take 20 mg by mouth daily.  09/13/19  Yes Ronnald Nian, MD     Family History  Problem Relation Age of Onset  . Healthy Mother   . Diabetes Father   . Hypertension Neg Hx     Social History   Socioeconomic History  . Marital status: Single    Spouse name: Not on file  . Number of children: Not on file  . Years of education: Not on file  . Highest education level: Not on file  Occupational History  . Occupation: retired  Tobacco Use  . Smoking status: Current Every Day Smoker    Packs/day: 0.50    Years: 60.00    Pack years: 30.00  . Smokeless tobacco: Never Used  Vaping Use  . Vaping Use: Never used  Substance and Sexual Activity  . Alcohol use: No  . Drug use: No  . Sexual activity: Not Currently  Other Topics Concern  . Not on file  Social History Narrative  . Not on file   Social Determinants of Health   Financial Resource Strain:   . Difficulty of Paying Living Expenses: Not on file  Food Insecurity:   . Worried About Programme researcher, broadcasting/film/video in the Last Year: Not on file  . Ran Out of Food in the Last Year: Not on file  Transportation Needs:   . Lack of Transportation (Medical): Not on file  . Lack of Transportation (Non-Medical): Not on file  Physical Activity:   . Days of Exercise per Week: Not on file  . Minutes of Exercise per Session: Not on file  Stress:   . Feeling of Stress : Not on file  Social Connections:   . Frequency of Communication with Friends and Family: Not on file  . Frequency of Social Gatherings with Friends and Family: Not on file  . Attends Religious Services: Not on file  . Active Member of Clubs or Organizations: Not on file  . Attends Banker Meetings: Not on file  . Marital Status: Not on file     Review of Systems: A 12 point ROS discussed and pertinent positives are indicated in the HPI above.  All other systems are negative.  Review of Systems  Unable to perform ROS:  Dementia    Vital Signs: BP (!) 151/76   Pulse 73   Temp 97.8 F (36.6 C)   Resp (!) 29   SpO2 100%   Physical Exam Vitals and nursing note reviewed.  Constitutional:      General: She is not in acute distress.    Appearance: Normal appearance.  Cardiovascular:     Rate and Rhythm: Normal rate and regular rhythm.     Heart sounds: Normal heart sounds. No murmur heard.   Pulmonary:     Effort: Pulmonary effort is normal. No respiratory distress.     Breath sounds: Normal breath sounds. No wheezing.  Musculoskeletal:     Comments: (+) lidocaine patch midline mid back. Moderate tenderness of midline lower spine at approximate level of T12.  Skin:    General: Skin is warm and dry.  Neurological:     Mental Status: She is alert.     Comments: Pleasantly confused.      MD Evaluation Airway: WNL Heart: WNL Abdomen: WNL Chest/ Lungs: WNL  ASA  Classification: 3 Mallampati/Airway Score: Two   Imaging: MR LUMBAR SPINE WO CONTRAST  Result Date: 02/13/2020 CLINICAL DATA:  Acute presentation with back pain over the last few days. EXAM: MRI LUMBAR SPINE WITHOUT CONTRAST TECHNIQUE: Multiplanar, multisequence MR imaging of the lumbar spine was performed. No intravenous contrast was administered. COMPARISON:  CT abdomen yesterday. FINDINGS: Segmentation:  5 lumbar type vertebral bodies. Alignment: Thoracolumbar curvature convex to the right and lumbar curvature convex to the left. Vertebrae: Acute or subacute superior endplate fracture at T12 with loss of height of 10%. No retropulsed bone. Discogenic degenerative endplate changes on the right at L2-3 and L3-4. Conus medullaris and cauda equina: Conus extends to the L1-2 level. Conus and cauda equina appear normal. Paraspinal and other soft tissues: Negative Disc levels: L1-2: Minimal disc bulge.  No stenosis or neural compression. L2-3: Disc degeneration more pronounced on the right. Endplate osteophytes and mild bulging of the disc. No  compressive canal or foraminal narrowing. L3-4: Disc degeneration more pronounced on the right. Endplate osteophytes and shallow protrusion of the disc. Facet and ligamentous hypertrophy more prominent on the right. Mild stenosis of the right lateral recess and intervertebral foramen on the right that could possibly cause right-sided neural compressive symptoms. L4-5: Mild bulging of the disc.  No stenosis. L5-S1: Minimal bulging of the disc.  No stenosis. IMPRESSION: 1. Acute or subacute superior endplate fracture at T12 with loss of height of 10%. No retropulsed bone. This looks like a benign osteoporotic fracture. 2. Thoracolumbar curvature convex to the right and lumbar curvature convex to the left. 3. Right-sided predominant degenerative changes at L2-3 and L3-4 with right lateral recess and foraminal narrowing that could possibly cause right-sided neural compressive symptoms at the L3-4 level. Electronically Signed   By: Paulina Fusi M.D.   On: 02/13/2020 14:06   CT ABDOMEN PELVIS W CONTRAST  Result Date: 02/12/2020 CLINICAL DATA:  Four days of worsening lower back pain EXAM: CT ABDOMEN AND PELVIS WITH CONTRAST TECHNIQUE: Multidetector CT imaging of the abdomen and pelvis was performed using the standard protocol following bolus administration of intravenous contrast. CONTRAST:  OMNIPAQUE IOHEXOL 300 MG/ML  SOLN COMPARISON:  None. FINDINGS: Lower chest: Coronary artery atherosclerosis. Calcification of the mitral annulus. Cardiac size within normal limits. No pericardial effusion. Mosaic attenuation in the lung bases as well as subpleural reticular opacities may reflect a combination of interstitial disease and small airways disease or air trapping. No pleural effusion. Hepatobiliary: Diffuse hepatic hypoattenuation compatible with hepatic steatosis with a geographic hyperattenuating region adjacent the gallbladder fossa, possibly reflective of some sparing or transient hepatic attenuation  difference. No concerning focal liver lesion. Gallbladder contains several small gallstones. No pericholecystic fluid or inflammation. No biliary ductal dilatation or intraductal gallstones. Pancreas: Moderate pancreatic atrophy. No pancreatic ductal dilatation or surrounding inflammatory changes. Spleen: Normal in size. No concerning splenic lesions. Adrenals/Urinary Tract: Normal adrenal glands. Kidneys are normally located with symmetric enhancement and excretion. No suspicious renal lesion, urolithiasis or hydronephrosis. Normal bladder. Stomach/Bowel: Small sliding-type hiatal hernia. Distal stomach and duodenum are unremarkable. No small bowel thickening or dilatation. A normal appendix is visualized. No colonic dilatation or wall thickening. Vascular/Lymphatic: Atherosclerotic calcifications within the abdominal aorta and branch vessels. No aneurysm or ectasia. No enlarged abdominopelvic lymph nodes. Reproductive: Slightly retroverted uterus. No concerning adnexal lesions. Other: No abdominopelvic free fluid or free gas. No bowel containing hernias. Musculoskeletal: Acute to subacute appearing anterior wedging compression deformity T12 with a to 40% height loss fracture  line may propagate into the posterior vertebral body cortex but without discernible transosseous extension into the posterior tension band. No other acute fractures are seen. Mixed sclerotic and lucent changes are present L2-L4 centered upon focal discogenic changes at a region of maximal levocurvature in the spine. These are favored to reflect Modic type endplate changes. Sclerotic features isolated to the iliac side of the bilateral SI joints may reflect sequela of prior osteitis condensans. Findings on a background of moderate bilateral SI joint and bilateral hip osteoarthrosis including chondrocalcinosis which can reflect CPPD. IMPRESSION: 1. Acute to subacute appearing anterior wedging compression deformity T12 with a to 40% height loss  anteriorly. Fracture line may propagate into the posterior vertebral body cortex but without discernible transosseous extension into the posterior tension band. 2. Cholelithiasis without evidence of acute cholecystitis. 3. Small sliding-type hiatal hernia. 4. Mosaic attenuation in the lung bases as well as subpleural reticular opacities may reflect a combination of interstitial disease and small airways disease or air trapping. Consider further outpatient characterization with HRCT, as clinically warranted. 5. Aortic Atherosclerosis (ICD10-I70.0). Coronary artery atherosclerosis. Electronically Signed   By: Lovena Le M.D.   On: 02/12/2020 16:01   DG Chest Port 1 View  Result Date: 02/12/2020 CLINICAL DATA:  Shortness of breath. EXAM: PORTABLE CHEST 1 VIEW COMPARISON:  March 15, 2013 FINDINGS: Cardiomegaly. The hila and mediastinum are unremarkable. No pneumothorax. Increased interstitial opacities, new in the interval. No other acute abnormalities. IMPRESSION: Findings are most suggestive of cardiomegaly and mild edema. Electronically Signed   By: Dorise Bullion III M.D   On: 02/12/2020 12:39   VAS [place] LOWER EXTREMITY VENOUS (DVT)  Result Date: 02/13/2020  Lower Venous DVT Study Other Indications: Covid-19, Elevated D-Dimer. Comparison Study: No previous Venous duplex. Performing Technologist: Vonzell Schlatter  Examination Guidelines: A complete evaluation includes B-mode imaging, spectral Doppler, color Doppler, and power Doppler as needed of all accessible portions of each vessel. Bilateral testing is considered an integral part of a complete examination. Limited examinations for reoccurring indications may be performed as noted. The reflux portion of the exam is performed with the patient in reverse Trendelenburg.  +---------+---------------+---------+-----------+----------+--------------+ RIGHT    CompressibilityPhasicitySpontaneityPropertiesThrombus Aging  +---------+---------------+---------+-----------+----------+--------------+ CFV      Full           Yes      Yes                                 +---------+---------------+---------+-----------+----------+--------------+ SFJ      Full                                                        +---------+---------------+---------+-----------+----------+--------------+ FV Prox  Full                                                        +---------+---------------+---------+-----------+----------+--------------+ FV Mid   Full                                                        +---------+---------------+---------+-----------+----------+--------------+  FV DistalFull                                                        +---------+---------------+---------+-----------+----------+--------------+ PFV      Full                                                        +---------+---------------+---------+-----------+----------+--------------+ POP      Full           Yes      Yes                                 +---------+---------------+---------+-----------+----------+--------------+ PTV      Full                                                        +---------+---------------+---------+-----------+----------+--------------+ PERO     Full                                                        +---------+---------------+---------+-----------+----------+--------------+   +---------+---------------+---------+-----------+----------+--------------+ LEFT     CompressibilityPhasicitySpontaneityPropertiesThrombus Aging +---------+---------------+---------+-----------+----------+--------------+ CFV      Full           Yes      Yes                                 +---------+---------------+---------+-----------+----------+--------------+ SFJ      Full                                                         +---------+---------------+---------+-----------+----------+--------------+ FV Prox  Full                                                        +---------+---------------+---------+-----------+----------+--------------+ FV Mid   Full                                                        +---------+---------------+---------+-----------+----------+--------------+ FV DistalFull                                                        +---------+---------------+---------+-----------+----------+--------------+  PFV      Full                                                        +---------+---------------+---------+-----------+----------+--------------+ POP      Full           Yes      Yes                                 +---------+---------------+---------+-----------+----------+--------------+ PTV      Full                                                        +---------+---------------+---------+-----------+----------+--------------+ PERO     Full                                                        +---------+---------------+---------+-----------+----------+--------------+     Summary: RIGHT: - There is no evidence of deep vein thrombosis in the lower extremity.  - No cystic structure found in the popliteal fossa.  LEFT: - There is no evidence of deep vein thrombosis in the lower extremity.  - No cystic structure found in the popliteal fossa.  *See table(s) above for measurements and observations. Electronically signed by Fabienne Bruns MD on 02/13/2020 at 5:00:16 PM.    Final    ECHOCARDIOGRAM LIMITED  Result Date: 02/13/2020    ECHOCARDIOGRAM LIMITED REPORT   Patient Name:   Kristin Cook Date of Exam: 02/13/2020 Medical Rec #:  [id number]        Height:       61.0 in Accession #:    [id number]       Weight:       138.6 lb Date of Birth:  March 13, 1931        BSA:          1.616 m Patient Age:    88 years         BP:           157/74 mmHg Patient Gender: F                 HR:           85 bpm. Exam Location:  Inpatient Procedure: Limited Echo, Cardiac Doppler and Color Doppler Indications:    CHF  History:        Patient has no prior history of Echocardiogram examinations.                 Signs/Symptoms:Dementia; Risk Factors:Hypertension,                 Dyslipidemia, Diabetes and LE edema. T12 compression fracture,                 COVID+.  Sonographer:    Lavenia Atlas Referring Phys: [id number] JENNIFER YATES  Sonographer Comments: Back fracture. IMPRESSIONS  1. Left ventricular ejection fraction, by estimation, is 65 to 70%.  The left ventricle has normal function. The left ventricle has no regional wall motion abnormalities. There is moderate concentric left ventricular hypertrophy. Left ventricular diastolic function could not be evaluated. There is the interventricular septum is flattened in systole, consistent with right ventricular pressure overload.  2. Right ventricular systolic function is normal. The right ventricular size is moderately enlarged. There is severely elevated pulmonary artery systolic pressure. The estimated right ventricular systolic pressure is 16.1 mmHg.  3. The mitral valve is degenerative. Trivial mitral valve regurgitation. Mild mitral stenosis. The mean mitral valve gradient is 5.9 mmHg with average heart rate of 85 bpm. Moderate to severe mitral annular calcification.  4. The tricuspid valve is abnormal. Tricuspid valve regurgitation is moderate.  5. The aortic valve is tricuspid. There is mild calcification of the aortic valve. There is mild thickening of the aortic valve. Aortic valve regurgitation is not visualized. Mild aortic valve sclerosis is present, with no evidence of aortic valve stenosis.  6. The inferior vena cava is normal in size with <50% respiratory variability, suggesting right atrial pressure of 8 mmHg. Comparison(s): No prior Echocardiogram. Conclusion(s)/Recommendation(s): Normal LV function. Moderately dilated RV with  preserved function. RVSP severely elevated ~65 mmHG. Septal flattening is present in systole consistent with RV pressure overload. FINDINGS  Left Ventricle: Left ventricular ejection fraction, by estimation, is 65 to 70%. The left ventricle has normal function. The left ventricle has no regional wall motion abnormalities. The left ventricular internal cavity size was normal in size. There is  moderate concentric left ventricular hypertrophy. The interventricular septum is flattened in systole, consistent with right ventricular pressure overload. Left ventricular diastolic function could not be evaluated. Left ventricular diastolic function could not be evaluated due to mitral annular calcification (moderate or greater). Right Ventricle: The right ventricular size is moderately enlarged. No increase in right ventricular wall thickness. Right ventricular systolic function is normal. There is severely elevated pulmonary artery systolic pressure. The tricuspid regurgitant velocity is 3.79 m/s, and with an assumed right atrial pressure of 8 mmHg, the estimated right ventricular systolic pressure is 09.6 mmHg. Pericardium: Trivial pericardial effusion is present. Mitral Valve: The mitral valve is degenerative in appearance. There is mild calcification of the anterior and posterior mitral valve leaflet(s). Moderate to severe mitral annular calcification. Trivial mitral valve regurgitation. Mild mitral valve stenosis. MV peak gradient, 15.4 mmHg. The mean mitral valve gradient is 5.9 mmHg with average heart rate of 85 bpm. Tricuspid Valve: The tricuspid valve is abnormal. Tricuspid valve regurgitation is moderate . No evidence of tricuspid stenosis. Aortic Valve: The aortic valve is tricuspid. There is mild calcification of the aortic valve. There is mild thickening of the aortic valve. Aortic valve regurgitation is not visualized. Mild aortic valve sclerosis is present, with no evidence of aortic valve stenosis. Aorta: The  aortic root is normal in size and structure. Venous: The inferior vena cava is normal in size with less than 50% respiratory variability, suggesting right atrial pressure of 8 mmHg. LEFT VENTRICLE PLAX 2D LVIDd:         3.20 cm  Diastology LVIDs:         2.40 cm  LV e' medial:    4.35 cm/s LV PW:         1.40 cm  LV E/e' medial:  21.2 LV IVS:        1.50 cm  LV e' lateral:   3.59 cm/s LVOT diam:     2.00 cm  LV E/e' lateral: 25.7 LV  SV:         61 LV SV Index:   38 LVOT Area:     3.14 cm  RIGHT VENTRICLE RV S prime:     7.83 cm/s LEFT ATRIUM         Index LA diam:    3.10 cm 1.92 cm/m  AORTIC VALVE LVOT Vmax:   106.00 cm/s LVOT Vmean:  73.100 cm/s LVOT VTI:    0.194 m  AORTA Ao Root diam: 3.00 cm MITRAL VALVE                TRICUSPID VALVE MV Area (PHT): 3.42 cm     TR Peak grad:   57.5 mmHg MV Area VTI:   1.90 cm     TR Vmax:        379.00 cm/s MV Peak grad:  15.4 mmHg MV Mean grad:  5.9 mmHg     SHUNTS MV Vmax:       1.96 m/s     Systemic VTI:  0.19 m MV Vmean:      109.0 cm/s   Systemic Diam: 2.00 cm MV VTI:        0.32 m MV Decel Time: 222 msec MV E velocity: 92.40 cm/s MV A velocity: 173.00 cm/s MV E/A ratio:  0.53 Kristin Chiquito MD Electronically signed by Kristin Chiquito MD Signature Date/Time: 02/13/2020/3:54:22 PM    Final     Labs:  CBC: Recent Labs    01/18/20 1005 02/12/20 1208 02/13/20 0537 02/14/20 0838  WBC 6.5 6.3 6.3 9.9  HGB 14.9 13.7 15.0 14.3  HCT 44.4 41.6 45.9 43.3  PLT 263 221 248 299    COAGS: No results for input(s): INR, APTT in the last 8760 hours.  BMP: Recent Labs    01/18/20 1005 02/12/20 1208 02/13/20 0537 02/14/20 0838  NA 137 134* 135 137  K 4.4 4.1 4.1 3.1*  CL 96 99 96* 99  CO2 $Re'25 24 25 22  'ycl$ GLUCOSE 266* 246* 257* 178*  BUN $Re'11 12 18 'hwD$ 27*  CALCIUM 9.6 8.8* 8.8* 9.6  CREATININE 0.73 0.80 0.99 0.71  GFRNONAA 75 >60 56* >60  GFRAA 86  --   --   --     LIVER FUNCTION TESTS: Recent Labs    01/18/20 1005 02/12/20 1208 02/13/20 0537 02/14/20 0838   BILITOT 0.4 1.0 0.9 1.0  AST 10 13* 14* 13*  ALT $Re'8 14 14 12  'lzN$ ALKPHOS 111 82 87 67  PROT 7.2 6.4* 6.9 6.1*  ALBUMIN 4.4 3.4* 3.4* 3.1*     Assessment and Plan:  Symptomatic T12 compression fracture. Plan for image-guided T12 kyphoplasty/vertebroplasty tentatively for today in IR with Dr. Estanislado Pandy pending IR scheduling. Patient is NPO. Afebrile. Lovenox held per IR protocol. INR pending.  Risks and benefits of T12 kyphoplasty/vertebroplasty were discussed with the patient including, but not limited to education regarding the natural healing process of compression fractures without intervention, bleeding, infection, cement migration which may cause spinal cord damage, paralysis, pulmonary embolism or even death. This interventional procedure involves the use of X-rays and because of the nature of the planned procedure, it is possible that we will have prolonged use of X-ray fluoroscopy. Potential radiation risks to you include (but are not limited to) the following: - A slightly elevated risk for cancer  several years later in life. This risk is typically less than 0.5% percent. This risk is low in comparison to the normal incidence of human cancer, which is 33% for women  and 50% for men according to the [place]. - Radiation induced injury can include skin redness, resembling a rash, tissue breakdown / ulcers and hair loss (which can be temporary or permanent).  The likelihood of either of these occurring depends on the difficulty of the procedure and whether you are sensitive to radiation due to previous procedures, disease, or genetic conditions.  IF your procedure requires a prolonged use of radiation, you will be notified and given written instructions for further action.  It is your responsibility to monitor the irradiated area for the 2 weeks following the procedure and to notify your physician if you are concerned that you have suffered a radiation induced injury.    All of the patient's daughter-in-law's questions were answered, she is agreeable to proceed. Consent obtained by patient's daughter-in-law, Topanga Alvelo, via telephone secondary to patient's dementia- signed and in IR control room.   Thank you for this interesting consult.  I greatly enjoyed meeting DONEISHA IVEY and look forward to participating in their care.  A copy of this report was sent to the requesting provider on this date.  Electronically Signed: Earley Abide, PA-C 02/14/2020, 12:04 PM   I spent a total of 40 Minutes in face to face in clinical consultation, greater than 50% of which was counseling/coordinating care for T12 compression fracture/vertebral augmentation.

## 2020-02-14 NOTE — Progress Notes (Signed)
IR consulted by Dr. Ophelia Charter for possible image-guided T12 kyphoplasty/vertebroplasty.  Case/images have been reviewed by Dr. Corliss Skains who approves procedure, however patient's insurance still pending at this time.  Called patient's daughter-in-law, Kristin Cook, via telephone [phone]) at [phone] to discuss possible procedure per her request. Informed Kristin Cook on details of procedure, including indications (symptomatic compression fracture), benefits (stabilize fracture, decrease pain by at least 50%), and alternatives (conservative measures- pain management and PT). In addition, explained that insurance is still pending at this time and this is why our department has not came to speak to her mother-in-law about procedure. All questions answered and concerns addressed.  IR to follow-up once insurance authorization returned. Please call IR with questions/concerns.   Kristin Boga Issac Moure, PA-C 02/14/2020, 11:02 AM

## 2020-02-15 DIAGNOSIS — E1159 Type 2 diabetes mellitus with other circulatory complications: Secondary | ICD-10-CM

## 2020-02-15 DIAGNOSIS — I152 Hypertension secondary to endocrine disorders: Secondary | ICD-10-CM

## 2020-02-15 LAB — CBC WITH DIFFERENTIAL/PLATELET
Abs Immature Granulocytes: 0.05 10*3/uL (ref 0.00–0.07)
Basophils Absolute: 0 10*3/uL (ref 0.0–0.1)
Basophils Relative: 0 %
Eosinophils Absolute: 0 10*3/uL (ref 0.0–0.5)
Eosinophils Relative: 0 %
HCT: 39.9 % (ref 36.0–46.0)
Hemoglobin: 13.4 g/dL (ref 12.0–15.0)
Immature Granulocytes: 1 %
Lymphocytes Relative: 13 %
Lymphs Abs: 1.3 10*3/uL (ref 0.7–4.0)
MCH: 29.3 pg (ref 26.0–34.0)
MCHC: 33.6 g/dL (ref 30.0–36.0)
MCV: 87.3 fL (ref 80.0–100.0)
Monocytes Absolute: 0.9 10*3/uL (ref 0.1–1.0)
Monocytes Relative: 10 %
Neutro Abs: 7.1 10*3/uL (ref 1.7–7.7)
Neutrophils Relative %: 76 %
Platelets: 299 10*3/uL (ref 150–400)
RBC: 4.57 MIL/uL (ref 3.87–5.11)
RDW: 12.8 % (ref 11.5–15.5)
WBC: 9.4 10*3/uL (ref 4.0–10.5)
nRBC: 0 % (ref 0.0–0.2)

## 2020-02-15 LAB — COMPREHENSIVE METABOLIC PANEL
ALT: 11 U/L (ref 0–44)
AST: 13 U/L — ABNORMAL LOW (ref 15–41)
Albumin: 2.9 g/dL — ABNORMAL LOW (ref 3.5–5.0)
Alkaline Phosphatase: 72 U/L (ref 38–126)
Anion gap: 12 (ref 5–15)
BUN: 42 mg/dL — ABNORMAL HIGH (ref 8–23)
CO2: 25 mmol/L (ref 22–32)
Calcium: 9.2 mg/dL (ref 8.9–10.3)
Chloride: 98 mmol/L (ref 98–111)
Creatinine, Ser: 0.84 mg/dL (ref 0.44–1.00)
GFR, Estimated: >60 mL/min (ref >60)
Glucose, Bld: 226 mg/dL — ABNORMAL HIGH (ref 70–99)
Potassium: 3.9 mmol/L (ref 3.5–5.1)
Sodium: 135 mmol/L (ref 135–145)
Total Bilirubin: 0.7 mg/dL (ref 0.3–1.2)
Total Protein: 5.6 g/dL — ABNORMAL LOW (ref 6.5–8.1)

## 2020-02-15 LAB — GLUCOSE, CAPILLARY
Glucose-Capillary: 247 mg/dL — ABNORMAL HIGH (ref 70–99)
Glucose-Capillary: 240 mg/dL — ABNORMAL HIGH (ref 70–99)
Glucose-Capillary: 272 mg/dL — ABNORMAL HIGH (ref 70–99)
Glucose-Capillary: 209 mg/dL — ABNORMAL HIGH (ref 70–99)

## 2020-02-15 LAB — PHOSPHORUS: Phosphorus: 3.8 mg/dL (ref 2.5–4.6)

## 2020-02-15 LAB — MAGNESIUM: Magnesium: 1.6 mg/dL — ABNORMAL LOW (ref 1.7–2.4)

## 2020-02-15 LAB — C-REACTIVE PROTEIN: CRP: 0.7 mg/dL (ref <1.0)

## 2020-02-15 LAB — D-DIMER, QUANTITATIVE: D-Dimer, Quant: 1.5 ug/mL-FEU — ABNORMAL HIGH (ref 0.00–0.50)

## 2020-02-15 MED ORDER — MAGNESIUM SULFATE 2 GM/50ML IV SOLN
2.0000 g | Freq: Once | INTRAVENOUS | Status: AC
  Administered 2020-02-15: 2 g via INTRAVENOUS
  Filled 2020-02-15: qty 50

## 2020-02-15 NOTE — Progress Notes (Signed)
Physical Therapy Treatment Patient Details Name: Kristin Cook MRN: [id number] DOB: 1931-05-31 Today's Date: 02/15/2020    History of Present Illness 86yo female presenting with back pain, CT positive for T12 compression fracture. Noted to have increasing supplemental O2 needs in the ED and incidentally found to be Covid positive. PMH dementia, DM, dyslipidemia, HLD, HTN, unspecified mental disorder, DOE    PT Comments    Pt making steady progress. Continue to recommend ST-SNF.    Follow Up Recommendations  SNF;Supervision/Assistance - 24 hour     Equipment Recommendations  None recommended by PT    Recommendations for Other Services       Precautions / Restrictions Precautions Precautions: Fall;Other (comment) Precaution Comments: back precautions, TLSO for EOB/OOB Required Braces or Orthoses: Spinal Brace Spinal Brace: Thoracolumbosacral orthotic;Applied in sitting position    Mobility  Bed Mobility Overal bed mobility: Needs Assistance Bed Mobility: Rolling;Sidelying to Sit;Sit to Sidelying Rolling: Min assist Sidelying to sit: Min assist       General bed mobility comments: Assist to elevate trunk into sitting.  Transfers Overall transfer level: Needs assistance Equipment used: Rolling walker (2 wheeled) Transfers: Sit to/from [employer] Sit to Stand: Min assist Stand pivot transfers: Min assist       General transfer comment: Assist to bring hips up and for balance  Ambulation/Gait Ambulation/Gait assistance: Min assist Gait Distance (Feet): 30 Feet Assistive device: Rolling walker (2 wheeled) Gait Pattern/deviations: Step-through pattern;Decreased stride length Gait velocity: decr Gait velocity interpretation: <1.8 ft/sec, indicate of risk for recurrent falls General Gait Details: Assist for balance. Verbal cues to keep feet closer to walker especially when turning.   Stairs             Wheelchair Mobility    Modified  Rankin (Stroke Patients Only)       Balance Overall balance assessment: Needs assistance Sitting-balance support: No upper extremity supported;Feet supported Sitting balance-Leahy Scale: Fair     Standing balance support: Bilateral upper extremity supported Standing balance-Leahy Scale: Poor Standing balance comment: walker and min assist for static standing                            Cognition Arousal/Alertness: Awake/alert Behavior During Therapy: WFL for tasks assessed/performed Overall Cognitive Status: No family/caregiver present to determine baseline cognitive functioning Area of Impairment: Orientation;Attention;Memory;Following commands;Safety/judgement;Awareness;Problem solving                 Orientation Level: Disoriented to;Situation;Time Current Attention Level: Sustained Memory: Decreased recall of precautions;Decreased short-term memory Following Commands: Follows one step commands consistently Safety/Judgement: Decreased awareness of safety;Decreased awareness of deficits Awareness: Intellectual Problem Solving: Slow processing;Requires verbal cues;Requires tactile cues        Exercises      General Comments General comments (skin integrity, edema, etc.): VSS      Pertinent Vitals/Pain Pain Assessment: Faces Faces Pain Scale: Hurts little more Pain Location: back Pain Descriptors / Indicators: Discomfort;Grimacing;Moaning Pain Intervention(s): Limited activity within patient's tolerance;Monitored during session;Repositioned    Home Living Family/patient expects to be discharged to:: Private residence Living Arrangements: Children Available Help at Discharge: Family;Available PRN/intermittently (daughter in law) Type of Home: House     Home Layout: Two level;Able to live on main level with bedroom/bathroom Home Equipment: Gil[employer] Mor - single point;Walker - 2 wheels;Shower seat      Prior Function Level of Independence: Independent  with assistive device(s)      Comments: uses cane at  all times for mobility; DIL manages IADL. Pt is independent in ADL. Has been doing wash ups due to poor ability to transfer in and out of tub shower   PT Goals (current goals can now be found in the care plan section) Progress towards PT goals: Progressing toward goals    Frequency    Min 2X/week      PT Plan Current plan remains appropriate;Frequency needs to be updated    Co-evaluation              AM-PAC PT "6 Clicks" Mobility   Outcome Measure  Help needed turning from your back to your side while in a flat bed without using bedrails?: A Little Help needed moving from lying on your back to sitting on the side of a flat bed without using bedrails?: A Little Help needed moving to and from a bed to a chair (including a wheelchair)?: A Little Help needed standing up from a chair using your arms (e.g., wheelchair or bedside chair)?: A Little Help needed to walk in hospital room?: A Little Help needed climbing 3-5 steps with a railing? : A Lot 6 Click Score: 17    End of Session Equipment Utilized During Treatment: Gait belt;Back brace Activity Tolerance: Patient tolerated treatment well Patient left: in chair;with call bell/phone within reach;with chair alarm set (ED stretcher) Nurse Communication: Mobility status PT Visit Diagnosis: Unsteadiness on feet (R26.81);Muscle weakness (generalized) (M62.81);Pain Pain - Right/Left:  (midline) Pain - part of body:  (back pain)     Time: 7121-9758 PT Time Calculation (min) (ACUTE ONLY): 44 min  Charges:  $Gait Training: 38-52 mins                     [facility] PT Acute Rehabilitation Services Pager [phone] Office [phone]    Angelina Ok [facility] 02/15/2020, 1:23 PM

## 2020-02-15 NOTE — Evaluation (Signed)
Occupational Therapy Evaluation Patient Details Name: Kristin Cook MRN: [id number] DOB: 24-Nov-1931 Today's Date: 02/15/2020    History of Present Illness 86yo female presenting with back pain, CT positive for T12 compression fracture. Noted to have increasing supplemental O2 needs in the ED and incidentally found to be Covid positive. PMH dementia, DM, dyslipidemia, HLD, HTN, unspecified mental disorder, DOE   Clinical Impression   PTA pt living with family and functioning at mod I level using SPC. At time of eval, pt requiring min A for bed mobility and sit <> stand transfers with RW. Pt requires max cues and reminders to manage back precautions, is disoriented to current situation. Also noted cognitive deficits in STM, problem solving, and awareness. Education given to pt on back precautions as it applies to ADL routine, but pt will require increased supervision and training to follow these precautions due to baseline cognitive deficits. Given current status, recommend SNF at d/c for continued ADL progression prior to d/c home. Will continue to follow per POC listed below.     Follow Up Recommendations  SNF    Equipment Recommendations  3 in 1 bedside commode    Recommendations for Other Services       Precautions / Restrictions Precautions Precautions: Fall;Other (comment) Precaution Comments: reviewed spinal precautions with ADL Required Braces or Orthoses: Spinal Brace Spinal Brace: Thoracolumbosacral orthotic;Applied in sitting position Restrictions Weight Bearing Restrictions: No      Mobility Bed Mobility Overal bed mobility: Needs Assistance Bed Mobility: Rolling;Sit to Sidelying Rolling: Min assist Sidelying to sit: Min assist     Sit to sidelying: Min assist General bed mobility comments: step by step cues to sequence log roll technique with physical assist for legs back onto bed    Transfers Overall transfer level: Needs assistance Equipment used: Rolling  walker (2 wheeled) Transfers: Sit to/from [employer] Sit to Stand: Min assist Stand pivot transfers: Min assist       General transfer comment: assist to rise and steady with use of RW    Balance Overall balance assessment: Needs assistance Sitting-balance support: No upper extremity supported;Feet supported Sitting balance-Leahy Scale: Fair     Standing balance support: Bilateral upper extremity supported;During functional activity Standing balance-Leahy Scale: Poor Standing balance comment: reliant on external support                           ADL either performed or assessed with clinical judgement   ADL Overall ADL's : Needs assistance/impaired Eating/Feeding: Set up;Sitting   Grooming: Min guard;Standing   Upper Body Bathing: Set up;Sitting   Lower Body Bathing: Minimal assistance;Sit to/from stand;Sitting/lateral leans   Upper Body Dressing : Minimal assistance;Sitting Upper Body Dressing Details (indicate cue type and reason): don/doff brace Lower Body Dressing: Moderate assistance;Sitting/lateral leans;Sit to/from stand;Cueing for back precautions   Toilet Transfer: Minimal assistance;RW;Ambulation;Regular Toilet;Grab bars           Functional mobility during ADLs: Minimal assistance;Rolling walker;Cueing for safety General ADL Comments: increased cues needed to manage back precautions due to cognitive deficits     Vision Baseline Vision/History: Wears glasses Wears Glasses: At all times Patient Visual Report: No change from baseline       Perception     Praxis      Pertinent Vitals/Pain Pain Assessment: Faces Faces Pain Scale: Hurts little more Pain Location: back Pain Descriptors / Indicators: Discomfort;Grimacing;Moaning Pain Intervention(s): Limited activity within patient's tolerance;Monitored during session;Repositioned  Hand Dominance     Extremity/Trunk Assessment Upper Extremity Assessment Upper  Extremity Assessment: Generalized weakness   Lower Extremity Assessment Lower Extremity Assessment: Generalized weakness       Communication Communication Communication: No difficulties   Cognition Arousal/Alertness: Awake/alert Behavior During Therapy: WFL for tasks assessed/performed Overall Cognitive Status: No family/caregiver present to determine baseline cognitive functioning Area of Impairment: Orientation;Memory;Following commands;Safety/judgement;Awareness;Problem solving                 Orientation Level: Disoriented to;Situation;Time Current Attention Level: Sustained Memory: Decreased short-term memory;Decreased recall of precautions Following Commands: Follows one step commands consistently Safety/Judgement: Decreased awareness of safety;Decreased awareness of deficits Awareness: Emergent Problem Solving: Slow processing;Requires verbal cues;Requires tactile cues General Comments: little awareness of current situation. Has baseline dementia. Requires cues and reminders of back precautions frequently throughout session.  Best responds to single step commands with increased time   General Comments  VSS    Exercises     Shoulder Instructions      Home Living Family/patient expects to be discharged to:: Private residence Living Arrangements: Children Available Help at Discharge: Family;Available PRN/intermittently (daughter in law) Type of Home: House       Home Layout: Two level;Able to live on main level with bedroom/bathroom Alternate Level Stairs-Number of Steps: DIL lives upstairs   Bathroom Shower/Tub: Tub/shower unit         Home Equipment: [employer] - single point;Walker - 2 wheels;Shower seat          Prior Functioning/Environment Level of Independence: Independent with assistive device(s)        Comments: uses cane at all times for mobility; DIL manages IADL. Pt is independent in ADL. Has been doing wash ups due to poor ability to transfer  in and out of tub shower        OT Problem List: Decreased strength;Decreased knowledge of use of DME or AE;Decreased knowledge of precautions;Decreased activity tolerance;Decreased cognition;Impaired balance (sitting and/or standing);Decreased safety awareness;Pain      OT Treatment/Interventions: Self-care/ADL training;Therapeutic exercise;Patient/family education;Balance training;Energy conservation;Therapeutic activities;DME and/or AE instruction;Cognitive remediation/compensation    OT Goals(Current goals can be found in the care plan section) Acute Rehab OT Goals Patient Stated Goal: return home OT Goal Formulation: With patient Time For Goal Achievement: 02/29/20 Potential to Achieve Goals: Good  OT Frequency: Min 2X/week   Barriers to D/C:            Co-evaluation              AM-PAC OT "6 Clicks" Daily Activity     Outcome Measure Help from another person eating meals?: A Little Help from another person taking care of personal grooming?: A Little Help from another person toileting, which includes using toliet, bedpan, or urinal?: A Lot Help from another person bathing (including washing, rinsing, drying)?: A Lot Help from another person to put on and taking off regular upper body clothing?: A Little Help from another person to put on and taking off regular lower body clothing?: A Lot 6 Click Score: 15   End of Session Equipment Utilized During Treatment: Gait belt;Rolling walker;Back brace Nurse Communication: Mobility status;Precautions  Activity Tolerance: Patient tolerated treatment well Patient left: in bed;with call bell/phone within reach;with bed alarm set  OT Visit Diagnosis: Unsteadiness on feet (R26.81);Other abnormalities of gait and mobility (R26.89);Muscle weakness (generalized) (M62.81);Pain;Other symptoms and signs involving cognitive function Pain - part of body:  (back)  Time: 2563-8937 OT Time Calculation (min): 17  min Charges:  OT General Charges $OT Visit: 1 Visit OT Evaluation $OT Eval Moderate Complexity: 1 Mod  Dalphine Handing, MSOT, OTR/L Acute Rehabilitation Services [facility] Office Number: [phone] Pager: [phone]  Dalphine Handing 02/15/2020, 2:55 PM

## 2020-02-15 NOTE — Progress Notes (Signed)
PROGRESS NOTE                                                                                                                                                                                                             Patient Demographics:    Atiyana Cook, is a 86 y.o. female, DOB - [birth year], MEQ:[id number]  Outpatient Primary MD for the patient is Kristin Nian, MD   Admit date - 02/12/2020   LOS - 3  Chief Complaint  Patient presents with  . Back Pain       Brief Narrative: Patient is a 86 y.o. female with PMHx of HTN, HLD, DM-2-presented with back pain-imaging studies showed a T12 compression fracture-evaluated by neurosurgery who recommended TLSO brace/PT/OT-subsequently seen by IR-and underwent kyphoplasty.  She was also found to have COVID-19 infection on initial presentation.  See below for further details.  COVID-19 vaccinated status: Vaccinated  Significant Events: 12/5>> Admit to [facility] for back pain due to T12 compression fracture, COVID-19 infection.  Significant studies: 12/5>> CT abdomen/pelvis: Acute/subacute compression deformity of T12 vertebra, mosaic attenuation of the lung bases/subpleural reticular opacities 12/5>> chest x-ray: Interstitial opacities bilaterally 12/6>> Echo: EF 65-70%, RVSP 65.5 mmHg. 12/6>> bilateral lower extremity Doppler: No DVT  COVID-19 medications: Steroids: 12/5>> Remdesivir: 12/5>>  Antibiotics: None  Microbiology data: None  Procedures: 12/7>>Kyphoplasty  Consults: IR, neurosurgery  DVT prophylaxis: enoxaparin (LOVENOX) injection 40 mg Start: 02/15/20 1000    Subjective:    Kristin Cook today was lying comfortably in bed-minimal back pain.   Assessment  & Plan :   T12 compression fracture: Underwent kyphoplasty by IR on 12/7-admitting physician also discussed with neurosurgery on-call.  Continue PT/OT-social worker following for SNF  placement.  Acute Hypoxic Resp Failure due to Covid 19 Viral pneumonia: Appears to have mild disease-minimally hypoxic this morning-on just 1 L of oxygen.  Continue steroids/Remdesivir.  Some suspicion that she may have underlying interstitial lung disease at baseline-needs outpatient pulmonary follow-up  Fever: afebrile O2 requirements:  SpO2: 95 % O2 Flow Rate (L/min): 1 L/min   COVID-19 Labs: Recent Labs    02/13/20 0537 02/14/20 0838 02/15/20 0220  DDIMER 1.73* 1.12* 1.50*  CRP 1.5* 0.7 0.7       Component Value Date/Time   BNP 234.2 (H) 02/12/2020 1208    No results for input(s): PROCALCITON in  the last 168 hours.  Lab Results  Component Value Date   SARSCOV2NAA POSITIVE (A) 02/12/2020     Prone/Incentive Spirometry: encouraged  incentive spirometry use 3-4/hour.  Acute metabolic encephalopathy superimposed on mild dementia: Significantly improved-awake/alert this morning.  Expect some amount of delirium during this hospital stay.  Maintain delirium precautions  Possible underlying interstitial lung disease: See above imaging studies-Dr. Elgergawy-discussed with pulmonology-recommendations are for outpatient follow-up with pulmonology to repeat imaging/HRCT.  Pulmonary hypertension: Likely a sequelae of underlying interstitial lung disease-incidental finding-stable for outpatient follow-up with pulmonology/cardiology.  HTN: BP reasonably controlled-continue amlodipine, HCTZ/lisinopril and follow.  HLD: Continue statin  DM-2 (A1c 9.0 on 01/18/2020) with steroid-induced hyperglycemia: CBGs remain uncontrolled-start Lantus 10 units, continue SSI and follow.  Plan is to resume oral hypoglycemic agents on discharge.  Recent Labs    02/14/20 1751 02/14/20 2048 02/15/20 0734  GLUCAP 249* 352* 247*   ABG: No results found for: PHART, PCO2ART, PO2ART, HCO3, TCO2, ACIDBASEDEF, O2SAT  Vent Settings: N/A    Condition -Stable  Family Communication  :  Son Kristin Cook  [phone]) updated over the phone on 12/8  Code Status :DNR  Diet :  Diet Order            Diet heart healthy/carb modified Room service appropriate? Yes; Fluid consistency: Thin  Diet effective now                  Disposition Plan  :   Status is: Inpatient  Remains inpatient appropriate because:Inpatient level of care appropriate due to severity of illness   Dispo: The patient is from: Home              Anticipated d/c is to: SNF              Anticipated d/c date is: 1 day              Patient currently is not medically stable to d/c.   Barriers to discharge: Hypoxia requiring O2 supplementation/complete 5 days of IV Remdesivir  Antimicorbials  :    Anti-infectives (From admission, onward)   Start     Dose/Rate Route Frequency Ordered Stop   02/14/20 1438  tobramycin (NEBCIN) 1.2 g powder       Note to Pharmacy: Daleen Snook   : cabinet override      02/14/20 1438 02/15/20 0244   02/14/20 1415  vancomycin (VANCOCIN) IVPB 1000 mg/200 mL premix        1,000 mg 200 mL/hr over 60 Minutes Intravenous  Once 02/14/20 1406 02/14/20 1540   02/13/20 1000  remdesivir 100 mg in sodium chloride 0.9 % 100 mL IVPB       "Followed by" Linked Group Details   100 mg 200 mL/hr over 30 Minutes Intravenous Daily 02/12/20 2243 02/17/20 0959   02/12/20 2330  remdesivir 200 mg in sodium chloride 0.9% 250 mL IVPB       "Followed by" Linked Group Details   200 mg 580 mL/hr over 30 Minutes Intravenous Once 02/12/20 2243 02/13/20 0105      Inpatient Medications  Scheduled Meds: . albuterol  2 puff Inhalation Q6H  . amLODipine  10 mg Oral Daily  . vitamin C  500 mg Oral Daily  . calcium-vitamin D  1 tablet Oral BID  . dexamethasone (DECADRON) injection  6 mg Intravenous Daily  . docusate sodium  100 mg Oral BID  . enoxaparin (LOVENOX) injection  40 mg Subcutaneous Daily  . lisinopril  20 mg  Oral Daily   And  . hydrochlorothiazide  12.5 mg Oral Daily  . insulin aspart  0-15  Units Subcutaneous TID WC  . insulin aspart  0-5 Units Subcutaneous QHS  . lidocaine  1 patch Transdermal Q24H  . nicotine  14 mg Transdermal Daily  . rosuvastatin  20 mg Oral Daily  . senna-docusate  2 tablet Oral BID  . sodium chloride flush  3 mL Intravenous Q12H  . zinc sulfate  220 mg Oral Daily   Continuous Infusions: . remdesivir 100 mg in NS 100 mL Stopped (02/14/20 1011)   PRN Meds:.acetaminophen **OR** acetaminophen, bisacodyl, guaiFENesin-dextromethorphan, hydrALAZINE, morphine injection, ondansetron **OR** ondansetron (ZOFRAN) IV, oxyCODONE, polyethylene glycol   Time Spent in minutes  25  See all Orders from today for further details   Jeoffrey Massed M.D on 02/15/2020 at 11:38 AM  To page go to [email or web address] - use universal password  [facility] -  Office  [phone]    Objective:   Vitals:   02/14/20 2059 02/15/20 0000 02/15/20 0356 02/15/20 0819  BP:  134/64 (!) 152/81 (!) 153/67  Pulse:  (!) 59 84   Resp:  16 14   Temp:  97.7 F (36.5 C) 97.7 F (36.5 C)   TempSrc:  Axillary Oral   SpO2:  96% 95%   Weight:      Height: 5\' 1"  (1.549 m)       Wt Readings from Last 3 Encounters:  02/14/20 61.6 kg  01/18/20 62.9 kg  09/13/19 63.4 kg     Intake/Output Summary (Last 24 hours) at 02/15/2020 1138 Last data filed at 02/15/2020 0647 Gross per 24 hour  Intake 50 ml  Output 1 ml  Net 49 ml     Physical Exam Gen Exam:Alert awake-not in any distress HEENT:atraumatic, normocephalic Chest: B/L clear to auscultation anteriorly CVS:S1S2 regular Abdomen:soft non tender, non distended Extremities:no edema Neurology: Non focal Skin: no rash   Data Review:    CBC Recent Labs  Lab 02/12/20 1208 02/13/20 0537 02/14/20 0838 02/15/20 0220  WBC 6.3 6.3 9.9 9.4  HGB 13.7 15.0 14.3 13.4  HCT 41.6 45.9 43.3 39.9  PLT 221 248 299 299  MCV 89.8 89.8 90.2 87.3  MCH 29.6 29.4 29.8 29.3  MCHC 32.9 32.7 33.0 33.6  RDW 12.9 12.8 12.6 12.8   LYMPHSABS 0.6* 0.5* 1.4 1.3  MONOABS 0.6 0.2 0.8 0.9  EOSABS 0.1 0.0 0.0 0.0  BASOSABS 0.0 0.0 0.0 0.0    Chemistries  Recent Labs  Lab 02/12/20 1208 02/13/20 0537 02/14/20 0838 02/15/20 0220  NA 134* 135 137 135  K 4.1 4.1 3.1* 3.9  CL 99 96* 99 98  CO2 24 25 22 25   GLUCOSE 246* 257* 178* 226*  BUN 12 18 27* 42*  CREATININE 0.80 0.99 0.71 0.84  CALCIUM 8.8* 8.8* 9.6 9.2  MG  --  1.7 1.9 1.6*  AST 13* 14* 13* 13*  ALT 14 14 12 11   ALKPHOS 82 87 67 72  BILITOT 1.0 0.9 1.0 0.7   ------------------------------------------------------------------------------------------------------------------ No results for input(s): CHOL, HDL, LDLCALC, TRIG, CHOLHDL, LDLDIRECT in the last 72 hours.  Lab Results  Component Value Date   HGBA1C 9.0 (A) 01/18/2020   ------------------------------------------------------------------------------------------------------------------ No results for input(s): TSH, T4TOTAL, T3FREE, THYROIDAB in the last 72 hours.  Invalid input(s): FREET3 ------------------------------------------------------------------------------------------------------------------ No results for input(s): VITAMINB12, FOLATE, FERRITIN, TIBC, IRON, RETICCTPCT in the last 72 hours.  Coagulation profile Recent Labs  Lab 02/14/20 1814  INR 1.1  Recent Labs    02/14/20 0838 02/15/20 0220  DDIMER 1.12* 1.50*    Cardiac Enzymes No results for input(s): CKMB, TROPONINI, MYOGLOBIN in the last 168 hours.  Invalid input(s): CK ------------------------------------------------------------------------------------------------------------------    Component Value Date/Time   BNP 234.2 (H) 02/12/2020 1208    Micro Results Recent Results (from the past 240 hour(s))  Resp Panel by RT-PCR (Flu A&B, Covid) Nasopharyngeal Swab     Status: Abnormal   Collection Time: 02/12/20  8:30 PM   Specimen: Nasopharyngeal Swab; Nasopharyngeal(NP) swabs in vial transport medium  Result  Value Ref Range Status   SARS Coronavirus 2 by RT PCR POSITIVE (A) NEGATIVE Final    Comment: RESULT CALLED TO, READ BACK BY AND VERIFIED WITH: M. CARDUFF,RN 2203 02/12/2020 T. TYSOR (NOTE) SARS-CoV-2 target nucleic acids are DETECTED.  The SARS-CoV-2 RNA is generally detectable in upper respiratory specimens during the acute phase of infection. Positive results are indicative of the presence of the identified virus, but do not rule out bacterial infection or co-infection with other pathogens not detected by the test. Clinical correlation with patient history and other diagnostic information is necessary to determine patient infection status. The expected result is Negative.  Fact Sheet for Patients: BloggerCourse.com  Fact Sheet for Healthcare Providers: SeriousBroker.it  This test is not yet approved or cleared by the [place] FDA and  has been authorized for detection and/or diagnosis of SARS-CoV-2 by FDA under an Emergency Use Authorization (EUA).  This EUA will remain in effect (meaning this test c an be used) for the duration of  the COVID-19 declaration under Section 564(b)(1) of the Act, 21 U.S.C. section 360bbb-3(b)(1), unless the authorization is terminated or revoked sooner.     Influenza A by PCR NEGATIVE NEGATIVE Final   Influenza B by PCR NEGATIVE NEGATIVE Final    Comment: (NOTE) The Xpert Xpress SARS-CoV-2/FLU/RSV plus assay is intended as an aid in the diagnosis of influenza from Nasopharyngeal swab specimens and should not be used as a sole basis for treatment. Nasal washings and aspirates are unacceptable for Xpert Xpress SARS-CoV-2/FLU/RSV testing.  Fact Sheet for Patients: BloggerCourse.com  Fact Sheet for Healthcare Providers: SeriousBroker.it  This test is not yet approved or cleared by the [place] FDA and has been authorized for detection  and/or diagnosis of SARS-CoV-2 by FDA under an Emergency Use Authorization (EUA). This EUA will remain in effect (meaning this test can be used) for the duration of the COVID-19 declaration under Section 564(b)(1) of the Act, 21 U.S.C. section 360bbb-3(b)(1), unless the authorization is terminated or revoked.  Performed at [facility], [address], [place], [place] [address]     Radiology Reports MR LUMBAR SPINE WO CONTRAST  Result Date: 02/13/2020 CLINICAL DATA:  Acute presentation with back pain over the last few days. EXAM: MRI LUMBAR SPINE WITHOUT CONTRAST TECHNIQUE: Multiplanar, multisequence MR imaging of the lumbar spine was performed. No intravenous contrast was administered. COMPARISON:  CT abdomen yesterday. FINDINGS: Segmentation:  5 lumbar type vertebral bodies. Alignment: Thoracolumbar curvature convex to the right and lumbar curvature convex to the left. Vertebrae: Acute or subacute superior endplate fracture at T12 with loss of height of 10%. No retropulsed bone. Discogenic degenerative endplate changes on the right at L2-3 and L3-4. Conus medullaris and cauda equina: Conus extends to the L1-2 level. Conus and cauda equina appear normal. Paraspinal and other soft tissues: Negative Disc levels: L1-2: Minimal disc bulge.  No stenosis or neural compression. L2-3: Disc degeneration more  pronounced on the right. Endplate osteophytes and mild bulging of the disc. No compressive canal or foraminal narrowing. L3-4: Disc degeneration more pronounced on the right. Endplate osteophytes and shallow protrusion of the disc. Facet and ligamentous hypertrophy more prominent on the right. Mild stenosis of the right lateral recess and intervertebral foramen on the right that could possibly cause right-sided neural compressive symptoms. L4-5: Mild bulging of the disc.  No stenosis. L5-S1: Minimal bulging of the disc.  No stenosis. IMPRESSION: 1. Acute or subacute superior endplate fracture at  T12 with loss of height of 10%. No retropulsed bone. This looks like a benign osteoporotic fracture. 2. Thoracolumbar curvature convex to the right and lumbar curvature convex to the left. 3. Right-sided predominant degenerative changes at L2-3 and L3-4 with right lateral recess and foraminal narrowing that could possibly cause right-sided neural compressive symptoms at the L3-4 level. Electronically Signed   By: Paulina Fusi M.D.   On: 02/13/2020 14:06   CT ABDOMEN PELVIS W CONTRAST  Result Date: 02/12/2020 CLINICAL DATA:  Four days of worsening lower back pain EXAM: CT ABDOMEN AND PELVIS WITH CONTRAST TECHNIQUE: Multidetector CT imaging of the abdomen and pelvis was performed using the standard protocol following bolus administration of intravenous contrast. CONTRAST:  OMNIPAQUE IOHEXOL 300 MG/ML  SOLN COMPARISON:  None. FINDINGS: Lower chest: Coronary artery atherosclerosis. Calcification of the mitral annulus. Cardiac size within normal limits. No pericardial effusion. Mosaic attenuation in the lung bases as well as subpleural reticular opacities may reflect a combination of interstitial disease and small airways disease or air trapping. No pleural effusion. Hepatobiliary: Diffuse hepatic hypoattenuation compatible with hepatic steatosis with a geographic hyperattenuating region adjacent the gallbladder fossa, possibly reflective of some sparing or transient hepatic attenuation difference. No concerning focal liver lesion. Gallbladder contains several small gallstones. No pericholecystic fluid or inflammation. No biliary ductal dilatation or intraductal gallstones. Pancreas: Moderate pancreatic atrophy. No pancreatic ductal dilatation or surrounding inflammatory changes. Spleen: Normal in size. No concerning splenic lesions. Adrenals/Urinary Tract: Normal adrenal glands. Kidneys are normally located with symmetric enhancement and excretion. No suspicious renal lesion, urolithiasis or hydronephrosis.  Normal bladder. Stomach/Bowel: Small sliding-type hiatal hernia. Distal stomach and duodenum are unremarkable. No small bowel thickening or dilatation. A normal appendix is visualized. No colonic dilatation or wall thickening. Vascular/Lymphatic: Atherosclerotic calcifications within the abdominal aorta and branch vessels. No aneurysm or ectasia. No enlarged abdominopelvic lymph nodes. Reproductive: Slightly retroverted uterus. No concerning adnexal lesions. Other: No abdominopelvic free fluid or free gas. No bowel containing hernias. Musculoskeletal: Acute to subacute appearing anterior wedging compression deformity T12 with a to 40% height loss fracture line may propagate into the posterior vertebral body cortex but without discernible transosseous extension into the posterior tension band. No other acute fractures are seen. Mixed sclerotic and lucent changes are present L2-L4 centered upon focal discogenic changes at a region of maximal levocurvature in the spine. These are favored to reflect Modic type endplate changes. Sclerotic features isolated to the iliac side of the bilateral SI joints may reflect sequela of prior osteitis condensans. Findings on a background of moderate bilateral SI joint and bilateral hip osteoarthrosis including chondrocalcinosis which can reflect CPPD. IMPRESSION: 1. Acute to subacute appearing anterior wedging compression deformity T12 with a to 40% height loss anteriorly. Fracture line may propagate into the posterior vertebral body cortex but without discernible transosseous extension into the posterior tension band. 2. Cholelithiasis without evidence of acute cholecystitis. 3. Small sliding-type hiatal hernia. 4. Mosaic attenuation in the  lung bases as well as subpleural reticular opacities may reflect a combination of interstitial disease and small airways disease or air trapping. Consider further outpatient characterization with HRCT, as clinically warranted. 5. Aortic  Atherosclerosis (ICD10-I70.0). Coronary artery atherosclerosis. Electronically Signed   By: Kreg Shropshire M.D.   On: 02/12/2020 16:01   DG Chest Port 1 View  Result Date: 02/12/2020 CLINICAL DATA:  Shortness of breath. EXAM: PORTABLE CHEST 1 VIEW COMPARISON:  March 15, 2013 FINDINGS: Cardiomegaly. The hila and mediastinum are unremarkable. No pneumothorax. Increased interstitial opacities, new in the interval. No other acute abnormalities. IMPRESSION: Findings are most suggestive of cardiomegaly and mild edema. Electronically Signed   By: Gerome Sam III M.D   On: 02/12/2020 12:39   IR KYPHO LUMBAR INC FX REDUCE BONE BX UNI/BIL CANNULATION INC/IMAGING  Result Date: 02/15/2020 INDICATION: Severe low back pain secondary to compression fracture at T12. EXAM: BALLOON KYPHOPLASTY T12 COMPARISON:  MRI lumbosacral spine of February 13, 2020. MEDICATIONS: As antibiotic prophylaxis, Ancef 2 g IV was ordered pre-procedure and administered intravenously within 1 hour of incision. ANESTHESIA/SEDATION: Moderate (conscious) sedation was employed during this procedure. A total of Versed 1.5 mg and Fentanyl 50 mcg was administered intravenously. Moderate Sedation Time: 28 minutes. The patient's level of consciousness and vital signs were monitored continuously by radiology nursing throughout the procedure under my direct supervision. FLUOROSCOPY TIME:  Fluoroscopy Time: 10 minutes 36 seconds (557 mGy) COMPLICATIONS: None immediate. PROCEDURE: Following a full explanation of the procedure along with the potential associated complications, an informed witnessed consent was obtained. The patient was placed prone on the fluoroscopic table. The skin overlying the thoracolumbar region was then prepped and draped in the usual sterile fashion. The right pedicle at T12 was then infiltrated with 0.25% bupivacaine followed by the advancement of an 11-gauge Jamshidi needle through the right pedicle into the posterior one-third at  T12. This was then exchanged for a Kyphon advanced osteo introducer system comprised of a working cannula and a Kyphon osteo drill. This combination was then advanced over a Kyphon osteo bone pin until the tip of the Kyphon osteo drill was in the posterior third at T12. At this time, the bone pin was removed. In a medial trajectory, the combination was advanced until the tip of the working cannula was inside the posterior one-third at T12. The osteo drill was removed. Through the working cannula, a Kyphon inflatable bone tamp 20 x 3 was advanced and positioned with the distal marker 5 mm from the anterior aspect of T12. Crossing of the midline was seen on the AP projection. At this time, the balloon was expanded using contrast via a Kyphon inflation syringe device via microtubing. Inflations were continued until there was apposition with the superior endplate. At this time, methylmethacrylate mixture was reconstituted with Tobramycin in the Kyphon bone mixing device system. This was then loaded onto the Kyphon bone fillers. The balloon was deflated and removed followed by the instillation of 4 bone filler equivalents of methylmethacrylate mixture at T12 with excellent filling in the AP and lateral projections. No extravasation was noted in the disk spaces or posteriorly into the spinal canal. A minute amount of methylmethacrylate mixture was seen in adjacent paraspinous vein, where it remained stable throughout the procedure. The working cannula and the bone filler were then retrieved and removed. Hemostasis was achieved at the skin entry site. Patient was then returned to the floor in a stable condition. IMPRESSION: 1. Status post vertebral body augmentation using balloon kyphoplasty  at T12 as described without event. Electronically Signed   By: Julieanne Cotton M.D.   On: 02/14/2020 17:13   VAS [place] LOWER EXTREMITY VENOUS (DVT)  Result Date: 02/13/2020  Lower Venous DVT Study Other Indications: Covid-19,  Elevated D-Dimer. Comparison Study: No previous Venous duplex. Performing Technologist: Clint Guy  Examination Guidelines: A complete evaluation includes B-mode imaging, spectral Doppler, color Doppler, and power Doppler as needed of all accessible portions of each vessel. Bilateral testing is considered an integral part of a complete examination. Limited examinations for reoccurring indications may be performed as noted. The reflux portion of the exam is performed with the patient in reverse Trendelenburg.  +---------+---------------+---------+-----------+----------+--------------+ RIGHT    CompressibilityPhasicitySpontaneityPropertiesThrombus Aging +---------+---------------+---------+-----------+----------+--------------+ CFV      Full           Yes      Yes                                 +---------+---------------+---------+-----------+----------+--------------+ SFJ      Full                                                        +---------+---------------+---------+-----------+----------+--------------+ FV Prox  Full                                                        +---------+---------------+---------+-----------+----------+--------------+ FV Mid   Full                                                        +---------+---------------+---------+-----------+----------+--------------+ FV DistalFull                                                        +---------+---------------+---------+-----------+----------+--------------+ PFV      Full                                                        +---------+---------------+---------+-----------+----------+--------------+ POP      Full           Yes      Yes                                 +---------+---------------+---------+-----------+----------+--------------+ PTV      Full                                                         +---------+---------------+---------+-----------+----------+--------------+  PERO     Full                                                        +---------+---------------+---------+-----------+----------+--------------+   +---------+---------------+---------+-----------+----------+--------------+ LEFT     CompressibilityPhasicitySpontaneityPropertiesThrombus Aging +---------+---------------+---------+-----------+----------+--------------+ CFV      Full           Yes      Yes                                 +---------+---------------+---------+-----------+----------+--------------+ SFJ      Full                                                        +---------+---------------+---------+-----------+----------+--------------+ FV Prox  Full                                                        +---------+---------------+---------+-----------+----------+--------------+ FV Mid   Full                                                        +---------+---------------+---------+-----------+----------+--------------+ FV DistalFull                                                        +---------+---------------+---------+-----------+----------+--------------+ PFV      Full                                                        +---------+---------------+---------+-----------+----------+--------------+ POP      Full           Yes      Yes                                 +---------+---------------+---------+-----------+----------+--------------+ PTV      Full                                                        +---------+---------------+---------+-----------+----------+--------------+ PERO     Full                                                        +---------+---------------+---------+-----------+----------+--------------+  Summary: RIGHT: - There is no evidence of deep vein thrombosis in the lower extremity.  - No cystic structure found in  the popliteal fossa.  LEFT: - There is no evidence of deep vein thrombosis in the lower extremity.  - No cystic structure found in the popliteal fossa.  *See table(s) above for measurements and observations. Electronically signed by Fabienne Bruns MD on 02/13/2020 at 5:00:16 PM.    Final    ECHOCARDIOGRAM LIMITED  Result Date: 02/13/2020    ECHOCARDIOGRAM LIMITED REPORT   Patient Name:   Kristin Cook Date of Exam: 02/13/2020 Medical Rec #:  [id number]        Height:       61.0 in Accession #:    [id number]       Weight:       138.6 lb Date of Birth:  07-Jul-1931        BSA:          1.616 m Patient Age:    88 years         BP:           157/74 mmHg Patient Gender: F                HR:           85 bpm. Exam Location:  Inpatient Procedure: Limited Echo, Cardiac Doppler and Color Doppler Indications:    CHF  History:        Patient has no prior history of Echocardiogram examinations.                 Signs/Symptoms:Dementia; Risk Factors:Hypertension,                 Dyslipidemia, Diabetes and LE edema. T12 compression fracture,                 COVID+.  Sonographer:    Lavenia Atlas Referring Phys: [id number] JENNIFER YATES  Sonographer Comments: Back fracture. IMPRESSIONS  1. Left ventricular ejection fraction, by estimation, is 65 to 70%. The left ventricle has normal function. The left ventricle has no regional wall motion abnormalities. There is moderate concentric left ventricular hypertrophy. Left ventricular diastolic function could not be evaluated. There is the interventricular septum is flattened in systole, consistent with right ventricular pressure overload.  2. Right ventricular systolic function is normal. The right ventricular size is moderately enlarged. There is severely elevated pulmonary artery systolic pressure. The estimated right ventricular systolic pressure is 65.5 mmHg.  3. The mitral valve is degenerative. Trivial mitral valve regurgitation. Mild mitral stenosis. The mean mitral valve  gradient is 5.9 mmHg with average heart rate of 85 bpm. Moderate to severe mitral annular calcification.  4. The tricuspid valve is abnormal. Tricuspid valve regurgitation is moderate.  5. The aortic valve is tricuspid. There is mild calcification of the aortic valve. There is mild thickening of the aortic valve. Aortic valve regurgitation is not visualized. Mild aortic valve sclerosis is present, with no evidence of aortic valve stenosis.  6. The inferior vena cava is normal in size with <50% respiratory variability, suggesting right atrial pressure of 8 mmHg. Comparison(s): No prior Echocardiogram. Conclusion(s)/Recommendation(s): Normal LV function. Moderately dilated RV with preserved function. RVSP severely elevated ~65 mmHG. Septal flattening is present in systole consistent with RV pressure overload. FINDINGS  Left Ventricle: Left ventricular ejection fraction, by estimation, is 65 to 70%. The left ventricle has normal function. The left ventricle has no regional wall motion abnormalities. The left  ventricular internal cavity size was normal in size. There is  moderate concentric left ventricular hypertrophy. The interventricular septum is flattened in systole, consistent with right ventricular pressure overload. Left ventricular diastolic function could not be evaluated. Left ventricular diastolic function could not be evaluated due to mitral annular calcification (moderate or greater). Right Ventricle: The right ventricular size is moderately enlarged. No increase in right ventricular wall thickness. Right ventricular systolic function is normal. There is severely elevated pulmonary artery systolic pressure. The tricuspid regurgitant velocity is 3.79 m/s, and with an assumed right atrial pressure of 8 mmHg, the estimated right ventricular systolic pressure is 65.5 mmHg. Pericardium: Trivial pericardial effusion is present. Mitral Valve: The mitral valve is degenerative in appearance. There is mild  calcification of the anterior and posterior mitral valve leaflet(s). Moderate to severe mitral annular calcification. Trivial mitral valve regurgitation. Mild mitral valve stenosis. MV peak gradient, 15.4 mmHg. The mean mitral valve gradient is 5.9 mmHg with average heart rate of 85 bpm. Tricuspid Valve: The tricuspid valve is abnormal. Tricuspid valve regurgitation is moderate . No evidence of tricuspid stenosis. Aortic Valve: The aortic valve is tricuspid. There is mild calcification of the aortic valve. There is mild thickening of the aortic valve. Aortic valve regurgitation is not visualized. Mild aortic valve sclerosis is present, with no evidence of aortic valve stenosis. Aorta: The aortic root is normal in size and structure. Venous: The inferior vena cava is normal in size with less than 50% respiratory variability, suggesting right atrial pressure of 8 mmHg. LEFT VENTRICLE PLAX 2D LVIDd:         3.20 cm  Diastology LVIDs:         2.40 cm  LV e' medial:    4.35 cm/s LV PW:         1.40 cm  LV E/e' medial:  21.2 LV IVS:        1.50 cm  LV e' lateral:   3.59 cm/s LVOT diam:     2.00 cm  LV E/e' lateral: 25.7 LV SV:         61 LV SV Index:   38 LVOT Area:     3.14 cm  RIGHT VENTRICLE RV S prime:     7.83 cm/s LEFT ATRIUM         Index LA diam:    3.10 cm 1.92 cm/m  AORTIC VALVE LVOT Vmax:   106.00 cm/s LVOT Vmean:  73.100 cm/s LVOT VTI:    0.194 m  AORTA Ao Root diam: 3.00 cm MITRAL VALVE                TRICUSPID VALVE MV Area (PHT): 3.42 cm     TR Peak grad:   57.5 mmHg MV Area VTI:   1.90 cm     TR Vmax:        379.00 cm/s MV Peak grad:  15.4 mmHg MV Mean grad:  5.9 mmHg     SHUNTS MV Vmax:       1.96 m/s     Systemic VTI:  0.19 m MV Vmean:      109.0 cm/s   Systemic Diam: 2.00 cm MV VTI:        0.32 m MV Decel Time: 222 msec MV E velocity: 92.40 cm/s MV A velocity: 173.00 cm/s MV E/A ratio:  0.53 Lennie Odor MD Electronically signed by Lennie Odor MD Signature Date/Time: 02/13/2020/3:54:22 PM    Final

## 2020-02-15 NOTE — TOC Progression Note (Signed)
Transition of Care [facility]) - Progression Note    Patient Details  Name: Kristin Cook MRN: [id number] Date of Birth: 19-Dec-1931  [facility] CM/SW Contact  Mearl Latin, LCSW Phone Number: 02/15/2020, 11:07 AM  Clinical Narrative:    CSW requested [facility] review referral. Will need insurance approval.    Expected Discharge Plan: Skilled Nursing Facility Barriers to Discharge: Continued Medical Work up, English as a second language teacher, SNF Pending bed offer  Expected Discharge Plan and Services Expected Discharge Plan: Skilled Nursing Facility                                               Social Determinants of Health (SDOH) Interventions    Readmission Risk Interventions No flowsheet data found.

## 2020-02-16 ENCOUNTER — Ambulatory Visit: Payer: Medicare Other | Admitting: Family Medicine

## 2020-02-16 LAB — COMPREHENSIVE METABOLIC PANEL
ALT: 15 U/L (ref 0–44)
AST: 17 U/L (ref 15–41)
Albumin: 2.9 g/dL — ABNORMAL LOW (ref 3.5–5.0)
Alkaline Phosphatase: 76 U/L (ref 38–126)
Anion gap: 10 (ref 5–15)
BUN: 41 mg/dL — ABNORMAL HIGH (ref 8–23)
CO2: 27 mmol/L (ref 22–32)
Calcium: 9.5 mg/dL (ref 8.9–10.3)
Chloride: 99 mmol/L (ref 98–111)
Creatinine, Ser: 0.81 mg/dL (ref 0.44–1.00)
GFR, Estimated: >60 mL/min (ref >60)
Glucose, Bld: 136 mg/dL — ABNORMAL HIGH (ref 70–99)
Potassium: 4.2 mmol/L (ref 3.5–5.1)
Sodium: 136 mmol/L (ref 135–145)
Total Bilirubin: 0.4 mg/dL (ref 0.3–1.2)
Total Protein: 5.6 g/dL — ABNORMAL LOW (ref 6.5–8.1)

## 2020-02-16 LAB — CBC WITH DIFFERENTIAL/PLATELET
Abs Immature Granulocytes: 0.05 10*3/uL (ref 0.00–0.07)
Basophils Absolute: 0 10*3/uL (ref 0.0–0.1)
Basophils Relative: 0 %
Eosinophils Absolute: 0 10*3/uL (ref 0.0–0.5)
Eosinophils Relative: 0 %
HCT: 41.2 % (ref 36.0–46.0)
Hemoglobin: 14.3 g/dL (ref 12.0–15.0)
Immature Granulocytes: 1 %
Lymphocytes Relative: 17 %
Lymphs Abs: 1.5 10*3/uL (ref 0.7–4.0)
MCH: 30.2 pg (ref 26.0–34.0)
MCHC: 34.7 g/dL (ref 30.0–36.0)
MCV: 86.9 fL (ref 80.0–100.0)
Monocytes Absolute: 0.9 10*3/uL (ref 0.1–1.0)
Monocytes Relative: 10 %
Neutro Abs: 6.5 10*3/uL (ref 1.7–7.7)
Neutrophils Relative %: 72 %
Platelets: 302 10*3/uL (ref 150–400)
RBC: 4.74 MIL/uL (ref 3.87–5.11)
RDW: 12.9 % (ref 11.5–15.5)
WBC: 9 10*3/uL (ref 4.0–10.5)
nRBC: 0 % (ref 0.0–0.2)

## 2020-02-16 LAB — GLUCOSE, CAPILLARY
Glucose-Capillary: 170 mg/dL — ABNORMAL HIGH (ref 70–99)
Glucose-Capillary: 244 mg/dL — ABNORMAL HIGH (ref 70–99)
Glucose-Capillary: 177 mg/dL — ABNORMAL HIGH (ref 70–99)
Glucose-Capillary: 289 mg/dL — ABNORMAL HIGH (ref 70–99)

## 2020-02-16 LAB — MAGNESIUM: Magnesium: 2.2 mg/dL (ref 1.7–2.4)

## 2020-02-16 LAB — PHOSPHORUS: Phosphorus: 3.5 mg/dL (ref 2.5–4.6)

## 2020-02-16 LAB — D-DIMER, QUANTITATIVE: D-Dimer, Quant: 0.99 ug/mL-FEU — ABNORMAL HIGH (ref 0.00–0.50)

## 2020-02-16 LAB — C-REACTIVE PROTEIN: CRP: 0.8 mg/dL (ref <1.0)

## 2020-02-16 MED ORDER — DEXAMETHASONE SODIUM PHOSPHATE 4 MG/ML IJ SOLN
4.0000 mg | Freq: Every day | INTRAMUSCULAR | Status: DC
  Administered 2020-02-17 – 2020-02-18 (×2): 4 mg via INTRAVENOUS
  Filled 2020-02-16 (×2): qty 1

## 2020-02-16 NOTE — Progress Notes (Signed)
PROGRESS NOTE                                                                                                                                                                                                             Patient Demographics:    Kristin Cook, is a 86 y.o. female, DOB - 1932/01/28, ZOX:[id number]  Outpatient Primary MD for the patient is Ronnald Nian, MD   Admit date - 02/12/2020   LOS - 4  Chief Complaint  Patient presents with  . Back Pain       Brief Narrative: Patient is a 86 y.o. female with PMHx of HTN, HLD, DM-2-presented with back pain-imaging studies showed a T12 compression fracture-evaluated by neurosurgery who recommended TLSO brace/PT/OT-subsequently seen by IR-and underwent kyphoplasty.  She was also found to have COVID-19 infection on initial presentation.  See below for further details.  COVID-19 vaccinated status: Vaccinated  Significant Events: 12/5>> Admit to [facility] for back pain due to T12 compression fracture, COVID-19 infection.  Significant studies: 12/5>> CT abdomen/pelvis: Acute/subacute compression deformity of T12 vertebra, mosaic attenuation of the lung bases/subpleural reticular opacities 12/5>> chest x-ray: Interstitial opacities bilaterally 12/6>> Echo: EF 65-70%, RVSP 65.5 mmHg. 12/6>> bilateral lower extremity Doppler: No DVT  COVID-19 medications: Steroids: 12/5>> Remdesivir: 12/5>>12/9  Antibiotics: None  Microbiology data: None  Procedures: 12/7>>Kyphoplasty  Consults: IR, neurosurgery  DVT prophylaxis: enoxaparin (LOVENOX) injection 40 mg Start: 02/15/20 1000    Subjective:    Kristin Cook today was lying comfortably in bed-minimal back pain.   Assessment  & Plan :   T12 compression fracture: Underwent kyphoplasty by IR on 12/7-admitting physician also discussed with neurosurgery on-call.  Continue PT/OT-social worker following for SNF  placement.  Acute Hypoxic Resp Failure due to Covid 19 Viral pneumonia: Appears to have mild disease-minimally hypoxic this morning-on room air this morning-completed Remdesivir 12/9-continue steroids for a few more days.  Fever: afebrile O2 requirements:  SpO2: 99 % O2 Flow Rate (L/min): 1 L/min   COVID-19 Labs: Recent Labs    02/14/20 0838 02/15/20 0220 02/16/20 0039  DDIMER 1.12* 1.50* 0.99*  CRP 0.7 0.7 0.8       Component Value Date/Time   BNP 234.2 (H) 02/12/2020 1208    No results for input(s): PROCALCITON in the last 168 hours.  Lab Results  Component Value Date  SARSCOV2NAA POSITIVE (A) 02/12/2020     Prone/Incentive Spirometry: encouraged  incentive spirometry use 3-4/hour.  Acute metabolic encephalopathy superimposed on mild dementia: Significantly improved-awake/alert this morning.  Expect some amount of delirium during this hospital stay.  Maintain delirium precautions  Possible underlying interstitial lung disease: See above imaging studies-Dr. Elgergawy-discussed with pulmonology-recommendations are for outpatient follow-up with pulmonology to repeat imaging/HRCT.  Pulmonary hypertension: Likely a sequelae of underlying interstitial lung disease-incidental finding-stable for outpatient follow-up with pulmonology/cardiology.  HTN: BP reasonably controlled-continue amlodipine, HCTZ/lisinopril and follow.  HLD: Continue statin  DM-2 (A1c 9.0 on 01/18/2020) with steroid-induced hyperglycemia: CBGs remain stable-continue SSI-suspect this usually will improve as steroid dose is tapered down.  Plan is to resume oral hypoglycemic agents on discharge.    Recent Labs    02/15/20 2104 02/16/20 0815 02/16/20 1225  GLUCAP 209* 170* 244*   ABG: No results found for: PHART, PCO2ART, PO2ART, HCO3, TCO2, ACIDBASEDEF, O2SAT  Vent Settings: N/A    Condition -Stable  Family Communication  :  Son [name] [phone]) updated over the phone on 12/9  Code Status  :DNR  Diet :  Diet Order            Diet heart healthy/carb modified Room service appropriate? Yes; Fluid consistency: Thin  Diet effective now                  Disposition Plan  :   Status is: Inpatient  Remains inpatient appropriate because:Inpatient level of care appropriate due to severity of illness   Dispo: The patient is from: Home              Anticipated d/c is to: SNF              Anticipated d/c date is: 1 day              Patient currently is not medically stable to d/c.   Barriers to discharge: Hypoxia requiring O2 supplementation/complete 5 days of IV Remdesivir  Antimicorbials  :    Anti-infectives (From admission, onward)   Start     Dose/Rate Route Frequency Ordered Stop   02/14/20 1438  tobramycin (NEBCIN) 1.2 g powder       Note to Pharmacy: Daleen Snook   : cabinet override      02/14/20 1438 02/15/20 0244   02/14/20 1415  vancomycin (VANCOCIN) IVPB 1000 mg/200 mL premix        1,000 mg 200 mL/hr over 60 Minutes Intravenous  Once 02/14/20 1406 02/14/20 1540   02/13/20 1000  remdesivir 100 mg in sodium chloride 0.9 % 100 mL IVPB       "Followed by" Linked Group Details   100 mg 200 mL/hr over 30 Minutes Intravenous Daily 02/12/20 2243 02/16/20 1116   02/12/20 2330  remdesivir 200 mg in sodium chloride 0.9% 250 mL IVPB       "Followed by" Linked Group Details   200 mg 580 mL/hr over 30 Minutes Intravenous Once 02/12/20 2243 02/13/20 0105      Inpatient Medications  Scheduled Meds: . albuterol  2 puff Inhalation Q6H  . amLODipine  10 mg Oral Daily  . vitamin C  500 mg Oral Daily  . calcium-vitamin D  1 tablet Oral BID  . dexamethasone (DECADRON) injection  6 mg Intravenous Daily  . docusate sodium  100 mg Oral BID  . enoxaparin (LOVENOX) injection  40 mg Subcutaneous Daily  . lisinopril  20 mg Oral Daily   And  .  hydrochlorothiazide  12.5 mg Oral Daily  . insulin aspart  0-15 Units Subcutaneous TID WC  . insulin aspart  0-5 Units  Subcutaneous QHS  . lidocaine  1 patch Transdermal Q24H  . nicotine  14 mg Transdermal Daily  . rosuvastatin  20 mg Oral Daily  . senna-docusate  2 tablet Oral BID  . sodium chloride flush  3 mL Intravenous Q12H  . zinc sulfate  220 mg Oral Daily   Continuous Infusions:  PRN Meds:.acetaminophen **OR** acetaminophen, bisacodyl, guaiFENesin-dextromethorphan, hydrALAZINE, morphine injection, ondansetron **OR** ondansetron (ZOFRAN) IV, oxyCODONE, polyethylene glycol   Time Spent in minutes  25  See all Orders from today for further details   Jeoffrey Massed M.D on 02/16/2020 at 2:18 PM  To page go to [email or web address] - use universal password  [facility] -  Office  [phone]    Objective:   Vitals:   02/15/20 2109 02/16/20 0505 02/16/20 0900 02/16/20 1028  BP: 129/67 (!) 160/76  (!) 148/62  Pulse: 64 68    Resp: (!) 21 20    Temp: 98.1 F (36.7 C) 97.7 F (36.5 C)    TempSrc: Oral Oral    SpO2: 100% 100% 99%   Weight:      Height:        Wt Readings from Last 3 Encounters:  02/14/20 61.6 kg  01/18/20 62.9 kg  09/13/19 63.4 kg     Intake/Output Summary (Last 24 hours) at 02/16/2020 1418 Last data filed at 02/16/2020 1322 Gross per 24 hour  Intake 100 ml  Output 450 ml  Net -350 ml     Physical Exam Gen Exam:Alert awake-not in any distress HEENT:atraumatic, normocephalic Chest: B/L clear to auscultation anteriorly CVS:S1S2 regular Abdomen:soft non tender, non distended Extremities:no edema Neurology: Non focal Skin: no rash   Data Review:    CBC Recent Labs  Lab 02/12/20 1208 02/13/20 0537 02/14/20 0838 02/15/20 0220 02/16/20 0039  WBC 6.3 6.3 9.9 9.4 9.0  HGB 13.7 15.0 14.3 13.4 14.3  HCT 41.6 45.9 43.3 39.9 41.2  PLT 221 248 299 299 302  MCV 89.8 89.8 90.2 87.3 86.9  MCH 29.6 29.4 29.8 29.3 30.2  MCHC 32.9 32.7 33.0 33.6 34.7  RDW 12.9 12.8 12.6 12.8 12.9  LYMPHSABS 0.6* 0.5* 1.4 1.3 1.5  MONOABS 0.6 0.2 0.8 0.9 0.9  EOSABS 0.1  0.0 0.0 0.0 0.0  BASOSABS 0.0 0.0 0.0 0.0 0.0    Chemistries  Recent Labs  Lab 02/12/20 1208 02/13/20 0537 02/14/20 0838 02/15/20 0220 02/16/20 0039  NA 134* 135 137 135 136  K 4.1 4.1 3.1* 3.9 4.2  CL 99 96* 99 98 99  CO2 24 25 22 25 27   GLUCOSE 246* 257* 178* 226* 136*  BUN 12 18 27* 42* 41*  CREATININE 0.80 0.99 0.71 0.84 0.81  CALCIUM 8.8* 8.8* 9.6 9.2 9.5  MG  --  1.7 1.9 1.6* 2.2  AST 13* 14* 13* 13* 17  ALT 14 14 12 11 15   ALKPHOS 82 87 67 72 76  BILITOT 1.0 0.9 1.0 0.7 0.4   ------------------------------------------------------------------------------------------------------------------ No results for input(s): CHOL, HDL, LDLCALC, TRIG, CHOLHDL, LDLDIRECT in the last 72 hours.  Lab Results  Component Value Date   HGBA1C 9.0 (A) 01/18/2020   ------------------------------------------------------------------------------------------------------------------ No results for input(s): TSH, T4TOTAL, T3FREE, THYROIDAB in the last 72 hours.  Invalid input(s): FREET3 ------------------------------------------------------------------------------------------------------------------ No results for input(s): VITAMINB12, FOLATE, FERRITIN, TIBC, IRON, RETICCTPCT in the last 72 hours.  Coagulation profile Recent Labs  Lab 02/14/20 1814  INR 1.1    Recent Labs    02/15/20 0220 02/16/20 0039  DDIMER 1.50* 0.99*    Cardiac Enzymes No results for input(s): CKMB, TROPONINI, MYOGLOBIN in the last 168 hours.  Invalid input(s): CK ------------------------------------------------------------------------------------------------------------------    Component Value Date/Time   BNP 234.2 (H) 02/12/2020 1208    Micro Results Recent Results (from the past 240 hour(s))  Resp Panel by RT-PCR (Flu A&B, Covid) Nasopharyngeal Swab     Status: Abnormal   Collection Time: 02/12/20  8:30 PM   Specimen: Nasopharyngeal Swab; Nasopharyngeal(NP) swabs in vial transport medium   Result Value Ref Range Status   SARS Coronavirus 2 by RT PCR POSITIVE (A) NEGATIVE Final    Comment: RESULT CALLED TO, READ BACK BY AND VERIFIED WITH: M. CARDUFF,RN 2203 02/12/2020 T. TYSOR (NOTE) SARS-CoV-2 target nucleic acids are DETECTED.  The SARS-CoV-2 RNA is generally detectable in upper respiratory specimens during the acute phase of infection. Positive results are indicative of the presence of the identified virus, but do not rule out bacterial infection or co-infection with other pathogens not detected by the test. Clinical correlation with patient history and other diagnostic information is necessary to determine patient infection status. The expected result is Negative.  Fact Sheet for Patients: BloggerCourse.com  Fact Sheet for Healthcare Providers: SeriousBroker.it  This test is not yet approved or cleared by the [place] FDA and  has been authorized for detection and/or diagnosis of SARS-CoV-2 by FDA under an Emergency Use Authorization (EUA).  This EUA will remain in effect (meaning this test c an be used) for the duration of  the COVID-19 declaration under Section 564(b)(1) of the Act, 21 U.S.C. section 360bbb-3(b)(1), unless the authorization is terminated or revoked sooner.     Influenza A by PCR NEGATIVE NEGATIVE Final   Influenza B by PCR NEGATIVE NEGATIVE Final    Comment: (NOTE) The Xpert Xpress SARS-CoV-2/FLU/RSV plus assay is intended as an aid in the diagnosis of influenza from Nasopharyngeal swab specimens and should not be used as a sole basis for treatment. Nasal washings and aspirates are unacceptable for Xpert Xpress SARS-CoV-2/FLU/RSV testing.  Fact Sheet for Patients: BloggerCourse.com  Fact Sheet for Healthcare Providers: SeriousBroker.it  This test is not yet approved or cleared by the [place] FDA and has been authorized for  detection and/or diagnosis of SARS-CoV-2 by FDA under an Emergency Use Authorization (EUA). This EUA will remain in effect (meaning this test can be used) for the duration of the COVID-19 declaration under Section 564(b)(1) of the Act, 21 U.S.C. section 360bbb-3(b)(1), unless the authorization is terminated or revoked.  Performed at [facility], [address], [place], [place] [address]     Radiology Reports MR LUMBAR SPINE WO CONTRAST  Result Date: 02/13/2020 CLINICAL DATA:  Acute presentation with back pain over the last few days. EXAM: MRI LUMBAR SPINE WITHOUT CONTRAST TECHNIQUE: Multiplanar, multisequence MR imaging of the lumbar spine was performed. No intravenous contrast was administered. COMPARISON:  CT abdomen yesterday. FINDINGS: Segmentation:  5 lumbar type vertebral bodies. Alignment: Thoracolumbar curvature convex to the right and lumbar curvature convex to the left. Vertebrae: Acute or subacute superior endplate fracture at T12 with loss of height of 10%. No retropulsed bone. Discogenic degenerative endplate changes on the right at L2-3 and L3-4. Conus medullaris and cauda equina: Conus extends to the L1-2 level. Conus and cauda equina appear normal. Paraspinal and other soft tissues: Negative Disc levels: L1-2: Minimal disc bulge.  No stenosis or neural compression. L2-3: Disc degeneration more pronounced on the right. Endplate osteophytes and mild bulging of the disc. No compressive canal or foraminal narrowing. L3-4: Disc degeneration more pronounced on the right. Endplate osteophytes and shallow protrusion of the disc. Facet and ligamentous hypertrophy more prominent on the right. Mild stenosis of the right lateral recess and intervertebral foramen on the right that could possibly cause right-sided neural compressive symptoms. L4-5: Mild bulging of the disc.  No stenosis. L5-S1: Minimal bulging of the disc.  No stenosis. IMPRESSION: 1. Acute or subacute superior endplate  fracture at T12 with loss of height of 10%. No retropulsed bone. This looks like a benign osteoporotic fracture. 2. Thoracolumbar curvature convex to the right and lumbar curvature convex to the left. 3. Right-sided predominant degenerative changes at L2-3 and L3-4 with right lateral recess and foraminal narrowing that could possibly cause right-sided neural compressive symptoms at the L3-4 level. Electronically Signed   By: Paulina Fusi M.D.   On: 02/13/2020 14:06   CT ABDOMEN PELVIS W CONTRAST  Result Date: 02/12/2020 CLINICAL DATA:  Four days of worsening lower back pain EXAM: CT ABDOMEN AND PELVIS WITH CONTRAST TECHNIQUE: Multidetector CT imaging of the abdomen and pelvis was performed using the standard protocol following bolus administration of intravenous contrast. CONTRAST:  OMNIPAQUE IOHEXOL 300 MG/ML  SOLN COMPARISON:  None. FINDINGS: Lower chest: Coronary artery atherosclerosis. Calcification of the mitral annulus. Cardiac size within normal limits. No pericardial effusion. Mosaic attenuation in the lung bases as well as subpleural reticular opacities may reflect a combination of interstitial disease and small airways disease or air trapping. No pleural effusion. Hepatobiliary: Diffuse hepatic hypoattenuation compatible with hepatic steatosis with a geographic hyperattenuating region adjacent the gallbladder fossa, possibly reflective of some sparing or transient hepatic attenuation difference. No concerning focal liver lesion. Gallbladder contains several small gallstones. No pericholecystic fluid or inflammation. No biliary ductal dilatation or intraductal gallstones. Pancreas: Moderate pancreatic atrophy. No pancreatic ductal dilatation or surrounding inflammatory changes. Spleen: Normal in size. No concerning splenic lesions. Adrenals/Urinary Tract: Normal adrenal glands. Kidneys are normally located with symmetric enhancement and excretion. No suspicious renal lesion, urolithiasis or  hydronephrosis. Normal bladder. Stomach/Bowel: Small sliding-type hiatal hernia. Distal stomach and duodenum are unremarkable. No small bowel thickening or dilatation. A normal appendix is visualized. No colonic dilatation or wall thickening. Vascular/Lymphatic: Atherosclerotic calcifications within the abdominal aorta and branch vessels. No aneurysm or ectasia. No enlarged abdominopelvic lymph nodes. Reproductive: Slightly retroverted uterus. No concerning adnexal lesions. Other: No abdominopelvic free fluid or free gas. No bowel containing hernias. Musculoskeletal: Acute to subacute appearing anterior wedging compression deformity T12 with a to 40% height loss fracture line may propagate into the posterior vertebral body cortex but without discernible transosseous extension into the posterior tension band. No other acute fractures are seen. Mixed sclerotic and lucent changes are present L2-L4 centered upon focal discogenic changes at a region of maximal levocurvature in the spine. These are favored to reflect Modic type endplate changes. Sclerotic features isolated to the iliac side of the bilateral SI joints may reflect sequela of prior osteitis condensans. Findings on a background of moderate bilateral SI joint and bilateral hip osteoarthrosis including chondrocalcinosis which can reflect CPPD. IMPRESSION: 1. Acute to subacute appearing anterior wedging compression deformity T12 with a to 40% height loss anteriorly. Fracture line may propagate into the posterior vertebral body cortex but without discernible transosseous extension into the posterior tension band. 2. Cholelithiasis without evidence of acute cholecystitis. 3.  Small sliding-type hiatal hernia. 4. Mosaic attenuation in the lung bases as well as subpleural reticular opacities may reflect a combination of interstitial disease and small airways disease or air trapping. Consider further outpatient characterization with HRCT, as clinically warranted. 5.  Aortic Atherosclerosis (ICD10-I70.0). Coronary artery atherosclerosis. Electronically Signed   By: Kreg Shropshire M.D.   On: 02/12/2020 16:01   DG Chest Port 1 View  Result Date: 02/12/2020 CLINICAL DATA:  Shortness of breath. EXAM: PORTABLE CHEST 1 VIEW COMPARISON:  March 15, 2013 FINDINGS: Cardiomegaly. The hila and mediastinum are unremarkable. No pneumothorax. Increased interstitial opacities, new in the interval. No other acute abnormalities. IMPRESSION: Findings are most suggestive of cardiomegaly and mild edema. Electronically Signed   By: Gerome Sam III M.D   On: 02/12/2020 12:39   IR KYPHO LUMBAR INC FX REDUCE BONE BX UNI/BIL CANNULATION INC/IMAGING  Result Date: 02/15/2020 INDICATION: Severe low back pain secondary to compression fracture at T12. EXAM: BALLOON KYPHOPLASTY T12 COMPARISON:  MRI lumbosacral spine of February 13, 2020. MEDICATIONS: As antibiotic prophylaxis, Ancef 2 g IV was ordered pre-procedure and administered intravenously within 1 hour of incision. ANESTHESIA/SEDATION: Moderate (conscious) sedation was employed during this procedure. A total of Versed 1.5 mg and Fentanyl 50 mcg was administered intravenously. Moderate Sedation Time: 28 minutes. The patient's level of consciousness and vital signs were monitored continuously by radiology nursing throughout the procedure under my direct supervision. FLUOROSCOPY TIME:  Fluoroscopy Time: 10 minutes 36 seconds (557 mGy) COMPLICATIONS: None immediate. PROCEDURE: Following a full explanation of the procedure along with the potential associated complications, an informed witnessed consent was obtained. The patient was placed prone on the fluoroscopic table. The skin overlying the thoracolumbar region was then prepped and draped in the usual sterile fashion. The right pedicle at T12 was then infiltrated with 0.25% bupivacaine followed by the advancement of an 11-gauge Jamshidi needle through the right pedicle into the posterior  one-third at T12. This was then exchanged for a Kyphon advanced osteo introducer system comprised of a working cannula and a Kyphon osteo drill. This combination was then advanced over a Kyphon osteo bone pin until the tip of the Kyphon osteo drill was in the posterior third at T12. At this time, the bone pin was removed. In a medial trajectory, the combination was advanced until the tip of the working cannula was inside the posterior one-third at T12. The osteo drill was removed. Through the working cannula, a Kyphon inflatable bone tamp 20 x 3 was advanced and positioned with the distal marker 5 mm from the anterior aspect of T12. Crossing of the midline was seen on the AP projection. At this time, the balloon was expanded using contrast via a Kyphon inflation syringe device via microtubing. Inflations were continued until there was apposition with the superior endplate. At this time, methylmethacrylate mixture was reconstituted with Tobramycin in the Kyphon bone mixing device system. This was then loaded onto the Kyphon bone fillers. The balloon was deflated and removed followed by the instillation of 4 bone filler equivalents of methylmethacrylate mixture at T12 with excellent filling in the AP and lateral projections. No extravasation was noted in the disk spaces or posteriorly into the spinal canal. A minute amount of methylmethacrylate mixture was seen in adjacent paraspinous vein, where it remained stable throughout the procedure. The working cannula and the bone filler were then retrieved and removed. Hemostasis was achieved at the skin entry site. Patient was then returned to the floor in a stable condition. IMPRESSION:  1. Status post vertebral body augmentation using balloon kyphoplasty at T12 as described without event. Electronically Signed   By: Julieanne Cotton M.D.   On: 02/14/2020 17:13   VAS [place] LOWER EXTREMITY VENOUS (DVT)  Result Date: 02/13/2020  Lower Venous DVT Study Other Indications:  Covid-19, Elevated D-Dimer. Comparison Study: No previous Venous duplex. Performing Technologist: Clint Guy  Examination Guidelines: A complete evaluation includes B-mode imaging, spectral Doppler, color Doppler, and power Doppler as needed of all accessible portions of each vessel. Bilateral testing is considered an integral part of a complete examination. Limited examinations for reoccurring indications may be performed as noted. The reflux portion of the exam is performed with the patient in reverse Trendelenburg.  +---------+---------------+---------+-----------+----------+--------------+ RIGHT    CompressibilityPhasicitySpontaneityPropertiesThrombus Aging +---------+---------------+---------+-----------+----------+--------------+ CFV      Full           Yes      Yes                                 +---------+---------------+---------+-----------+----------+--------------+ SFJ      Full                                                        +---------+---------------+---------+-----------+----------+--------------+ FV Prox  Full                                                        +---------+---------------+---------+-----------+----------+--------------+ FV Mid   Full                                                        +---------+---------------+---------+-----------+----------+--------------+ FV DistalFull                                                        +---------+---------------+---------+-----------+----------+--------------+ PFV      Full                                                        +---------+---------------+---------+-----------+----------+--------------+ POP      Full           Yes      Yes                                 +---------+---------------+---------+-----------+----------+--------------+ PTV      Full                                                         +---------+---------------+---------+-----------+----------+--------------+  PERO     Full                                                        +---------+---------------+---------+-----------+----------+--------------+   +---------+---------------+---------+-----------+----------+--------------+ LEFT     CompressibilityPhasicitySpontaneityPropertiesThrombus Aging +---------+---------------+---------+-----------+----------+--------------+ CFV      Full           Yes      Yes                                 +---------+---------------+---------+-----------+----------+--------------+ SFJ      Full                                                        +---------+---------------+---------+-----------+----------+--------------+ FV Prox  Full                                                        +---------+---------------+---------+-----------+----------+--------------+ FV Mid   Full                                                        +---------+---------------+---------+-----------+----------+--------------+ FV DistalFull                                                        +---------+---------------+---------+-----------+----------+--------------+ PFV      Full                                                        +---------+---------------+---------+-----------+----------+--------------+ POP      Full           Yes      Yes                                 +---------+---------------+---------+-----------+----------+--------------+ PTV      Full                                                        +---------+---------------+---------+-----------+----------+--------------+ PERO     Full                                                        +---------+---------------+---------+-----------+----------+--------------+  Summary: RIGHT: - There is no evidence of deep vein thrombosis in the lower extremity.  - No cystic structure found in  the popliteal fossa.  LEFT: - There is no evidence of deep vein thrombosis in the lower extremity.  - No cystic structure found in the popliteal fossa.  *See table(s) above for measurements and observations. Electronically signed by Fabienne Bruns MD on 02/13/2020 at 5:00:16 PM.    Final    ECHOCARDIOGRAM LIMITED  Result Date: 02/13/2020    ECHOCARDIOGRAM LIMITED REPORT   Patient Name:   Kristin Cook Date of Exam: 02/13/2020 Medical Rec #:  [id number]        Height:       61.0 in Accession #:    [id number]       Weight:       138.6 lb Date of Birth:  07-Jul-1931        BSA:          1.616 m Patient Age:    88 years         BP:           157/74 mmHg Patient Gender: F                HR:           85 bpm. Exam Location:  Inpatient Procedure: Limited Echo, Cardiac Doppler and Color Doppler Indications:    CHF  History:        Patient has no prior history of Echocardiogram examinations.                 Signs/Symptoms:Dementia; Risk Factors:Hypertension,                 Dyslipidemia, Diabetes and LE edema. T12 compression fracture,                 COVID+.  Sonographer:    Lavenia Atlas Referring Phys: [id number] JENNIFER YATES  Sonographer Comments: Back fracture. IMPRESSIONS  1. Left ventricular ejection fraction, by estimation, is 65 to 70%. The left ventricle has normal function. The left ventricle has no regional wall motion abnormalities. There is moderate concentric left ventricular hypertrophy. Left ventricular diastolic function could not be evaluated. There is the interventricular septum is flattened in systole, consistent with right ventricular pressure overload.  2. Right ventricular systolic function is normal. The right ventricular size is moderately enlarged. There is severely elevated pulmonary artery systolic pressure. The estimated right ventricular systolic pressure is 65.5 mmHg.  3. The mitral valve is degenerative. Trivial mitral valve regurgitation. Mild mitral stenosis. The mean mitral valve  gradient is 5.9 mmHg with average heart rate of 85 bpm. Moderate to severe mitral annular calcification.  4. The tricuspid valve is abnormal. Tricuspid valve regurgitation is moderate.  5. The aortic valve is tricuspid. There is mild calcification of the aortic valve. There is mild thickening of the aortic valve. Aortic valve regurgitation is not visualized. Mild aortic valve sclerosis is present, with no evidence of aortic valve stenosis.  6. The inferior vena cava is normal in size with <50% respiratory variability, suggesting right atrial pressure of 8 mmHg. Comparison(s): No prior Echocardiogram. Conclusion(s)/Recommendation(s): Normal LV function. Moderately dilated RV with preserved function. RVSP severely elevated ~65 mmHG. Septal flattening is present in systole consistent with RV pressure overload. FINDINGS  Left Ventricle: Left ventricular ejection fraction, by estimation, is 65 to 70%. The left ventricle has normal function. The left ventricle has no regional wall motion abnormalities. The left  ventricular internal cavity size was normal in size. There is  moderate concentric left ventricular hypertrophy. The interventricular septum is flattened in systole, consistent with right ventricular pressure overload. Left ventricular diastolic function could not be evaluated. Left ventricular diastolic function could not be evaluated due to mitral annular calcification (moderate or greater). Right Ventricle: The right ventricular size is moderately enlarged. No increase in right ventricular wall thickness. Right ventricular systolic function is normal. There is severely elevated pulmonary artery systolic pressure. The tricuspid regurgitant velocity is 3.79 m/s, and with an assumed right atrial pressure of 8 mmHg, the estimated right ventricular systolic pressure is 65.5 mmHg. Pericardium: Trivial pericardial effusion is present. Mitral Valve: The mitral valve is degenerative in appearance. There is mild  calcification of the anterior and posterior mitral valve leaflet(s). Moderate to severe mitral annular calcification. Trivial mitral valve regurgitation. Mild mitral valve stenosis. MV peak gradient, 15.4 mmHg. The mean mitral valve gradient is 5.9 mmHg with average heart rate of 85 bpm. Tricuspid Valve: The tricuspid valve is abnormal. Tricuspid valve regurgitation is moderate . No evidence of tricuspid stenosis. Aortic Valve: The aortic valve is tricuspid. There is mild calcification of the aortic valve. There is mild thickening of the aortic valve. Aortic valve regurgitation is not visualized. Mild aortic valve sclerosis is present, with no evidence of aortic valve stenosis. Aorta: The aortic root is normal in size and structure. Venous: The inferior vena cava is normal in size with less than 50% respiratory variability, suggesting right atrial pressure of 8 mmHg. LEFT VENTRICLE PLAX 2D LVIDd:         3.20 cm  Diastology LVIDs:         2.40 cm  LV e' medial:    4.35 cm/s LV PW:         1.40 cm  LV E/e' medial:  21.2 LV IVS:        1.50 cm  LV e' lateral:   3.59 cm/s LVOT diam:     2.00 cm  LV E/e' lateral: 25.7 LV SV:         61 LV SV Index:   38 LVOT Area:     3.14 cm  RIGHT VENTRICLE RV S prime:     7.83 cm/s LEFT ATRIUM         Index LA diam:    3.10 cm 1.92 cm/m  AORTIC VALVE LVOT Vmax:   106.00 cm/s LVOT Vmean:  73.100 cm/s LVOT VTI:    0.194 m  AORTA Ao Root diam: 3.00 cm MITRAL VALVE                TRICUSPID VALVE MV Area (PHT): 3.42 cm     TR Peak grad:   57.5 mmHg MV Area VTI:   1.90 cm     TR Vmax:        379.00 cm/s MV Peak grad:  15.4 mmHg MV Mean grad:  5.9 mmHg     SHUNTS MV Vmax:       1.96 m/s     Systemic VTI:  0.19 m MV Vmean:      109.0 cm/s   Systemic Diam: 2.00 cm MV VTI:        0.32 m MV Decel Time: 222 msec MV E velocity: 92.40 cm/s MV A velocity: 173.00 cm/s MV E/A ratio:  0.53 Lennie Odor MD Electronically signed by Lennie Odor MD Signature Date/Time: 02/13/2020/3:54:22 PM    Final

## 2020-02-16 NOTE — TOC Progression Note (Addendum)
Transition of Care [facility]) - Progression Note    Patient Details  Name: Kristin Cook MRN: [id number] Date of Birth: [birth year]  [facility]) CM/SW Contact  Mearl Latin, LCSW Phone Number: 02/16/2020, 8:41 AM  Clinical Narrative:    8:41am-Per [facility], they are unwilling to accept patient due to her history of smoking. Admissions coordinator to ask their Administrator to make sure they cannot make an exception, as patient is not allowed to smoke at any SNF. CSW updated patient's son.  2pm-[facility] able to accept patient. CSW started insurance process.   Expected Discharge Plan: Skilled Nursing Facility Barriers to Discharge: Continued Medical Work up,Insurance Authorization,SNF Pending bed offer  Expected Discharge Plan and Services Expected Discharge Plan: Skilled Nursing Facility                                               Social Determinants of Health (SDOH) Interventions    Readmission Risk Interventions No flowsheet data found.

## 2020-02-17 LAB — GLUCOSE, CAPILLARY
Glucose-Capillary: 182 mg/dL — ABNORMAL HIGH (ref 70–99)
Glucose-Capillary: 391 mg/dL — ABNORMAL HIGH (ref 70–99)
Glucose-Capillary: 244 mg/dL — ABNORMAL HIGH (ref 70–99)
Glucose-Capillary: 177 mg/dL — ABNORMAL HIGH (ref 70–99)

## 2020-02-17 MED ORDER — ALBUTEROL SULFATE HFA 108 (90 BASE) MCG/ACT IN AERS: 2.0000 | INHALATION_SPRAY | Freq: Four times a day (QID) | RESPIRATORY_TRACT | Status: DC | PRN

## 2020-02-17 MED ORDER — DEXAMETHASONE 4 MG PO TABS: 4.0000 mg | ORAL_TABLET | Freq: Every day | ORAL | Status: AC

## 2020-02-17 MED ORDER — ACETAMINOPHEN 325 MG PO TABS: 650.0000 mg | ORAL_TABLET | Freq: Four times a day (QID) | ORAL | Status: DC | PRN

## 2020-02-17 MED ORDER — SENNOSIDES-DOCUSATE SODIUM 8.6-50 MG PO TABS
2.0000 | ORAL_TABLET | Freq: Two times a day (BID) | ORAL | Status: DC
Start: 2020-02-17 — End: 2020-05-25

## 2020-02-17 MED ORDER — POLYETHYLENE GLYCOL 3350 17 G PO PACK: 17.0000 g | PACK | Freq: Every day | ORAL | 0 refills | Status: DC | PRN

## 2020-02-17 MED ORDER — CALCIUM CARBONATE-VITAMIN D 500-200 MG-UNIT PO TABS
1.0000 | ORAL_TABLET | Freq: Two times a day (BID) | ORAL | Status: DC
Start: 2020-02-17 — End: 2020-05-25

## 2020-02-17 MED ORDER — INSULIN ASPART 100 UNIT/ML ~~LOC~~ SOLN: SUBCUTANEOUS | 11 refills | Status: DC

## 2020-02-17 MED ORDER — OXYCODONE HCL 5 MG PO TABS: 5.0000 mg | ORAL_TABLET | Freq: Four times a day (QID) | ORAL | 0 refills | Status: AC | PRN

## 2020-02-17 NOTE — TOC Transition Note (Signed)
Transition of Care [facility]) - CM/SW Discharge Note   Patient Details  Name: Kristin Cook MRN: [id number] Date of Birth: March 18, 1931  [facility]) CM/SW Contact:  Mearl Latin, LCSW Phone Number: 02/17/2020, 12:41 PM   Clinical Narrative:    Patient will DC to: [facility]  Anticipated DC date: 02/17/20 Family notified: Son, [name] Transport by: Dayna Barker   Per MD patient ready for DC to [facility]. RN to call report prior to discharge [phone] Room 806A). RN, patient, patient's family, and facility notified of DC. Discharge Summary and FL2 sent to facility. DC packet on chart. Ambulance transport requested for patient.   CSW will sign off for now as social work intervention is no longer needed. Please consult [place] again if new needs arise.      Final next level of care: Skilled Nursing Facility Barriers to Discharge: Barriers Resolved   Patient Goals and CMS Choice Patient states their goals for this hospitalization and ongoing recovery are:: Rehab CMS Medicare.gov Compare Post Acute Care list provided to:: Other (Comment Required) (Patient's son [name]) Choice offered to / list presented to : Adult Children  Discharge Placement   Existing PASRR number confirmed : 02/17/20          Patient chooses bed at: [facility] Patient to be transferred to facility by: [facility] Name of family member notified: [name], son Patient and family notified of of transfer: 02/17/20  Discharge Plan and Services                                     Social Determinants of Health (SDOH) Interventions     Readmission Risk Interventions No flowsheet data found.

## 2020-02-17 NOTE — Progress Notes (Signed)
   02/17/20 0258  Assess: MEWS Score  Temp (!) 97.5 F (36.4 C)  BP (!) 167/83  Pulse Rate 69  ECG Heart Rate 68  Resp (!) 28  SpO2 100 %  Assess: MEWS Score  MEWS Temp 0  MEWS Systolic 0  MEWS Pulse 0  MEWS RR 2  MEWS LOC 0  MEWS Score 2  MEWS Score Color Yellow  Assess: if the MEWS score is Yellow or Red  Were vital signs taken at a resting state? Yes  Focused Assessment No change from prior assessment  Early Detection of Sepsis Score *See Row Information* Low  MEWS guidelines implemented *See Row Information* Yes  Treat  Pain Scale 0-10  Pain Score 0  Take Vital Signs  Increase Vital Sign Frequency  Yellow: Q 2hr X 2 then Q 4hr X 2, if remains yellow, continue Q 4hrs  Escalate  MEWS: Escalate Yellow: discuss with charge nurse/RN and consider discussing with provider and RRT  Notify: Charge Nurse/RN  Name of Charge Nurse/RN Notified Tobi Bastos, Consulting civil engineer  Date Charge Nurse/RN Notified 02/17/20  Time Charge Nurse/RN Notified 0315  Document  Patient Outcome Other (Comment) (Increase VS collection)  Progress note created (see row info) Yes

## 2020-02-17 NOTE — Discharge Summary (Addendum)
PATIENT DETAILS Name: Kristin Cook Age: 86 y.o. Sex: female Date of Birth: 04/17/1931 MRN: [id number]. Admitting Physician: Vernelle Emerald, MD QAS:[id number], Elyse Jarvis, MD  Admit Date: 02/12/2020 Discharge date: 02/18/2020  Recommendations for Outpatient Follow-up:  1. Follow up with PCP in 1-2 weeks 2. Please obtain CMP/CBC in one week 3. Repeat Chest Xray in 4-6 week 4. Please ensure follow up with pulmonology  Admitted From:  Home  Disposition: SNF   Home Health: No  Equipment/Devices: None  Discharge Condition: Stable  CODE STATUS: DNR  Diet recommendation:  Diet Order            Diet - low sodium heart healthy           Diet Carb Modified           Diet heart healthy/carb modified Room service appropriate? Yes; Fluid consistency: Thin  Diet effective now                  Brief Narrative: Patient is a 86 y.o. female with PMHx of HTN, HLD, DM-2-presented with back pain-imaging studies showed a T12 compression fracture-evaluated by neurosurgery who recommended TLSO brace/PT/OT-subsequently seen by IR-and underwent kyphoplasty.  She was also found to have COVID-19 infection on initial presentation.  See below for further details.  COVID-19 vaccinated status: Vaccinated  Significant Events: 12/5>> Admit to [facility] for back pain due to T12 compression fracture, COVID-19 infection.  Significant studies: 12/5>> CT abdomen/pelvis: Acute/subacute compression deformity of T12 vertebra, mosaic attenuation of the lung bases/subpleural reticular opacities 12/5>> chest x-ray: Interstitial opacities bilaterally 12/6>> Echo: EF 65-70%, RVSP 65.5 mmHg. 12/6>> bilateral lower extremity Doppler: No DVT  COVID-19 medications: Steroids: 12/5>> Remdesivir: 12/5>>12/9  Antibiotics: None  Microbiology data: None  Procedures: 12/7>>Kyphoplasty  Consults: IR, neurosurgery  Brief Hospital Course: T12 compression fracture: Underwent kyphoplasty by IR  on 12/7-admitting physician also discussed with neurosurgery on-call.  Continue supportive care-evaluated by PT/OT-recommendations of SNF.  Acute Hypoxic Resp Failure due to Covid 19 Viral pneumonia: Appears to have mild disease-was minimally hypoxic-stable on room air-completed Remdesivir 12/9-continue steroids for a few more days.  Marland Kitchen  COVID-19 Labs:  Recent Labs    02/16/20 0039  DDIMER 0.99*  CRP 0.8    Lab Results  Component Value Date   SARSCOV2NAA POSITIVE (A) 02/12/2020     Acute metabolic encephalopathy superimposed on mild dementia: Significantly improved-awake/alert this morning.  Expect some amount of delirium during this hospital stay.  Maintain delirium precautions  Possible underlying interstitial lung disease: See above imaging studies-Dr. Elgergawy-discussed with pulmonology-recommendations are for outpatient follow-up with pulmonology to repeat imaging/HRCT.  Pulmonary hypertension: Likely a sequelae of underlying interstitial lung disease-incidental finding-stable for outpatient follow-up with pulmonology/cardiology.  HTN: BP reasonably controlled-continue amlodipine, HCTZ/lisinopril and follow.  HLD: Continue statin  DM-2 (A1c 9.0 on 01/18/2020) with steroid-induced hyperglycemia: CBGs remain stable-continue SSI-suspect this usually will improve as steroid dose is tapered down.  Plan is to resume oral hypoglycemic agents on discharge.    Discharge Diagnoses:  Principal Problem:   Compression fracture of lumbar spine, non-traumatic, initial encounter [facility]) Active Problems:   Controlled diabetes mellitus type 2 with complications ([place])   Hypertension associated with diabetes ([place])   Hyperlipidemia associated with type 2 diabetes mellitus ([place])   Mild cognitive impairment with memory loss   Hypoxia   COVID-19 virus infection   Discharge Instructions:    Person Under Monitoring Name: Kristin Cook  Location: [address] [place]  [address]  Infection Prevention Recommendations for Individuals Confirmed to have, or Being Evaluated for, 2019 Novel Coronavirus (COVID-19) Infection Who Receive Care at Home  Individuals who are confirmed to have, or are being evaluated for, COVID-19 should follow the prevention steps below until a healthcare provider or local or state health department says they can return to normal activities.  Stay home except to get medical care You should restrict activities outside your home, except for getting medical care. Do not go to work, school, or public areas, and do not use public transportation or taxis.  Call ahead before visiting your doctor Before your medical appointment, call the healthcare provider and tell them that you have, or are being evaluated for, COVID-19 infection. This will help the healthcare provider's office take steps to keep other people from getting infected. Ask your healthcare provider to call the local or state health department.  Monitor your symptoms Seek prompt medical attention if your illness is worsening (e.g., difficulty breathing). Before going to your medical appointment, call the healthcare provider and tell them that you have, or are being evaluated for, COVID-19 infection. Ask your healthcare provider to call the local or state health department.  Wear a facemask You should wear a facemask that covers your nose and mouth when you are in the same room with other people and when you visit a healthcare provider. People who live with or visit you should also wear a facemask while they are in the same room with you.  Separate yourself from other people in your home As much as possible, you should stay in a different room from other people in your home. Also, you should use a separate bathroom, if available.  Avoid sharing household items You should not share dishes, drinking glasses, cups, eating utensils, towels, bedding, or other items with other  people in your home. After using these items, you should wash them thoroughly with soap and water.  Cover your coughs and sneezes Cover your mouth and nose with a tissue when you cough or sneeze, or you can cough or sneeze into your sleeve. Throw used tissues in a lined trash can, and immediately wash your hands with soap and water for at least 20 seconds or use an alcohol-based hand rub.  Wash your [facility] your hands often and thoroughly with soap and water for at least 20 seconds. You can use an alcohol-based hand sanitizer if soap and water are not available and if your hands are not visibly dirty. Avoid touching your eyes, nose, and mouth with unwashed hands.   Prevention Steps for Caregivers and Household Members of Individuals Confirmed to have, or Being Evaluated for, COVID-19 Infection Being Cared for in the Home  If you live with, or provide care at home for, a person confirmed to have, or being evaluated for, COVID-19 infection please follow these guidelines to prevent infection:  Follow healthcare provider's instructions Make sure that you understand and can help the patient follow any healthcare provider instructions for all care.  Provide for the patient's basic needs You should help the patient with basic needs in the home and provide support for getting groceries, prescriptions, and other personal needs.  Monitor the patient's symptoms If they are getting sicker, call his or her medical provider and tell them that the patient has, or is being evaluated for, COVID-19 infection. This will help the healthcare provider's office take steps to keep other people from getting infected. Ask the healthcare provider to call the local or state  health department.  Limit the number of people who have contact with the patient  If possible, have only one caregiver for the patient.  Other household members should stay in another home or place of residence. If this is not  possible, they should stay  in another room, or be separated from the patient as much as possible. Use a separate bathroom, if available.  Restrict visitors who do not have an essential need to be in the home.  Keep older adults, very young children, and other sick people away from the patient Keep older adults, very young children, and those who have compromised immune systems or chronic health conditions away from the patient. This includes people with chronic heart, lung, or kidney conditions, diabetes, and cancer.  Ensure good ventilation Make sure that shared spaces in the home have good air flow, such as from an air conditioner or an opened window, weather permitting.  Wash your hands often  Wash your hands often and thoroughly with soap and water for at least 20 seconds. You can use an alcohol based hand sanitizer if soap and water are not available and if your hands are not visibly dirty.  Avoid touching your eyes, nose, and mouth with unwashed hands.  Use disposable paper towels to dry your hands. If not available, use dedicated cloth towels and replace them when they become wet.  Wear a facemask and gloves  Wear a disposable facemask at all times in the room and gloves when you touch or have contact with the patient's blood, body fluids, and/or secretions or excretions, such as sweat, saliva, sputum, nasal mucus, vomit, urine, or feces.  Ensure the mask fits over your nose and mouth tightly, and do not touch it during use.  Throw out disposable facemasks and gloves after using them. Do not reuse.  Wash your hands immediately after removing your facemask and gloves.  If your personal clothing becomes contaminated, carefully remove clothing and launder. Wash your hands after handling contaminated clothing.  Place all used disposable facemasks, gloves, and other waste in a lined container before disposing them with other household waste.  Remove gloves and wash your hands  immediately after handling these items.  Do not share dishes, glasses, or other household items with the patient  Avoid sharing household items. You should not share dishes, drinking glasses, cups, eating utensils, towels, bedding, or other items with a patient who is confirmed to have, or being evaluated for, COVID-19 infection.  After the person uses these items, you should wash them thoroughly with soap and water.  Wash laundry thoroughly  Immediately remove and wash clothes or bedding that have blood, body fluids, and/or secretions or excretions, such as sweat, saliva, sputum, nasal mucus, vomit, urine, or feces, on them.  Wear gloves when handling laundry from the patient.  Read and follow directions on labels of laundry or clothing items and detergent. In general, wash and dry with the warmest temperatures recommended on the label.  Clean all areas the individual has used often  Clean all touchable surfaces, such as counters, tabletops, doorknobs, bathroom fixtures, toilets, phones, keyboards, tablets, and bedside tables, every day. Also, clean any surfaces that may have blood, body fluids, and/or secretions or excretions on them.  Wear gloves when cleaning surfaces the patient has come in contact with.  Use a diluted bleach solution (e.g., dilute bleach with 1 part bleach and 10 parts water) or a household disinfectant with a label that says EPA-registered for coronaviruses. To make  a bleach solution at home, add 1 tablespoon of bleach to 1 quart (4 cups) of water. For a larger supply, add  cup of bleach to 1 gallon (16 cups) of water.  Read labels of cleaning products and follow recommendations provided on product labels. Labels contain instructions for safe and effective use of the cleaning product including precautions you should take when applying the product, such as wearing gloves or eye protection and making sure you have good ventilation during use of the product.  Remove  gloves and wash hands immediately after cleaning.  Monitor yourself for signs and symptoms of illness Caregivers and household members are considered close contacts, should monitor their health, and will be asked to limit movement outside of the home to the extent possible. Follow the monitoring steps for close contacts listed on the symptom monitoring form.   ? If you have additional questions, contact your local health department or call the epidemiologist on call at [phone] (available 24/7). ? This guidance is subject to change. For the most up-to-date guidance from CDC, please refer to their website: YouBlogs.pl    Activity:  As tolerated   Discharge Instructions    Call MD for:  difficulty breathing, headache or visual disturbances   Complete by: As directed    Diet - low sodium heart healthy   Complete by: As directed    Diet Carb Modified   Complete by: As directed    Discharge instructions   Complete by: As directed    Follow with Primary MD  [name], MD in 1-2 weeks  Please get a complete blood count and chemistry panel checked by your Primary MD at your next visit, and again as instructed by your Primary MD.  Get Medicines reviewed and adjusted: Please take all your medications with you for your next visit with your Primary MD  Laboratory/radiological data: Please request your Primary MD to go over all hospital tests and procedure/radiological results at the follow up, please ask your Primary MD to get all Hospital records sent to his/her office.  In some cases, they will be blood work, cultures and biopsy results pending at the time of your discharge. Please request that your primary care M.D. follows up on these results.  Also Note the following: If you experience worsening of your admission symptoms, develop shortness of breath, life threatening emergency, suicidal or homicidal thoughts you  must seek medical attention immediately by calling 911 or calling your MD immediately  if symptoms less severe.  You must read complete instructions/literature along with all the possible adverse reactions/side effects for all the Medicines you take and that have been prescribed to you. Take any new Medicines after you have completely understood and accpet all the possible adverse reactions/side effects.   Do not drive when taking Pain medications or sleeping medications (Benzodaizepines)  Do not take more than prescribed Pain, Sleep and Anxiety Medications. It is not advisable to combine anxiety,sleep and pain medications without talking with your primary care practitioner  Special Instructions: If you have smoked or chewed Tobacco  in the last 2 yrs please stop smoking, stop any regular Alcohol  and or any Recreational drug use.  Wear Seat belts while driving.  Please note: You were cared for by a hospitalist during your hospital stay. Once you are discharged, your primary care physician will handle any further medical issues. Please note that NO REFILLS for any discharge medications will be authorized once you are discharged, as it is imperative  that you return to your primary care physician (or establish a relationship with a primary care physician if you do not have one) for your post hospital discharge needs so that they can reassess your need for medications and monitor your lab values.   Check CBGs before meals and at bedtime   Increase activity slowly   Complete by: As directed    No wound care   Complete by: As directed      Allergies as of 02/18/2020      Reactions   Penicillins Swelling   Sulfa Antibiotics Swelling   Weak    Sulfamethoxazole Other (See Comments)   Feels like her legs are weighted down      Medication List    STOP taking these medications   naproxen sodium 220 MG tablet Commonly known as: ALEVE     TAKE these medications   acetaminophen 325 MG  tablet Commonly known as: TYLENOL Take 2 tablets (650 mg total) by mouth every 6 (six) hours as needed for mild pain (or Fever >/= 101).   albuterol 108 (90 Base) MCG/ACT inhaler Commonly known as: VENTOLIN HFA Inhale 2 puffs into the lungs every 6 (six) hours as needed for wheezing or shortness of breath.   amLODipine 10 MG tablet Commonly known as: NORVASC TAKE 1 TABLET(10 MG) BY MOUTH DAILY What changed:   how much to take  how to take this  when to take this  additional instructions   calcium-vitamin D 500-200 MG-UNIT tablet Commonly known as: OSCAL WITH D Take 1 tablet by mouth 2 (two) times daily.   dexamethasone 4 MG tablet Commonly known as: DECADRON Take 1 tablet (4 mg total) by mouth daily for 3 days.   insulin aspart 100 UNIT/ML injection Commonly known as: novoLOG 0-15 Units, Subcutaneous, 3 times daily with meals CBG < 70: implement hypoglycemia protocol CBG 70 - 120: 0 units CBG 121 - 150: 2 units CBG 151 - 200: 3 units CBG 201 - 250: 5 units CBG 251 - 300: 8 units CBG 301 - 350: 11 units CBG 351 - 400: 15 units CBG > 400: call MD   lisinopril-hydrochlorothiazide 20-12.5 MG tablet Commonly known as: ZESTORETIC Take 1 tablet by mouth daily.   OneTouch Ultra test strip Generic drug: glucose blood USE AS DIRECTED   oxyCODONE 5 MG immediate release tablet Commonly known as: Oxy IR/ROXICODONE Take 1 tablet (5 mg total) by mouth every 6 (six) hours as needed for up to 8 days for moderate pain.   pioglitazone-metformin 15-850 MG tablet Commonly known as: ACTOPLUS MET TAKE 1 TABLET BY MOUTH TWICE DAILY WITH A MEAL What changed: See the new instructions.   polyethylene glycol 17 g packet Commonly known as: MIRALAX / GLYCOLAX Take 17 g by mouth daily as needed for mild constipation.   rosuvastatin 20 MG tablet Commonly known as: CRESTOR TAKE 1 TABLET(20 MG) BY MOUTH DAILY What changed:   how much to take  how to take this  when to take  this  additional instructions   senna-docusate 8.6-50 MG tablet Commonly known as: Senokot-S Take 2 tablets by mouth 2 (two) times daily.       Contact information for follow-up providers    [name], MD. Schedule an appointment as soon as possible for a visit in 1 week(s).   Specialty: Family Medicine Contact information: [place] [address] [phone]        Brand Males, MD. Schedule an appointment as soon  as possible for a visit in 2 week(s).   Specialty: Pulmonary Disease Contact information: [address] [place] [address] [phone]            Contact information for after-discharge care    Destination    [facility] .   Service: Skilled Nursing Contact information: [place] [address] [phone]                 Allergies  Allergen Reactions  . Penicillins Swelling  . Sulfa Antibiotics Swelling    Weak   . Sulfamethoxazole Other (See Comments)    Feels like her legs are weighted down     Other Procedures/Studies: MR LUMBAR SPINE WO CONTRAST  Result Date: 02/13/2020 CLINICAL DATA:  Acute presentation with back pain over the last few days. EXAM: MRI LUMBAR SPINE WITHOUT CONTRAST TECHNIQUE: Multiplanar, multisequence MR imaging of the lumbar spine was performed. No intravenous contrast was administered. COMPARISON:  CT abdomen yesterday. FINDINGS: Segmentation:  5 lumbar type vertebral bodies. Alignment: Thoracolumbar curvature convex to the right and lumbar curvature convex to the left. Vertebrae: Acute or subacute superior endplate fracture at Z12 with loss of height of 10%. No retropulsed bone. Discogenic degenerative endplate changes on the right at L2-3 and L3-4. Conus medullaris and cauda equina: Conus extends to the L1-2 level. Conus and cauda equina appear normal. Paraspinal and other soft tissues: Negative Disc levels: L1-2: Minimal disc  bulge.  No stenosis or neural compression. L2-3: Disc degeneration more pronounced on the right. Endplate osteophytes and mild bulging of the disc. No compressive canal or foraminal narrowing. L3-4: Disc degeneration more pronounced on the right. Endplate osteophytes and shallow protrusion of the disc. Facet and ligamentous hypertrophy more prominent on the right. Mild stenosis of the right lateral recess and intervertebral foramen on the right that could possibly cause right-sided neural compressive symptoms. L4-5: Mild bulging of the disc.  No stenosis. L5-S1: Minimal bulging of the disc.  No stenosis. IMPRESSION: 1. Acute or subacute superior endplate fracture at W58 with loss of height of 10%. No retropulsed bone. This looks like a benign osteoporotic fracture. 2. Thoracolumbar curvature convex to the right and lumbar curvature convex to the left. 3. Right-sided predominant degenerative changes at L2-3 and L3-4 with right lateral recess and foraminal narrowing that could possibly cause right-sided neural compressive symptoms at the L3-4 level. Electronically Signed   By: Nelson Chimes M.D.   On: 02/13/2020 14:06   CT ABDOMEN PELVIS W CONTRAST  Result Date: 02/12/2020 CLINICAL DATA:  Four days of worsening lower back pain EXAM: CT ABDOMEN AND PELVIS WITH CONTRAST TECHNIQUE: Multidetector CT imaging of the abdomen and pelvis was performed using the standard protocol following bolus administration of intravenous contrast. CONTRAST:  175mL OMNIPAQUE IOHEXOL 300 MG/ML  SOLN COMPARISON:  None. FINDINGS: Lower chest: Coronary artery atherosclerosis. Calcification of the mitral annulus. Cardiac size within normal limits. No pericardial effusion. Mosaic attenuation in the lung bases as well as subpleural reticular opacities may reflect a combination of interstitial disease and small airways disease or air trapping. No pleural effusion. Hepatobiliary: Diffuse hepatic hypoattenuation compatible with hepatic steatosis  with a geographic hyperattenuating region adjacent the gallbladder fossa, possibly reflective of some sparing or transient hepatic attenuation difference. No concerning focal liver lesion. Gallbladder contains several small gallstones. No pericholecystic fluid or inflammation. No biliary ductal dilatation or intraductal gallstones. Pancreas: Moderate pancreatic atrophy. No pancreatic ductal dilatation or surrounding inflammatory changes. Spleen:  Normal in size. No concerning splenic lesions. Adrenals/Urinary Tract: Normal adrenal glands. Kidneys are normally located with symmetric enhancement and excretion. No suspicious renal lesion, urolithiasis or hydronephrosis. Normal bladder. Stomach/Bowel: Small sliding-type hiatal hernia. Distal stomach and duodenum are unremarkable. No small bowel thickening or dilatation. A normal appendix is visualized. No colonic dilatation or wall thickening. Vascular/Lymphatic: Atherosclerotic calcifications within the abdominal aorta and branch vessels. No aneurysm or ectasia. No enlarged abdominopelvic lymph nodes. Reproductive: Slightly retroverted uterus. No concerning adnexal lesions. Other: No abdominopelvic free fluid or free gas. No bowel containing hernias. Musculoskeletal: Acute to subacute appearing anterior wedging compression deformity T12 with a to 40% height loss fracture line may propagate into the posterior vertebral body cortex but without discernible transosseous extension into the posterior tension band. No other acute fractures are seen. Mixed sclerotic and lucent changes are present L2-L4 centered upon focal discogenic changes at a region of maximal levocurvature in the spine. These are favored to reflect Modic type endplate changes. Sclerotic features isolated to the iliac side of the bilateral SI joints may reflect sequela of prior osteitis condensans. Findings on a background of moderate bilateral SI joint and bilateral hip osteoarthrosis including  chondrocalcinosis which can reflect CPPD. IMPRESSION: 1. Acute to subacute appearing anterior wedging compression deformity T12 with a to 40% height loss anteriorly. Fracture line may propagate into the posterior vertebral body cortex but without discernible transosseous extension into the posterior tension band. 2. Cholelithiasis without evidence of acute cholecystitis. 3. Small sliding-type hiatal hernia. 4. Mosaic attenuation in the lung bases as well as subpleural reticular opacities may reflect a combination of interstitial disease and small airways disease or air trapping. Consider further outpatient characterization with HRCT, as clinically warranted. 5. Aortic Atherosclerosis (ICD10-I70.0). Coronary artery atherosclerosis. Electronically Signed   By: Lovena Le M.D.   On: 02/12/2020 16:01   DG Chest Port 1 View  Result Date: 02/12/2020 CLINICAL DATA:  Shortness of breath. EXAM: PORTABLE CHEST 1 VIEW COMPARISON:  March 15, 2013 FINDINGS: Cardiomegaly. The hila and mediastinum are unremarkable. No pneumothorax. Increased interstitial opacities, new in the interval. No other acute abnormalities. IMPRESSION: Findings are most suggestive of cardiomegaly and mild edema. Electronically Signed   By: Dorise Bullion III M.D   On: 02/12/2020 12:39   IR KYPHO LUMBAR INC FX REDUCE BONE BX UNI/BIL CANNULATION INC/IMAGING  Result Date: 02/15/2020 INDICATION: Severe low back pain secondary to compression fracture at T12. EXAM: BALLOON KYPHOPLASTY T12 COMPARISON:  MRI lumbosacral spine of February 13, 2020. MEDICATIONS: As antibiotic prophylaxis, Ancef 2 g IV was ordered pre-procedure and administered intravenously within 1 hour of incision. ANESTHESIA/SEDATION: Moderate (conscious) sedation was employed during this procedure. A total of Versed 1.5 mg and Fentanyl 50 mcg was administered intravenously. Moderate Sedation Time: 28 minutes. The patient's level of consciousness and vital signs were monitored  continuously by radiology nursing throughout the procedure under my direct supervision. FLUOROSCOPY TIME:  Fluoroscopy Time: 10 minutes 36 seconds (950 mGy) COMPLICATIONS: None immediate. PROCEDURE: Following a full explanation of the procedure along with the potential associated complications, an informed witnessed consent was obtained. The patient was placed prone on the fluoroscopic table. The skin overlying the thoracolumbar region was then prepped and draped in the usual sterile fashion. The right pedicle at T12 was then infiltrated with 0.25% bupivacaine followed by the advancement of an 11-gauge Jamshidi needle through the right pedicle into the posterior one-third at T12. This was then exchanged for a Kyphon advanced osteo introducer system comprised of  a working cannula and a Engineer, manufacturing systems. This combination was then advanced over a Kyphon osteo bone pin until the tip of the Kyphon osteo drill was in the posterior third at T12. At this time, the bone pin was removed. In a medial trajectory, the combination was advanced until the tip of the working cannula was inside the posterior one-third at T12. The osteo drill was removed. Through the working cannula, a Kyphon inflatable bone tamp 20 x 3 was advanced and positioned with the distal marker 5 mm from the anterior aspect of T12. Crossing of the midline was seen on the AP projection. At this time, the balloon was expanded using contrast via a Kyphon inflation syringe device via microtubing. Inflations were continued until there was apposition with the superior endplate. At this time, methylmethacrylate mixture was reconstituted with Tobramycin in the Kyphon bone mixing device system. This was then loaded onto the Kyphon bone fillers. The balloon was deflated and removed followed by the instillation of 4 bone filler equivalents of methylmethacrylate mixture at T12 with excellent filling in the AP and lateral projections. No extravasation was noted in the  disk spaces or posteriorly into the spinal canal. A minute amount of methylmethacrylate mixture was seen in adjacent paraspinous vein, where it remained stable throughout the procedure. The working cannula and the bone filler were then retrieved and removed. Hemostasis was achieved at the skin entry site. Patient was then returned to the floor in a stable condition. IMPRESSION: 1. Status post vertebral body augmentation using balloon kyphoplasty at T12 as described without event. Electronically Signed   By: Luanne Bras M.D.   On: 02/14/2020 17:13   VAS [place] LOWER EXTREMITY VENOUS (DVT)  Result Date: 02/13/2020  Lower Venous DVT Study Other Indications: Covid-19, Elevated D-Dimer. Comparison Study: No previous Venous duplex. Performing Technologist: Vonzell Schlatter  Examination Guidelines: A complete evaluation includes B-mode imaging, spectral Doppler, color Doppler, and power Doppler as needed of all accessible portions of each vessel. Bilateral testing is considered an integral part of a complete examination. Limited examinations for reoccurring indications may be performed as noted. The reflux portion of the exam is performed with the patient in reverse Trendelenburg.  +---------+---------------+---------+-----------+----------+--------------+ RIGHT    CompressibilityPhasicitySpontaneityPropertiesThrombus Aging +---------+---------------+---------+-----------+----------+--------------+ CFV      Full           Yes      Yes                                 +---------+---------------+---------+-----------+----------+--------------+ SFJ      Full                                                        +---------+---------------+---------+-----------+----------+--------------+ FV Prox  Full                                                        +---------+---------------+---------+-----------+----------+--------------+ FV Mid   Full                                                         +---------+---------------+---------+-----------+----------+--------------+  FV DistalFull                                                        +---------+---------------+---------+-----------+----------+--------------+ PFV      Full                                                        +---------+---------------+---------+-----------+----------+--------------+ POP      Full           Yes      Yes                                 +---------+---------------+---------+-----------+----------+--------------+ PTV      Full                                                        +---------+---------------+---------+-----------+----------+--------------+ PERO     Full                                                        +---------+---------------+---------+-----------+----------+--------------+   +---------+---------------+---------+-----------+----------+--------------+ LEFT     CompressibilityPhasicitySpontaneityPropertiesThrombus Aging +---------+---------------+---------+-----------+----------+--------------+ CFV      Full           Yes      Yes                                 +---------+---------------+---------+-----------+----------+--------------+ SFJ      Full                                                        +---------+---------------+---------+-----------+----------+--------------+ FV Prox  Full                                                        +---------+---------------+---------+-----------+----------+--------------+ FV Mid   Full                                                        +---------+---------------+---------+-----------+----------+--------------+ FV DistalFull                                                        +---------+---------------+---------+-----------+----------+--------------+   PFV      Full                                                         +---------+---------------+---------+-----------+----------+--------------+ POP      Full           Yes      Yes                                 +---------+---------------+---------+-----------+----------+--------------+ PTV      Full                                                        +---------+---------------+---------+-----------+----------+--------------+ PERO     Full                                                        +---------+---------------+---------+-----------+----------+--------------+     Summary: RIGHT: - There is no evidence of deep vein thrombosis in the lower extremity.  - No cystic structure found in the popliteal fossa.  LEFT: - There is no evidence of deep vein thrombosis in the lower extremity.  - No cystic structure found in the popliteal fossa.  *See table(s) above for measurements and observations. Electronically signed by Ruta Hinds MD on 02/13/2020 at 5:00:16 PM.    Final    ECHOCARDIOGRAM LIMITED  Result Date: 02/13/2020    ECHOCARDIOGRAM LIMITED REPORT   Patient Name:   LONNA RABOLD Date of Exam: 02/13/2020 Medical Rec #:  [id number]        Height:       61.0 in Accession #:    [id number]       Weight:       138.6 lb Date of Birth:  05/21/1931        BSA:          1.616 m Patient Age:    83 years         BP:           157/74 mmHg Patient Gender: F                HR:           85 bpm. Exam Location:  Inpatient Procedure: Limited Echo, Cardiac Doppler and Color Doppler Indications:    CHF  History:        Patient has no prior history of Echocardiogram examinations.                 Signs/Symptoms:Dementia; Risk Factors:Hypertension,                 Dyslipidemia, Diabetes and LE edema. T12 compression fracture,                 COVID+.  Sonographer:    Dustin Flock Referring Phys: [place]  Sonographer Comments: Back fracture. IMPRESSIONS  1. Left ventricular ejection fraction, by estimation, is 65 to 70%.  The left ventricle has normal  function. The left ventricle has no regional wall motion abnormalities. There is moderate concentric left ventricular hypertrophy. Left ventricular diastolic function could not be evaluated. There is the interventricular septum is flattened in systole, consistent with right ventricular pressure overload.  2. Right ventricular systolic function is normal. The right ventricular size is moderately enlarged. There is severely elevated pulmonary artery systolic pressure. The estimated right ventricular systolic pressure is 38.2 mmHg.  3. The mitral valve is degenerative. Trivial mitral valve regurgitation. Mild mitral stenosis. The mean mitral valve gradient is 5.9 mmHg with average heart rate of 85 bpm. Moderate to severe mitral annular calcification.  4. The tricuspid valve is abnormal. Tricuspid valve regurgitation is moderate.  5. The aortic valve is tricuspid. There is mild calcification of the aortic valve. There is mild thickening of the aortic valve. Aortic valve regurgitation is not visualized. Mild aortic valve sclerosis is present, with no evidence of aortic valve stenosis.  6. The inferior vena cava is normal in size with <50% respiratory variability, suggesting right atrial pressure of 8 mmHg. Comparison(s): No prior Echocardiogram. Conclusion(s)/Recommendation(s): Normal LV function. Moderately dilated RV with preserved function. RVSP severely elevated ~65 mmHG. Septal flattening is present in systole consistent with RV pressure overload. FINDINGS  Left Ventricle: Left ventricular ejection fraction, by estimation, is 65 to 70%. The left ventricle has normal function. The left ventricle has no regional wall motion abnormalities. The left ventricular internal cavity size was normal in size. There is  moderate concentric left ventricular hypertrophy. The interventricular septum is flattened in systole, consistent with right ventricular pressure overload. Left ventricular diastolic function could not be  evaluated. Left ventricular diastolic function could not be evaluated due to mitral annular calcification (moderate or greater). Right Ventricle: The right ventricular size is moderately enlarged. No increase in right ventricular wall thickness. Right ventricular systolic function is normal. There is severely elevated pulmonary artery systolic pressure. The tricuspid regurgitant velocity is 3.79 m/s, and with an assumed right atrial pressure of 8 mmHg, the estimated right ventricular systolic pressure is 50.5 mmHg. Pericardium: Trivial pericardial effusion is present. Mitral Valve: The mitral valve is degenerative in appearance. There is mild calcification of the anterior and posterior mitral valve leaflet(s). Moderate to severe mitral annular calcification. Trivial mitral valve regurgitation. Mild mitral valve stenosis. MV peak gradient, 15.4 mmHg. The mean mitral valve gradient is 5.9 mmHg with average heart rate of 85 bpm. Tricuspid Valve: The tricuspid valve is abnormal. Tricuspid valve regurgitation is moderate . No evidence of tricuspid stenosis. Aortic Valve: The aortic valve is tricuspid. There is mild calcification of the aortic valve. There is mild thickening of the aortic valve. Aortic valve regurgitation is not visualized. Mild aortic valve sclerosis is present, with no evidence of aortic valve stenosis. Aorta: The aortic root is normal in size and structure. Venous: The inferior vena cava is normal in size with less than 50% respiratory variability, suggesting right atrial pressure of 8 mmHg. LEFT VENTRICLE PLAX 2D LVIDd:         3.20 cm  Diastology LVIDs:         2.40 cm  LV e' medial:    4.35 cm/s LV PW:         1.40 cm  LV E/e' medial:  21.2 LV IVS:        1.50 cm  LV e' lateral:   3.59 cm/s LVOT diam:     2.00 cm  LV E/e' lateral: 25.7 LV  SV:         61 LV SV Index:   38 LVOT Area:     3.14 cm  RIGHT VENTRICLE RV S prime:     7.83 cm/s LEFT ATRIUM         Index LA diam:    3.10 cm 1.92 cm/m   AORTIC VALVE LVOT Vmax:   106.00 cm/s LVOT Vmean:  73.100 cm/s LVOT VTI:    0.194 m  AORTA Ao Root diam: 3.00 cm MITRAL VALVE                TRICUSPID VALVE MV Area (PHT): 3.42 cm     TR Peak grad:   57.5 mmHg MV Area VTI:   1.90 cm     TR Vmax:        379.00 cm/s MV Peak grad:  15.4 mmHg MV Mean grad:  5.9 mmHg     SHUNTS MV Vmax:       1.96 m/s     Systemic VTI:  0.19 m MV Vmean:      109.0 cm/s   Systemic Diam: 2.00 cm MV VTI:        0.32 m MV Decel Time: 222 msec MV E velocity: 92.40 cm/s MV A velocity: 173.00 cm/s MV E/A ratio:  0.53 Eleonore Chiquito MD Electronically signed by Eleonore Chiquito MD Signature Date/Time: 02/13/2020/3:54:22 PM    Final      TODAY-DAY OF DISCHARGE:  Subjective:   [name] today has no headache,no chest abdominal pain,no new weakness tingling or numbness, feels much better wants to go home today.   Objective:   Blood pressure (!) 135/55, pulse 70, temperature 97.6 F (36.4 C), temperature source Oral, resp. rate (!) 21, height $RemoveBe'5\' 1"'YChZitXTK$  (1.549 m), weight 61.5 kg, SpO2 94 %.  Intake/Output Summary (Last 24 hours) at 02/18/2020 1017 Last data filed at 02/18/2020 0900 Gross per 24 hour  Intake 240 ml  Output 650 ml  Net -410 ml   Filed Weights   02/14/20 1742 02/17/20 0626 02/18/20 0400  Weight: 61.6 kg 62.2 kg 61.5 kg    Exam: Awake Alert, Oriented *3, No new F.N deficits, Normal affect [place].AT,PERRAL Supple Neck,No JVD, No cervical lymphadenopathy appriciated.  Symmetrical Chest wall movement, Good air movement bilaterally, CTAB RRR,No Gallops,Rubs or new Murmurs, No Parasternal Heave +ve B.Sounds, Abd Soft, Non tender, No organomegaly appriciated, No rebound -guarding or rigidity. No Cyanosis, Clubbing or edema, No new Rash or bruise   PERTINENT RADIOLOGIC STUDIES: MR LUMBAR SPINE WO CONTRAST  Result Date: 02/13/2020 CLINICAL DATA:  Acute presentation with back pain over the last few days. EXAM: MRI LUMBAR SPINE WITHOUT CONTRAST TECHNIQUE:  Multiplanar, multisequence MR imaging of the lumbar spine was performed. No intravenous contrast was administered. COMPARISON:  CT abdomen yesterday. FINDINGS: Segmentation:  5 lumbar type vertebral bodies. Alignment: Thoracolumbar curvature convex to the right and lumbar curvature convex to the left. Vertebrae: Acute or subacute superior endplate fracture at Q67 with loss of height of 10%. No retropulsed bone. Discogenic degenerative endplate changes on the right at L2-3 and L3-4. Conus medullaris and cauda equina: Conus extends to the L1-2 level. Conus and cauda equina appear normal. Paraspinal and other soft tissues: Negative Disc levels: L1-2: Minimal disc bulge.  No stenosis or neural compression. L2-3: Disc degeneration more pronounced on the right. Endplate osteophytes and mild bulging of the disc. No compressive canal or foraminal narrowing. L3-4: Disc degeneration more pronounced on the right. Endplate osteophytes and shallow protrusion  of the disc. Facet and ligamentous hypertrophy more prominent on the right. Mild stenosis of the right lateral recess and intervertebral foramen on the right that could possibly cause right-sided neural compressive symptoms. L4-5: Mild bulging of the disc.  No stenosis. L5-S1: Minimal bulging of the disc.  No stenosis. IMPRESSION: 1. Acute or subacute superior endplate fracture at E10 with loss of height of 10%. No retropulsed bone. This looks like a benign osteoporotic fracture. 2. Thoracolumbar curvature convex to the right and lumbar curvature convex to the left. 3. Right-sided predominant degenerative changes at L2-3 and L3-4 with right lateral recess and foraminal narrowing that could possibly cause right-sided neural compressive symptoms at the L3-4 level. Electronically Signed   By: Nelson Chimes M.D.   On: 02/13/2020 14:06   CT ABDOMEN PELVIS W CONTRAST  Result Date: 02/12/2020 CLINICAL DATA:  Four days of worsening lower back pain EXAM: CT ABDOMEN AND PELVIS WITH  CONTRAST TECHNIQUE: Multidetector CT imaging of the abdomen and pelvis was performed using the standard protocol following bolus administration of intravenous contrast. CONTRAST:  168mL OMNIPAQUE IOHEXOL 300 MG/ML  SOLN COMPARISON:  None. FINDINGS: Lower chest: Coronary artery atherosclerosis. Calcification of the mitral annulus. Cardiac size within normal limits. No pericardial effusion. Mosaic attenuation in the lung bases as well as subpleural reticular opacities may reflect a combination of interstitial disease and small airways disease or air trapping. No pleural effusion. Hepatobiliary: Diffuse hepatic hypoattenuation compatible with hepatic steatosis with a geographic hyperattenuating region adjacent the gallbladder fossa, possibly reflective of some sparing or transient hepatic attenuation difference. No concerning focal liver lesion. Gallbladder contains several small gallstones. No pericholecystic fluid or inflammation. No biliary ductal dilatation or intraductal gallstones. Pancreas: Moderate pancreatic atrophy. No pancreatic ductal dilatation or surrounding inflammatory changes. Spleen: Normal in size. No concerning splenic lesions. Adrenals/Urinary Tract: Normal adrenal glands. Kidneys are normally located with symmetric enhancement and excretion. No suspicious renal lesion, urolithiasis or hydronephrosis. Normal bladder. Stomach/Bowel: Small sliding-type hiatal hernia. Distal stomach and duodenum are unremarkable. No small bowel thickening or dilatation. A normal appendix is visualized. No colonic dilatation or wall thickening. Vascular/Lymphatic: Atherosclerotic calcifications within the abdominal aorta and branch vessels. No aneurysm or ectasia. No enlarged abdominopelvic lymph nodes. Reproductive: Slightly retroverted uterus. No concerning adnexal lesions. Other: No abdominopelvic free fluid or free gas. No bowel containing hernias. Musculoskeletal: Acute to subacute appearing anterior wedging  compression deformity T12 with a to 40% height loss fracture line may propagate into the posterior vertebral body cortex but without discernible transosseous extension into the posterior tension band. No other acute fractures are seen. Mixed sclerotic and lucent changes are present L2-L4 centered upon focal discogenic changes at a region of maximal levocurvature in the spine. These are favored to reflect Modic type endplate changes. Sclerotic features isolated to the iliac side of the bilateral SI joints may reflect sequela of prior osteitis condensans. Findings on a background of moderate bilateral SI joint and bilateral hip osteoarthrosis including chondrocalcinosis which can reflect CPPD. IMPRESSION: 1. Acute to subacute appearing anterior wedging compression deformity T12 with a to 40% height loss anteriorly. Fracture line may propagate into the posterior vertebral body cortex but without discernible transosseous extension into the posterior tension band. 2. Cholelithiasis without evidence of acute cholecystitis. 3. Small sliding-type hiatal hernia. 4. Mosaic attenuation in the lung bases as well as subpleural reticular opacities may reflect a combination of interstitial disease and small airways disease or air trapping. Consider further outpatient characterization with HRCT, as clinically warranted.  5. Aortic Atherosclerosis (ICD10-I70.0). Coronary artery atherosclerosis. Electronically Signed   By: Lovena Le M.D.   On: 02/12/2020 16:01   DG Chest Port 1 View  Result Date: 02/12/2020 CLINICAL DATA:  Shortness of breath. EXAM: PORTABLE CHEST 1 VIEW COMPARISON:  March 15, 2013 FINDINGS: Cardiomegaly. The hila and mediastinum are unremarkable. No pneumothorax. Increased interstitial opacities, new in the interval. No other acute abnormalities. IMPRESSION: Findings are most suggestive of cardiomegaly and mild edema. Electronically Signed   By: Dorise Bullion III M.D   On: 02/12/2020 12:39   IR KYPHO  LUMBAR INC FX REDUCE BONE BX UNI/BIL CANNULATION INC/IMAGING  Result Date: 02/15/2020 INDICATION: Severe low back pain secondary to compression fracture at T12. EXAM: BALLOON KYPHOPLASTY T12 COMPARISON:  MRI lumbosacral spine of February 13, 2020. MEDICATIONS: As antibiotic prophylaxis, Ancef 2 g IV was ordered pre-procedure and administered intravenously within 1 hour of incision. ANESTHESIA/SEDATION: Moderate (conscious) sedation was employed during this procedure. A total of Versed 1.5 mg and Fentanyl 50 mcg was administered intravenously. Moderate Sedation Time: 28 minutes. The patient's level of consciousness and vital signs were monitored continuously by radiology nursing throughout the procedure under my direct supervision. FLUOROSCOPY TIME:  Fluoroscopy Time: 10 minutes 36 seconds (458 mGy) COMPLICATIONS: None immediate. PROCEDURE: Following a full explanation of the procedure along with the potential associated complications, an informed witnessed consent was obtained. The patient was placed prone on the fluoroscopic table. The skin overlying the thoracolumbar region was then prepped and draped in the usual sterile fashion. The right pedicle at T12 was then infiltrated with 0.25% bupivacaine followed by the advancement of an 11-gauge Jamshidi needle through the right pedicle into the posterior one-third at T12. This was then exchanged for a Kyphon advanced osteo introducer system comprised of a working cannula and a Kyphon osteo drill. This combination was then advanced over a Kyphon osteo bone pin until the tip of the Kyphon osteo drill was in the posterior third at T12. At this time, the bone pin was removed. In a medial trajectory, the combination was advanced until the tip of the working cannula was inside the posterior one-third at T12. The osteo drill was removed. Through the working cannula, a Kyphon inflatable bone tamp 20 x 3 was advanced and positioned with the distal marker 5 mm from the  anterior aspect of T12. Crossing of the midline was seen on the AP projection. At this time, the balloon was expanded using contrast via a Kyphon inflation syringe device via microtubing. Inflations were continued until there was apposition with the superior endplate. At this time, methylmethacrylate mixture was reconstituted with Tobramycin in the Kyphon bone mixing device system. This was then loaded onto the Kyphon bone fillers. The balloon was deflated and removed followed by the instillation of 4 bone filler equivalents of methylmethacrylate mixture at T12 with excellent filling in the AP and lateral projections. No extravasation was noted in the disk spaces or posteriorly into the spinal canal. A minute amount of methylmethacrylate mixture was seen in adjacent paraspinous vein, where it remained stable throughout the procedure. The working cannula and the bone filler were then retrieved and removed. Hemostasis was achieved at the skin entry site. Patient was then returned to the floor in a stable condition. IMPRESSION: 1. Status post vertebral body augmentation using balloon kyphoplasty at T12 as described without event. Electronically Signed   By: Luanne Bras M.D.   On: 02/14/2020 17:13   VAS [place] LOWER EXTREMITY VENOUS (DVT)  Result Date:  02/13/2020  Lower Venous DVT Study Other Indications: Covid-19, Elevated D-Dimer. Comparison Study: No previous Venous duplex. Performing Technologist: Vonzell Schlatter  Examination Guidelines: A complete evaluation includes B-mode imaging, spectral Doppler, color Doppler, and power Doppler as needed of all accessible portions of each vessel. Bilateral testing is considered an integral part of a complete examination. Limited examinations for reoccurring indications may be performed as noted. The reflux portion of the exam is performed with the patient in reverse Trendelenburg.  +---------+---------------+---------+-----------+----------+--------------+ RIGHT     CompressibilityPhasicitySpontaneityPropertiesThrombus Aging +---------+---------------+---------+-----------+----------+--------------+ CFV      Full           Yes      Yes                                 +---------+---------------+---------+-----------+----------+--------------+ SFJ      Full                                                        +---------+---------------+---------+-----------+----------+--------------+ FV Prox  Full                                                        +---------+---------------+---------+-----------+----------+--------------+ FV Mid   Full                                                        +---------+---------------+---------+-----------+----------+--------------+ FV DistalFull                                                        +---------+---------------+---------+-----------+----------+--------------+ PFV      Full                                                        +---------+---------------+---------+-----------+----------+--------------+ POP      Full           Yes      Yes                                 +---------+---------------+---------+-----------+----------+--------------+ PTV      Full                                                        +---------+---------------+---------+-----------+----------+--------------+ PERO     Full                                                        +---------+---------------+---------+-----------+----------+--------------+   +---------+---------------+---------+-----------+----------+--------------+  LEFT     CompressibilityPhasicitySpontaneityPropertiesThrombus Aging +---------+---------------+---------+-----------+----------+--------------+ CFV      Full           Yes      Yes                                 +---------+---------------+---------+-----------+----------+--------------+ SFJ      Full                                                         +---------+---------------+---------+-----------+----------+--------------+ FV Prox  Full                                                        +---------+---------------+---------+-----------+----------+--------------+ FV Mid   Full                                                        +---------+---------------+---------+-----------+----------+--------------+ FV DistalFull                                                        +---------+---------------+---------+-----------+----------+--------------+ PFV      Full                                                        +---------+---------------+---------+-----------+----------+--------------+ POP      Full           Yes      Yes                                 +---------+---------------+---------+-----------+----------+--------------+ PTV      Full                                                        +---------+---------------+---------+-----------+----------+--------------+ PERO     Full                                                        +---------+---------------+---------+-----------+----------+--------------+     Summary: RIGHT: - There is no evidence of deep vein thrombosis in the lower extremity.  - No cystic structure found in the popliteal fossa.  LEFT: - There is no evidence of deep vein thrombosis in the lower extremity.  - No  cystic structure found in the popliteal fossa.  *See table(s) above for measurements and observations. Electronically signed by Ruta Hinds MD on 02/13/2020 at 5:00:16 PM.    Final    ECHOCARDIOGRAM LIMITED  Result Date: 02/13/2020    ECHOCARDIOGRAM LIMITED REPORT   Patient Name:   LACONDA BASICH Date of Exam: 02/13/2020 Medical Rec #:  [id number]        Height:       61.0 in Accession #:    [id number]       Weight:       138.6 lb Date of Birth:  Jul 20, 1931        BSA:          1.616 m Patient Age:    88 years         BP:           157/74 mmHg Patient  Gender: F                HR:           85 bpm. Exam Location:  Inpatient Procedure: Limited Echo, Cardiac Doppler and Color Doppler Indications:    CHF  History:        Patient has no prior history of Echocardiogram examinations.                 Signs/Symptoms:Dementia; Risk Factors:Hypertension,                 Dyslipidemia, Diabetes and LE edema. T12 compression fracture,                 COVID+.  Sonographer:    Dustin Flock Referring Phys: [place]  Sonographer Comments: Back fracture. IMPRESSIONS  1. Left ventricular ejection fraction, by estimation, is 65 to 70%. The left ventricle has normal function. The left ventricle has no regional wall motion abnormalities. There is moderate concentric left ventricular hypertrophy. Left ventricular diastolic function could not be evaluated. There is the interventricular septum is flattened in systole, consistent with right ventricular pressure overload.  2. Right ventricular systolic function is normal. The right ventricular size is moderately enlarged. There is severely elevated pulmonary artery systolic pressure. The estimated right ventricular systolic pressure is 16.3 mmHg.  3. The mitral valve is degenerative. Trivial mitral valve regurgitation. Mild mitral stenosis. The mean mitral valve gradient is 5.9 mmHg with average heart rate of 85 bpm. Moderate to severe mitral annular calcification.  4. The tricuspid valve is abnormal. Tricuspid valve regurgitation is moderate.  5. The aortic valve is tricuspid. There is mild calcification of the aortic valve. There is mild thickening of the aortic valve. Aortic valve regurgitation is not visualized. Mild aortic valve sclerosis is present, with no evidence of aortic valve stenosis.  6. The inferior vena cava is normal in size with <50% respiratory variability, suggesting right atrial pressure of 8 mmHg. Comparison(s): No prior Echocardiogram. Conclusion(s)/Recommendation(s): Normal LV function. Moderately  dilated RV with preserved function. RVSP severely elevated ~65 mmHG. Septal flattening is present in systole consistent with RV pressure overload. FINDINGS  Left Ventricle: Left ventricular ejection fraction, by estimation, is 65 to 70%. The left ventricle has normal function. The left ventricle has no regional wall motion abnormalities. The left ventricular internal cavity size was normal in size. There is  moderate concentric left ventricular hypertrophy. The interventricular septum is flattened in systole, consistent with right ventricular pressure overload. Left ventricular diastolic function could not be evaluated. Left ventricular diastolic function could not  be evaluated due to mitral annular calcification (moderate or greater). Right Ventricle: The right ventricular size is moderately enlarged. No increase in right ventricular wall thickness. Right ventricular systolic function is normal. There is severely elevated pulmonary artery systolic pressure. The tricuspid regurgitant velocity is 3.79 m/s, and with an assumed right atrial pressure of 8 mmHg, the estimated right ventricular systolic pressure is 03.4 mmHg. Pericardium: Trivial pericardial effusion is present. Mitral Valve: The mitral valve is degenerative in appearance. There is mild calcification of the anterior and posterior mitral valve leaflet(s). Moderate to severe mitral annular calcification. Trivial mitral valve regurgitation. Mild mitral valve stenosis. MV peak gradient, 15.4 mmHg. The mean mitral valve gradient is 5.9 mmHg with average heart rate of 85 bpm. Tricuspid Valve: The tricuspid valve is abnormal. Tricuspid valve regurgitation is moderate . No evidence of tricuspid stenosis. Aortic Valve: The aortic valve is tricuspid. There is mild calcification of the aortic valve. There is mild thickening of the aortic valve. Aortic valve regurgitation is not visualized. Mild aortic valve sclerosis is present, with no evidence of aortic valve  stenosis. Aorta: The aortic root is normal in size and structure. Venous: The inferior vena cava is normal in size with less than 50% respiratory variability, suggesting right atrial pressure of 8 mmHg. LEFT VENTRICLE PLAX 2D LVIDd:         3.20 cm  Diastology LVIDs:         2.40 cm  LV e' medial:    4.35 cm/s LV PW:         1.40 cm  LV E/e' medial:  21.2 LV IVS:        1.50 cm  LV e' lateral:   3.59 cm/s LVOT diam:     2.00 cm  LV E/e' lateral: 25.7 LV SV:         61 LV SV Index:   38 LVOT Area:     3.14 cm  RIGHT VENTRICLE RV S prime:     7.83 cm/s LEFT ATRIUM         Index LA diam:    3.10 cm 1.92 cm/m  AORTIC VALVE LVOT Vmax:   106.00 cm/s LVOT Vmean:  73.100 cm/s LVOT VTI:    0.194 m  AORTA Ao Root diam: 3.00 cm MITRAL VALVE                TRICUSPID VALVE MV Area (PHT): 3.42 cm     TR Peak grad:   57.5 mmHg MV Area VTI:   1.90 cm     TR Vmax:        379.00 cm/s MV Peak grad:  15.4 mmHg MV Mean grad:  5.9 mmHg     SHUNTS MV Vmax:       1.96 m/s     Systemic VTI:  0.19 m MV Vmean:      109.0 cm/s   Systemic Diam: 2.00 cm MV VTI:        0.32 m MV Decel Time: 222 msec MV E velocity: 92.40 cm/s MV A velocity: 173.00 cm/s MV E/A ratio:  0.53 Eleonore Chiquito MD Electronically signed by Eleonore Chiquito MD Signature Date/Time: 02/13/2020/3:54:22 PM    Final      PERTINENT LAB RESULTS: CBC: Recent Labs    02/16/20 0039  WBC 9.0  HGB 14.3  HCT 41.2  PLT 302   CMET CMP     Component Value Date/Time   NA 136 02/16/2020 0039   NA 137 01/18/2020 1005  K 4.2 02/16/2020 0039   CL 99 02/16/2020 0039   CO2 27 02/16/2020 0039   GLUCOSE 136 (H) 02/16/2020 0039   BUN 41 (H) 02/16/2020 0039   BUN 11 01/18/2020 1005   CREATININE 0.81 02/16/2020 0039   CREATININE 0.76 10/14/2016 0822   CALCIUM 9.5 02/16/2020 0039   PROT 5.6 (L) 02/16/2020 0039   PROT 7.2 01/18/2020 1005   ALBUMIN 2.9 (L) 02/16/2020 0039   ALBUMIN 4.4 01/18/2020 1005   AST 17 02/16/2020 0039   ALT 15 02/16/2020 0039   ALKPHOS 76  02/16/2020 0039   BILITOT 0.4 02/16/2020 0039   BILITOT 0.4 01/18/2020 1005   GFRNONAA >60 02/16/2020 0039   GFRAA 86 01/18/2020 1005    GFR Estimated Creatinine Clearance: 41.9 mL/min (by C-G formula based on SCr of 0.81 mg/dL). No results for input(s): LIPASE, AMYLASE in the last 72 hours. No results for input(s): CKTOTAL, CKMB, CKMBINDEX, TROPONINI in the last 72 hours. Invalid input(s): POCBNP Recent Labs    02/16/20 0039  DDIMER 0.99*   No results for input(s): HGBA1C in the last 72 hours. No results for input(s): CHOL, HDL, LDLCALC, TRIG, CHOLHDL, LDLDIRECT in the last 72 hours. No results for input(s): TSH, T4TOTAL, T3FREE, THYROIDAB in the last 72 hours.  Invalid input(s): FREET3 No results for input(s): VITAMINB12, FOLATE, FERRITIN, TIBC, IRON, RETICCTPCT in the last 72 hours. Coags: No results for input(s): INR in the last 72 hours.  Invalid input(s): PT Microbiology: Recent Results (from the past 240 hour(s))  Resp Panel by RT-PCR (Flu A&B, Covid) Nasopharyngeal Swab     Status: Abnormal   Collection Time: 02/12/20  8:30 PM   Specimen: Nasopharyngeal Swab; Nasopharyngeal(NP) swabs in vial transport medium  Result Value Ref Range Status   SARS Coronavirus 2 by RT PCR POSITIVE (A) NEGATIVE Final    Comment: RESULT CALLED TO, READ BACK BY AND VERIFIED WITH: Spencer 2203 02/12/2020 T. TYSOR (NOTE) SARS-CoV-2 target nucleic acids are DETECTED.  The SARS-CoV-2 RNA is generally detectable in upper respiratory specimens during the acute phase of infection. Positive results are indicative of the presence of the identified virus, but do not rule out bacterial infection or co-infection with other pathogens not detected by the test. Clinical correlation with patient history and other diagnostic information is necessary to determine patient infection status. The expected result is Negative.  Fact Sheet for Patients: EntrepreneurPulse.com.au  Fact  Sheet for Healthcare Providers: IncredibleEmployment.be  This test is not yet approved or cleared by the [place] FDA and  has been authorized for detection and/or diagnosis of SARS-CoV-2 by FDA under an Emergency Use Authorization (EUA).  This EUA will remain in effect (meaning this test c an be used) for the duration of  the COVID-19 declaration under Section 564(b)(1) of the Act, 21 U.S.C. section 360bbb-3(b)(1), unless the authorization is terminated or revoked sooner.     Influenza A by PCR NEGATIVE NEGATIVE Final   Influenza B by PCR NEGATIVE NEGATIVE Final    Comment: (NOTE) The Xpert Xpress SARS-CoV-2/FLU/RSV plus assay is intended as an aid in the diagnosis of influenza from Nasopharyngeal swab specimens and should not be used as a sole basis for treatment. Nasal washings and aspirates are unacceptable for Xpert Xpress SARS-CoV-2/FLU/RSV testing.  Fact Sheet for Patients: EntrepreneurPulse.com.au  Fact Sheet for Healthcare Providers: IncredibleEmployment.be  This test is not yet approved or cleared by the [place] FDA and has been authorized for detection and/or diagnosis of SARS-CoV-2 by FDA under an  Emergency Use Authorization (EUA). This EUA will remain in effect (meaning this test can be used) for the duration of the COVID-19 declaration under Section 564(b)(1) of the Act, 21 U.S.C. section 360bbb-3(b)(1), unless the authorization is terminated or revoked.  Performed at [facility], [place] [address], [place], [place] [address]     FURTHER DISCHARGE INSTRUCTIONS:  Get Medicines reviewed and adjusted: Please take all your medications with you for your next visit with your Primary MD  Laboratory/radiological data: Please request your Primary MD to go over all hospital tests and procedure/radiological results at the follow up, please ask your Primary MD to get all Hospital records sent to  his/her office.  In some cases, they will be blood work, cultures and biopsy results pending at the time of your discharge. Please request that your primary care M.D. goes through all the records of your hospital data and follows up on these results.  Also Note the following: If you experience worsening of your admission symptoms, develop shortness of breath, life threatening emergency, suicidal or homicidal thoughts you must seek medical attention immediately by calling 911 or calling your MD immediately  if symptoms less severe.  You must read complete instructions/literature along with all the possible adverse reactions/side effects for all the Medicines you take and that have been prescribed to you. Take any new Medicines after you have completely understood and accpet all the possible adverse reactions/side effects.   Do not drive when taking Pain medications or sleeping medications (Benzodaizepines)  Do not take more than prescribed Pain, Sleep and Anxiety Medications. It is not advisable to combine anxiety,sleep and pain medications without talking with your primary care practitioner  Special Instructions: If you have smoked or chewed Tobacco  in the last 2 yrs please stop smoking, stop any regular Alcohol  and or any Recreational drug use.  Wear Seat belts while driving.  Please note: You were cared for by a hospitalist during your hospital stay. Once you are discharged, your primary care physician will handle any further medical issues. Please note that NO REFILLS for any discharge medications will be authorized once you are discharged, as it is imperative that you return to your primary care physician (or establish a relationship with a primary care physician if you do not have one) for your post hospital discharge needs so that they can reassess your need for medications and monitor your lab values.  Total Time spent coordinating discharge including counseling, education and face to  face time equals 35 minutes.  SignedOren Binet 02/18/2020 10:17 AM

## 2020-02-17 NOTE — Progress Notes (Signed)
Tried calling report to nurse at [facility], no answer. Left message with my phone number with secretary.

## 2020-02-17 NOTE — Progress Notes (Signed)
Spoke to [employer] at [facility] and gave report, no questions at this time

## 2020-02-17 NOTE — Progress Notes (Signed)
Tried calling [facility] to give report. Nurse did not answer phone. Will try again

## 2020-02-17 NOTE — Progress Notes (Signed)
Tried calling report to nurse at [facility], no answer.

## 2020-02-17 NOTE — TOC Progression Note (Signed)
Transition of Care [facility]) - Progression Note    Patient Details  Name: Kristin Cook MRN: [id number] Date of Birth: April 25, 1931  Transition of Care [facility] CM/SW Contact  Mearl Latin, LCSW Phone Number: 02/17/2020, 9:36 AM  Clinical Narrative:    CSW received insurance approval from [place]: #[id number]/ Ref # [id number], effective 02/16/20-02/20/20. CSW updated patient's son, [name], that patient will be transported by [facility] today.    Expected Discharge Plan: Skilled Nursing Facility Barriers to Discharge: Continued Medical Work up,Insurance Authorization,SNF Pending bed offer  Expected Discharge Plan and Services Expected Discharge Plan: Skilled Nursing Facility         Expected Discharge Date: 02/17/20                                     Social Determinants of Health (SDOH) Interventions    Readmission Risk Interventions No flowsheet data found.

## 2020-02-18 DIAGNOSIS — Z743 Need for continuous supervision: Secondary | ICD-10-CM | POA: Diagnosis not present

## 2020-02-18 DIAGNOSIS — Z7401 Bed confinement status: Secondary | ICD-10-CM | POA: Diagnosis not present

## 2020-02-18 DIAGNOSIS — M4856XS Collapsed vertebra, not elsewhere classified, lumbar region, sequela of fracture: Secondary | ICD-10-CM | POA: Diagnosis not present

## 2020-02-18 DIAGNOSIS — D72828 Other elevated white blood cell count: Secondary | ICD-10-CM | POA: Diagnosis not present

## 2020-02-18 DIAGNOSIS — J849 Interstitial pulmonary disease, unspecified: Secondary | ICD-10-CM | POA: Diagnosis not present

## 2020-02-18 DIAGNOSIS — R2689 Other abnormalities of gait and mobility: Secondary | ICD-10-CM | POA: Diagnosis not present

## 2020-02-18 DIAGNOSIS — M6281 Muscle weakness (generalized): Secondary | ICD-10-CM | POA: Diagnosis not present

## 2020-02-18 DIAGNOSIS — M25562 Pain in left knee: Secondary | ICD-10-CM | POA: Diagnosis not present

## 2020-02-18 DIAGNOSIS — R2681 Unsteadiness on feet: Secondary | ICD-10-CM | POA: Diagnosis not present

## 2020-02-18 DIAGNOSIS — J9601 Acute respiratory failure with hypoxia: Secondary | ICD-10-CM | POA: Diagnosis not present

## 2020-02-18 DIAGNOSIS — R7989 Other specified abnormal findings of blood chemistry: Secondary | ICD-10-CM | POA: Diagnosis not present

## 2020-02-18 DIAGNOSIS — M2550 Pain in unspecified joint: Secondary | ICD-10-CM | POA: Diagnosis not present

## 2020-02-18 DIAGNOSIS — E7849 Other hyperlipidemia: Secondary | ICD-10-CM | POA: Diagnosis not present

## 2020-02-18 DIAGNOSIS — I10 Essential (primary) hypertension: Secondary | ICD-10-CM | POA: Diagnosis not present

## 2020-02-18 DIAGNOSIS — R799 Abnormal finding of blood chemistry, unspecified: Secondary | ICD-10-CM | POA: Diagnosis not present

## 2020-02-18 DIAGNOSIS — I272 Pulmonary hypertension, unspecified: Secondary | ICD-10-CM | POA: Diagnosis not present

## 2020-02-18 DIAGNOSIS — D72829 Elevated white blood cell count, unspecified: Secondary | ICD-10-CM | POA: Diagnosis not present

## 2020-02-18 DIAGNOSIS — S22080D Wedge compression fracture of T11-T12 vertebra, subsequent encounter for fracture with routine healing: Secondary | ICD-10-CM | POA: Diagnosis not present

## 2020-02-18 DIAGNOSIS — R1312 Dysphagia, oropharyngeal phase: Secondary | ICD-10-CM | POA: Diagnosis not present

## 2020-02-18 DIAGNOSIS — E118 Type 2 diabetes mellitus with unspecified complications: Secondary | ICD-10-CM | POA: Diagnosis not present

## 2020-02-18 DIAGNOSIS — R6889 Other general symptoms and signs: Secondary | ICD-10-CM | POA: Diagnosis not present

## 2020-02-18 LAB — GLUCOSE, CAPILLARY: Glucose-Capillary: 181 mg/dL — ABNORMAL HIGH (ref 70–99)

## 2020-02-18 NOTE — Progress Notes (Signed)
[facility] has arrived to transport pt to [facility]. Pt alert and oriented x3 in no acute distress upon discharge. Pt's belongings taken with pt.

## 2020-02-18 NOTE — Progress Notes (Signed)
Patient was discharged yesterday to SNF-however due to transportation issues-she was not able to go to SNF yesterday.  She is medically stable to be discharged-discharge summary done yesterday remains up-to-date.

## 2020-02-20 ENCOUNTER — Telehealth: Payer: Self-pay

## 2020-02-20 DIAGNOSIS — M25562 Pain in left knee: Secondary | ICD-10-CM | POA: Diagnosis not present

## 2020-02-20 NOTE — Telephone Encounter (Signed)
I called the pt. Per my TOC report I tried to schedule her for a hospital f/u but she stated she was still in the hospital and hung up. I called her back but she stated the same thing and I was unable to go over medications because pt. Hung up the phone before I could.

## 2020-02-21 DIAGNOSIS — J849 Interstitial pulmonary disease, unspecified: Secondary | ICD-10-CM | POA: Diagnosis not present

## 2020-02-21 DIAGNOSIS — I272 Pulmonary hypertension, unspecified: Secondary | ICD-10-CM | POA: Diagnosis not present

## 2020-02-21 DIAGNOSIS — J9601 Acute respiratory failure with hypoxia: Secondary | ICD-10-CM | POA: Diagnosis not present

## 2020-02-22 DIAGNOSIS — R7989 Other specified abnormal findings of blood chemistry: Secondary | ICD-10-CM | POA: Diagnosis not present

## 2020-02-23 DIAGNOSIS — J9601 Acute respiratory failure with hypoxia: Secondary | ICD-10-CM | POA: Diagnosis not present

## 2020-02-23 DIAGNOSIS — I10 Essential (primary) hypertension: Secondary | ICD-10-CM | POA: Diagnosis not present

## 2020-02-23 DIAGNOSIS — D72828 Other elevated white blood cell count: Secondary | ICD-10-CM | POA: Diagnosis not present

## 2020-02-24 DIAGNOSIS — D72829 Elevated white blood cell count, unspecified: Secondary | ICD-10-CM | POA: Diagnosis not present

## 2020-02-28 DIAGNOSIS — D72828 Other elevated white blood cell count: Secondary | ICD-10-CM | POA: Diagnosis not present

## 2020-02-28 DIAGNOSIS — S22080D Wedge compression fracture of T11-T12 vertebra, subsequent encounter for fracture with routine healing: Secondary | ICD-10-CM | POA: Diagnosis not present

## 2020-02-28 DIAGNOSIS — I10 Essential (primary) hypertension: Secondary | ICD-10-CM | POA: Diagnosis not present

## 2020-02-29 DIAGNOSIS — R7989 Other specified abnormal findings of blood chemistry: Secondary | ICD-10-CM | POA: Diagnosis not present

## 2020-02-29 DIAGNOSIS — M4856XS Collapsed vertebra, not elsewhere classified, lumbar region, sequela of fracture: Secondary | ICD-10-CM | POA: Diagnosis not present

## 2020-03-01 DIAGNOSIS — R799 Abnormal finding of blood chemistry, unspecified: Secondary | ICD-10-CM | POA: Diagnosis not present

## 2020-03-01 DIAGNOSIS — E7849 Other hyperlipidemia: Secondary | ICD-10-CM | POA: Diagnosis not present

## 2020-03-01 DIAGNOSIS — E118 Type 2 diabetes mellitus with unspecified complications: Secondary | ICD-10-CM | POA: Diagnosis not present

## 2020-03-01 DIAGNOSIS — I10 Essential (primary) hypertension: Secondary | ICD-10-CM | POA: Diagnosis not present

## 2020-03-06 ENCOUNTER — Telehealth: Payer: Self-pay | Admitting: Family Medicine

## 2020-03-06 DIAGNOSIS — M1990 Unspecified osteoarthritis, unspecified site: Secondary | ICD-10-CM | POA: Diagnosis not present

## 2020-03-06 DIAGNOSIS — G9341 Metabolic encephalopathy: Secondary | ICD-10-CM | POA: Diagnosis not present

## 2020-03-06 DIAGNOSIS — I10 Essential (primary) hypertension: Secondary | ICD-10-CM | POA: Diagnosis not present

## 2020-03-06 DIAGNOSIS — E1165 Type 2 diabetes mellitus with hyperglycemia: Secondary | ICD-10-CM | POA: Diagnosis not present

## 2020-03-06 DIAGNOSIS — Z9181 History of falling: Secondary | ICD-10-CM | POA: Diagnosis not present

## 2020-03-06 DIAGNOSIS — M8088XD Other osteoporosis with current pathological fracture, vertebra(e), subsequent encounter for fracture with routine healing: Secondary | ICD-10-CM | POA: Diagnosis not present

## 2020-03-06 DIAGNOSIS — Z7984 Long term (current) use of oral hypoglycemic drugs: Secondary | ICD-10-CM | POA: Diagnosis not present

## 2020-03-06 DIAGNOSIS — I479 Paroxysmal tachycardia, unspecified: Secondary | ICD-10-CM | POA: Diagnosis not present

## 2020-03-06 DIAGNOSIS — F17210 Nicotine dependence, cigarettes, uncomplicated: Secondary | ICD-10-CM | POA: Diagnosis not present

## 2020-03-06 NOTE — Telephone Encounter (Signed)
[facility] called and is requesting verbal orders for PT 1 time a week for 1 week  2 times a week for 4 weeks 1 times a week for 2 weeks Antony Contras can be reached at [phone]

## 2020-03-06 NOTE — Telephone Encounter (Signed)
Called and informed Antony Contras

## 2020-03-06 NOTE — Telephone Encounter (Signed)
ok 

## 2020-03-07 ENCOUNTER — Telehealth: Payer: Self-pay | Admitting: Family Medicine

## 2020-03-07 DIAGNOSIS — Z9181 History of falling: Secondary | ICD-10-CM | POA: Diagnosis not present

## 2020-03-07 DIAGNOSIS — M8088XD Other osteoporosis with current pathological fracture, vertebra(e), subsequent encounter for fracture with routine healing: Secondary | ICD-10-CM | POA: Diagnosis not present

## 2020-03-07 DIAGNOSIS — E1165 Type 2 diabetes mellitus with hyperglycemia: Secondary | ICD-10-CM | POA: Diagnosis not present

## 2020-03-07 DIAGNOSIS — Z7984 Long term (current) use of oral hypoglycemic drugs: Secondary | ICD-10-CM | POA: Diagnosis not present

## 2020-03-07 DIAGNOSIS — F17210 Nicotine dependence, cigarettes, uncomplicated: Secondary | ICD-10-CM | POA: Diagnosis not present

## 2020-03-07 DIAGNOSIS — I10 Essential (primary) hypertension: Secondary | ICD-10-CM | POA: Diagnosis not present

## 2020-03-07 DIAGNOSIS — M1990 Unspecified osteoarthritis, unspecified site: Secondary | ICD-10-CM | POA: Diagnosis not present

## 2020-03-07 DIAGNOSIS — I479 Paroxysmal tachycardia, unspecified: Secondary | ICD-10-CM | POA: Diagnosis not present

## 2020-03-07 DIAGNOSIS — G9341 Metabolic encephalopathy: Secondary | ICD-10-CM | POA: Diagnosis not present

## 2020-03-07 NOTE — Telephone Encounter (Signed)
Sharyl Nimrod with [facility] [phone]  Patient had Home Health OT evaluation and does not require any additional visits

## 2020-03-08 ENCOUNTER — Telehealth: Payer: Self-pay | Admitting: Family Medicine

## 2020-03-08 DIAGNOSIS — G9341 Metabolic encephalopathy: Secondary | ICD-10-CM | POA: Diagnosis not present

## 2020-03-08 DIAGNOSIS — E1165 Type 2 diabetes mellitus with hyperglycemia: Secondary | ICD-10-CM | POA: Diagnosis not present

## 2020-03-08 DIAGNOSIS — I479 Paroxysmal tachycardia, unspecified: Secondary | ICD-10-CM | POA: Diagnosis not present

## 2020-03-08 DIAGNOSIS — I10 Essential (primary) hypertension: Secondary | ICD-10-CM | POA: Diagnosis not present

## 2020-03-08 DIAGNOSIS — M8088XD Other osteoporosis with current pathological fracture, vertebra(e), subsequent encounter for fracture with routine healing: Secondary | ICD-10-CM | POA: Diagnosis not present

## 2020-03-08 DIAGNOSIS — Z7984 Long term (current) use of oral hypoglycemic drugs: Secondary | ICD-10-CM | POA: Diagnosis not present

## 2020-03-08 DIAGNOSIS — M1990 Unspecified osteoarthritis, unspecified site: Secondary | ICD-10-CM | POA: Diagnosis not present

## 2020-03-08 DIAGNOSIS — F17210 Nicotine dependence, cigarettes, uncomplicated: Secondary | ICD-10-CM | POA: Diagnosis not present

## 2020-03-08 DIAGNOSIS — Z9181 History of falling: Secondary | ICD-10-CM | POA: Diagnosis not present

## 2020-03-08 NOTE — Telephone Encounter (Signed)
Verbal orders given  

## 2020-03-08 NOTE — Telephone Encounter (Signed)
ok 

## 2020-03-08 NOTE — Telephone Encounter (Signed)
Sonal, Speech therapist with [facility] called. She is requesting speech therapy for the following:  2 times a week for 4 weeks   1 time a week for 2 weeks  Please advise Sonal at [phone]

## 2020-03-12 DIAGNOSIS — I10 Essential (primary) hypertension: Secondary | ICD-10-CM | POA: Diagnosis not present

## 2020-03-12 DIAGNOSIS — U071 COVID-19: Secondary | ICD-10-CM | POA: Diagnosis not present

## 2020-03-12 DIAGNOSIS — Z9181 History of falling: Secondary | ICD-10-CM | POA: Diagnosis not present

## 2020-03-12 DIAGNOSIS — M1990 Unspecified osteoarthritis, unspecified site: Secondary | ICD-10-CM | POA: Diagnosis not present

## 2020-03-12 DIAGNOSIS — M8088XD Other osteoporosis with current pathological fracture, vertebra(e), subsequent encounter for fracture with routine healing: Secondary | ICD-10-CM | POA: Diagnosis not present

## 2020-03-12 DIAGNOSIS — E1165 Type 2 diabetes mellitus with hyperglycemia: Secondary | ICD-10-CM | POA: Diagnosis not present

## 2020-03-12 DIAGNOSIS — F17210 Nicotine dependence, cigarettes, uncomplicated: Secondary | ICD-10-CM | POA: Diagnosis not present

## 2020-03-12 DIAGNOSIS — Z7984 Long term (current) use of oral hypoglycemic drugs: Secondary | ICD-10-CM | POA: Diagnosis not present

## 2020-03-12 DIAGNOSIS — G9341 Metabolic encephalopathy: Secondary | ICD-10-CM | POA: Diagnosis not present

## 2020-03-12 DIAGNOSIS — I479 Paroxysmal tachycardia, unspecified: Secondary | ICD-10-CM | POA: Diagnosis not present

## 2020-03-13 DIAGNOSIS — I10 Essential (primary) hypertension: Secondary | ICD-10-CM | POA: Diagnosis not present

## 2020-03-13 DIAGNOSIS — M8088XD Other osteoporosis with current pathological fracture, vertebra(e), subsequent encounter for fracture with routine healing: Secondary | ICD-10-CM | POA: Diagnosis not present

## 2020-03-13 DIAGNOSIS — G9341 Metabolic encephalopathy: Secondary | ICD-10-CM | POA: Diagnosis not present

## 2020-03-13 DIAGNOSIS — Z9181 History of falling: Secondary | ICD-10-CM | POA: Diagnosis not present

## 2020-03-13 DIAGNOSIS — U071 COVID-19: Secondary | ICD-10-CM | POA: Diagnosis not present

## 2020-03-13 DIAGNOSIS — M1990 Unspecified osteoarthritis, unspecified site: Secondary | ICD-10-CM | POA: Diagnosis not present

## 2020-03-13 DIAGNOSIS — Z7984 Long term (current) use of oral hypoglycemic drugs: Secondary | ICD-10-CM | POA: Diagnosis not present

## 2020-03-13 DIAGNOSIS — E1165 Type 2 diabetes mellitus with hyperglycemia: Secondary | ICD-10-CM | POA: Diagnosis not present

## 2020-03-13 DIAGNOSIS — I479 Paroxysmal tachycardia, unspecified: Secondary | ICD-10-CM | POA: Diagnosis not present

## 2020-03-13 DIAGNOSIS — F17210 Nicotine dependence, cigarettes, uncomplicated: Secondary | ICD-10-CM | POA: Diagnosis not present

## 2020-03-14 DIAGNOSIS — G9341 Metabolic encephalopathy: Secondary | ICD-10-CM | POA: Diagnosis not present

## 2020-03-14 DIAGNOSIS — M8088XD Other osteoporosis with current pathological fracture, vertebra(e), subsequent encounter for fracture with routine healing: Secondary | ICD-10-CM | POA: Diagnosis not present

## 2020-03-14 DIAGNOSIS — Z9181 History of falling: Secondary | ICD-10-CM | POA: Diagnosis not present

## 2020-03-14 DIAGNOSIS — Z7984 Long term (current) use of oral hypoglycemic drugs: Secondary | ICD-10-CM | POA: Diagnosis not present

## 2020-03-14 DIAGNOSIS — M1990 Unspecified osteoarthritis, unspecified site: Secondary | ICD-10-CM | POA: Diagnosis not present

## 2020-03-14 DIAGNOSIS — I10 Essential (primary) hypertension: Secondary | ICD-10-CM | POA: Diagnosis not present

## 2020-03-14 DIAGNOSIS — F17210 Nicotine dependence, cigarettes, uncomplicated: Secondary | ICD-10-CM | POA: Diagnosis not present

## 2020-03-14 DIAGNOSIS — U071 COVID-19: Secondary | ICD-10-CM | POA: Diagnosis not present

## 2020-03-14 DIAGNOSIS — E1165 Type 2 diabetes mellitus with hyperglycemia: Secondary | ICD-10-CM | POA: Diagnosis not present

## 2020-03-14 DIAGNOSIS — I479 Paroxysmal tachycardia, unspecified: Secondary | ICD-10-CM | POA: Diagnosis not present

## 2020-03-15 DIAGNOSIS — I479 Paroxysmal tachycardia, unspecified: Secondary | ICD-10-CM | POA: Diagnosis not present

## 2020-03-15 DIAGNOSIS — M8088XD Other osteoporosis with current pathological fracture, vertebra(e), subsequent encounter for fracture with routine healing: Secondary | ICD-10-CM | POA: Diagnosis not present

## 2020-03-15 DIAGNOSIS — U071 COVID-19: Secondary | ICD-10-CM | POA: Diagnosis not present

## 2020-03-15 DIAGNOSIS — E1165 Type 2 diabetes mellitus with hyperglycemia: Secondary | ICD-10-CM | POA: Diagnosis not present

## 2020-03-15 DIAGNOSIS — I10 Essential (primary) hypertension: Secondary | ICD-10-CM | POA: Diagnosis not present

## 2020-03-15 DIAGNOSIS — G9341 Metabolic encephalopathy: Secondary | ICD-10-CM | POA: Diagnosis not present

## 2020-03-15 DIAGNOSIS — Z9181 History of falling: Secondary | ICD-10-CM | POA: Diagnosis not present

## 2020-03-15 DIAGNOSIS — Z7984 Long term (current) use of oral hypoglycemic drugs: Secondary | ICD-10-CM | POA: Diagnosis not present

## 2020-03-15 DIAGNOSIS — F17210 Nicotine dependence, cigarettes, uncomplicated: Secondary | ICD-10-CM | POA: Diagnosis not present

## 2020-03-15 DIAGNOSIS — M1990 Unspecified osteoarthritis, unspecified site: Secondary | ICD-10-CM | POA: Diagnosis not present

## 2020-03-19 DIAGNOSIS — Z7984 Long term (current) use of oral hypoglycemic drugs: Secondary | ICD-10-CM | POA: Diagnosis not present

## 2020-03-19 DIAGNOSIS — F17210 Nicotine dependence, cigarettes, uncomplicated: Secondary | ICD-10-CM | POA: Diagnosis not present

## 2020-03-19 DIAGNOSIS — G9341 Metabolic encephalopathy: Secondary | ICD-10-CM | POA: Diagnosis not present

## 2020-03-19 DIAGNOSIS — I479 Paroxysmal tachycardia, unspecified: Secondary | ICD-10-CM | POA: Diagnosis not present

## 2020-03-19 DIAGNOSIS — E1165 Type 2 diabetes mellitus with hyperglycemia: Secondary | ICD-10-CM | POA: Diagnosis not present

## 2020-03-19 DIAGNOSIS — Z9181 History of falling: Secondary | ICD-10-CM | POA: Diagnosis not present

## 2020-03-19 DIAGNOSIS — M8088XD Other osteoporosis with current pathological fracture, vertebra(e), subsequent encounter for fracture with routine healing: Secondary | ICD-10-CM | POA: Diagnosis not present

## 2020-03-19 DIAGNOSIS — M1990 Unspecified osteoarthritis, unspecified site: Secondary | ICD-10-CM | POA: Diagnosis not present

## 2020-03-19 DIAGNOSIS — I10 Essential (primary) hypertension: Secondary | ICD-10-CM | POA: Diagnosis not present

## 2020-03-19 DIAGNOSIS — U071 COVID-19: Secondary | ICD-10-CM | POA: Diagnosis not present

## 2020-03-20 DIAGNOSIS — M1990 Unspecified osteoarthritis, unspecified site: Secondary | ICD-10-CM | POA: Diagnosis not present

## 2020-03-20 DIAGNOSIS — U071 COVID-19: Secondary | ICD-10-CM | POA: Diagnosis not present

## 2020-03-20 DIAGNOSIS — Z9181 History of falling: Secondary | ICD-10-CM | POA: Diagnosis not present

## 2020-03-20 DIAGNOSIS — Z7984 Long term (current) use of oral hypoglycemic drugs: Secondary | ICD-10-CM | POA: Diagnosis not present

## 2020-03-20 DIAGNOSIS — I10 Essential (primary) hypertension: Secondary | ICD-10-CM | POA: Diagnosis not present

## 2020-03-20 DIAGNOSIS — M8088XD Other osteoporosis with current pathological fracture, vertebra(e), subsequent encounter for fracture with routine healing: Secondary | ICD-10-CM | POA: Diagnosis not present

## 2020-03-20 DIAGNOSIS — I479 Paroxysmal tachycardia, unspecified: Secondary | ICD-10-CM | POA: Diagnosis not present

## 2020-03-20 DIAGNOSIS — E1165 Type 2 diabetes mellitus with hyperglycemia: Secondary | ICD-10-CM | POA: Diagnosis not present

## 2020-03-20 DIAGNOSIS — G9341 Metabolic encephalopathy: Secondary | ICD-10-CM | POA: Diagnosis not present

## 2020-03-20 DIAGNOSIS — F17210 Nicotine dependence, cigarettes, uncomplicated: Secondary | ICD-10-CM | POA: Diagnosis not present

## 2020-03-22 DIAGNOSIS — I10 Essential (primary) hypertension: Secondary | ICD-10-CM | POA: Diagnosis not present

## 2020-03-22 DIAGNOSIS — E1165 Type 2 diabetes mellitus with hyperglycemia: Secondary | ICD-10-CM | POA: Diagnosis not present

## 2020-03-22 DIAGNOSIS — M1990 Unspecified osteoarthritis, unspecified site: Secondary | ICD-10-CM | POA: Diagnosis not present

## 2020-03-22 DIAGNOSIS — F17210 Nicotine dependence, cigarettes, uncomplicated: Secondary | ICD-10-CM | POA: Diagnosis not present

## 2020-03-22 DIAGNOSIS — U071 COVID-19: Secondary | ICD-10-CM | POA: Diagnosis not present

## 2020-03-22 DIAGNOSIS — Z9181 History of falling: Secondary | ICD-10-CM | POA: Diagnosis not present

## 2020-03-22 DIAGNOSIS — G9341 Metabolic encephalopathy: Secondary | ICD-10-CM | POA: Diagnosis not present

## 2020-03-22 DIAGNOSIS — Z7984 Long term (current) use of oral hypoglycemic drugs: Secondary | ICD-10-CM | POA: Diagnosis not present

## 2020-03-22 DIAGNOSIS — M8088XD Other osteoporosis with current pathological fracture, vertebra(e), subsequent encounter for fracture with routine healing: Secondary | ICD-10-CM | POA: Diagnosis not present

## 2020-03-22 DIAGNOSIS — I479 Paroxysmal tachycardia, unspecified: Secondary | ICD-10-CM | POA: Diagnosis not present

## 2020-03-29 DIAGNOSIS — I479 Paroxysmal tachycardia, unspecified: Secondary | ICD-10-CM | POA: Diagnosis not present

## 2020-03-29 DIAGNOSIS — U071 COVID-19: Secondary | ICD-10-CM | POA: Diagnosis not present

## 2020-03-29 DIAGNOSIS — G9341 Metabolic encephalopathy: Secondary | ICD-10-CM | POA: Diagnosis not present

## 2020-03-29 DIAGNOSIS — Z9181 History of falling: Secondary | ICD-10-CM | POA: Diagnosis not present

## 2020-03-29 DIAGNOSIS — Z7984 Long term (current) use of oral hypoglycemic drugs: Secondary | ICD-10-CM | POA: Diagnosis not present

## 2020-03-29 DIAGNOSIS — M1990 Unspecified osteoarthritis, unspecified site: Secondary | ICD-10-CM | POA: Diagnosis not present

## 2020-03-29 DIAGNOSIS — M8088XD Other osteoporosis with current pathological fracture, vertebra(e), subsequent encounter for fracture with routine healing: Secondary | ICD-10-CM | POA: Diagnosis not present

## 2020-03-29 DIAGNOSIS — I10 Essential (primary) hypertension: Secondary | ICD-10-CM | POA: Diagnosis not present

## 2020-03-29 DIAGNOSIS — E1165 Type 2 diabetes mellitus with hyperglycemia: Secondary | ICD-10-CM | POA: Diagnosis not present

## 2020-03-29 DIAGNOSIS — F17210 Nicotine dependence, cigarettes, uncomplicated: Secondary | ICD-10-CM | POA: Diagnosis not present

## 2020-03-30 ENCOUNTER — Telehealth: Payer: Self-pay | Admitting: Family Medicine

## 2020-03-30 NOTE — Telephone Encounter (Signed)
Pts speech therapist called and wanted a verbal ok to change pts visit to next week because pt canceled visit for this week. Verbal given

## 2020-03-30 NOTE — Telephone Encounter (Signed)
ok 

## 2020-04-03 DIAGNOSIS — Z9181 History of falling: Secondary | ICD-10-CM | POA: Diagnosis not present

## 2020-04-03 DIAGNOSIS — F17210 Nicotine dependence, cigarettes, uncomplicated: Secondary | ICD-10-CM | POA: Diagnosis not present

## 2020-04-03 DIAGNOSIS — Z7984 Long term (current) use of oral hypoglycemic drugs: Secondary | ICD-10-CM | POA: Diagnosis not present

## 2020-04-03 DIAGNOSIS — E1165 Type 2 diabetes mellitus with hyperglycemia: Secondary | ICD-10-CM | POA: Diagnosis not present

## 2020-04-03 DIAGNOSIS — U071 COVID-19: Secondary | ICD-10-CM | POA: Diagnosis not present

## 2020-04-03 DIAGNOSIS — I10 Essential (primary) hypertension: Secondary | ICD-10-CM | POA: Diagnosis not present

## 2020-04-03 DIAGNOSIS — I479 Paroxysmal tachycardia, unspecified: Secondary | ICD-10-CM | POA: Diagnosis not present

## 2020-04-03 DIAGNOSIS — M1990 Unspecified osteoarthritis, unspecified site: Secondary | ICD-10-CM | POA: Diagnosis not present

## 2020-04-03 DIAGNOSIS — M8088XD Other osteoporosis with current pathological fracture, vertebra(e), subsequent encounter for fracture with routine healing: Secondary | ICD-10-CM | POA: Diagnosis not present

## 2020-04-03 DIAGNOSIS — G9341 Metabolic encephalopathy: Secondary | ICD-10-CM | POA: Diagnosis not present

## 2020-04-04 DIAGNOSIS — Z7984 Long term (current) use of oral hypoglycemic drugs: Secondary | ICD-10-CM | POA: Diagnosis not present

## 2020-04-04 DIAGNOSIS — Z9181 History of falling: Secondary | ICD-10-CM | POA: Diagnosis not present

## 2020-04-04 DIAGNOSIS — I479 Paroxysmal tachycardia, unspecified: Secondary | ICD-10-CM | POA: Diagnosis not present

## 2020-04-04 DIAGNOSIS — G9341 Metabolic encephalopathy: Secondary | ICD-10-CM | POA: Diagnosis not present

## 2020-04-04 DIAGNOSIS — M8088XD Other osteoporosis with current pathological fracture, vertebra(e), subsequent encounter for fracture with routine healing: Secondary | ICD-10-CM | POA: Diagnosis not present

## 2020-04-04 DIAGNOSIS — M1990 Unspecified osteoarthritis, unspecified site: Secondary | ICD-10-CM | POA: Diagnosis not present

## 2020-04-04 DIAGNOSIS — U071 COVID-19: Secondary | ICD-10-CM | POA: Diagnosis not present

## 2020-04-04 DIAGNOSIS — I10 Essential (primary) hypertension: Secondary | ICD-10-CM | POA: Diagnosis not present

## 2020-04-04 DIAGNOSIS — F17210 Nicotine dependence, cigarettes, uncomplicated: Secondary | ICD-10-CM | POA: Diagnosis not present

## 2020-04-04 DIAGNOSIS — E1165 Type 2 diabetes mellitus with hyperglycemia: Secondary | ICD-10-CM | POA: Diagnosis not present

## 2020-04-05 DIAGNOSIS — M1990 Unspecified osteoarthritis, unspecified site: Secondary | ICD-10-CM | POA: Diagnosis not present

## 2020-04-05 DIAGNOSIS — Z9181 History of falling: Secondary | ICD-10-CM | POA: Diagnosis not present

## 2020-04-05 DIAGNOSIS — F17210 Nicotine dependence, cigarettes, uncomplicated: Secondary | ICD-10-CM | POA: Diagnosis not present

## 2020-04-05 DIAGNOSIS — G9341 Metabolic encephalopathy: Secondary | ICD-10-CM | POA: Diagnosis not present

## 2020-04-05 DIAGNOSIS — Z7984 Long term (current) use of oral hypoglycemic drugs: Secondary | ICD-10-CM | POA: Diagnosis not present

## 2020-04-05 DIAGNOSIS — I10 Essential (primary) hypertension: Secondary | ICD-10-CM | POA: Diagnosis not present

## 2020-04-05 DIAGNOSIS — E1165 Type 2 diabetes mellitus with hyperglycemia: Secondary | ICD-10-CM | POA: Diagnosis not present

## 2020-04-05 DIAGNOSIS — I479 Paroxysmal tachycardia, unspecified: Secondary | ICD-10-CM | POA: Diagnosis not present

## 2020-04-05 DIAGNOSIS — M8088XD Other osteoporosis with current pathological fracture, vertebra(e), subsequent encounter for fracture with routine healing: Secondary | ICD-10-CM | POA: Diagnosis not present

## 2020-04-05 DIAGNOSIS — U071 COVID-19: Secondary | ICD-10-CM | POA: Diagnosis not present

## 2020-04-06 DIAGNOSIS — U071 COVID-19: Secondary | ICD-10-CM | POA: Diagnosis not present

## 2020-04-06 DIAGNOSIS — Z9181 History of falling: Secondary | ICD-10-CM | POA: Diagnosis not present

## 2020-04-06 DIAGNOSIS — G9341 Metabolic encephalopathy: Secondary | ICD-10-CM | POA: Diagnosis not present

## 2020-04-06 DIAGNOSIS — E1165 Type 2 diabetes mellitus with hyperglycemia: Secondary | ICD-10-CM | POA: Diagnosis not present

## 2020-04-06 DIAGNOSIS — I10 Essential (primary) hypertension: Secondary | ICD-10-CM | POA: Diagnosis not present

## 2020-04-06 DIAGNOSIS — Z7984 Long term (current) use of oral hypoglycemic drugs: Secondary | ICD-10-CM | POA: Diagnosis not present

## 2020-04-06 DIAGNOSIS — M1990 Unspecified osteoarthritis, unspecified site: Secondary | ICD-10-CM | POA: Diagnosis not present

## 2020-04-06 DIAGNOSIS — I479 Paroxysmal tachycardia, unspecified: Secondary | ICD-10-CM | POA: Diagnosis not present

## 2020-04-06 DIAGNOSIS — M8088XD Other osteoporosis with current pathological fracture, vertebra(e), subsequent encounter for fracture with routine healing: Secondary | ICD-10-CM | POA: Diagnosis not present

## 2020-04-06 DIAGNOSIS — F17210 Nicotine dependence, cigarettes, uncomplicated: Secondary | ICD-10-CM | POA: Diagnosis not present

## 2020-04-10 DIAGNOSIS — I479 Paroxysmal tachycardia, unspecified: Secondary | ICD-10-CM | POA: Diagnosis not present

## 2020-04-10 DIAGNOSIS — G9341 Metabolic encephalopathy: Secondary | ICD-10-CM | POA: Diagnosis not present

## 2020-04-10 DIAGNOSIS — U071 COVID-19: Secondary | ICD-10-CM | POA: Diagnosis not present

## 2020-04-10 DIAGNOSIS — M1990 Unspecified osteoarthritis, unspecified site: Secondary | ICD-10-CM | POA: Diagnosis not present

## 2020-04-10 DIAGNOSIS — Z7984 Long term (current) use of oral hypoglycemic drugs: Secondary | ICD-10-CM | POA: Diagnosis not present

## 2020-04-10 DIAGNOSIS — Z9181 History of falling: Secondary | ICD-10-CM | POA: Diagnosis not present

## 2020-04-10 DIAGNOSIS — F17210 Nicotine dependence, cigarettes, uncomplicated: Secondary | ICD-10-CM | POA: Diagnosis not present

## 2020-04-10 DIAGNOSIS — M8088XD Other osteoporosis with current pathological fracture, vertebra(e), subsequent encounter for fracture with routine healing: Secondary | ICD-10-CM | POA: Diagnosis not present

## 2020-04-10 DIAGNOSIS — I10 Essential (primary) hypertension: Secondary | ICD-10-CM | POA: Diagnosis not present

## 2020-04-10 DIAGNOSIS — E1165 Type 2 diabetes mellitus with hyperglycemia: Secondary | ICD-10-CM | POA: Diagnosis not present

## 2020-04-13 ENCOUNTER — Telehealth: Payer: Self-pay | Admitting: Family Medicine

## 2020-04-13 NOTE — Telephone Encounter (Signed)
Nelly Laurence Speech Therapist with [facility] [phone]  Patient cancelled speech therapy effective today, she wanted to let you know

## 2020-04-16 DIAGNOSIS — F17210 Nicotine dependence, cigarettes, uncomplicated: Secondary | ICD-10-CM | POA: Diagnosis not present

## 2020-04-16 DIAGNOSIS — I479 Paroxysmal tachycardia, unspecified: Secondary | ICD-10-CM | POA: Diagnosis not present

## 2020-04-16 DIAGNOSIS — E1165 Type 2 diabetes mellitus with hyperglycemia: Secondary | ICD-10-CM | POA: Diagnosis not present

## 2020-04-16 DIAGNOSIS — M8088XD Other osteoporosis with current pathological fracture, vertebra(e), subsequent encounter for fracture with routine healing: Secondary | ICD-10-CM | POA: Diagnosis not present

## 2020-04-16 DIAGNOSIS — M1990 Unspecified osteoarthritis, unspecified site: Secondary | ICD-10-CM | POA: Diagnosis not present

## 2020-04-16 DIAGNOSIS — Z7984 Long term (current) use of oral hypoglycemic drugs: Secondary | ICD-10-CM | POA: Diagnosis not present

## 2020-04-16 DIAGNOSIS — U071 COVID-19: Secondary | ICD-10-CM | POA: Diagnosis not present

## 2020-04-16 DIAGNOSIS — I10 Essential (primary) hypertension: Secondary | ICD-10-CM | POA: Diagnosis not present

## 2020-04-16 DIAGNOSIS — Z9181 History of falling: Secondary | ICD-10-CM | POA: Diagnosis not present

## 2020-04-16 DIAGNOSIS — G9341 Metabolic encephalopathy: Secondary | ICD-10-CM | POA: Diagnosis not present

## 2020-04-19 ENCOUNTER — Encounter: Payer: Self-pay | Admitting: Internal Medicine

## 2020-04-19 ENCOUNTER — Ambulatory Visit (INDEPENDENT_AMBULATORY_CARE_PROVIDER_SITE_OTHER): Payer: Medicare Other | Admitting: Internal Medicine

## 2020-04-19 ENCOUNTER — Other Ambulatory Visit: Payer: Self-pay

## 2020-04-19 VITALS — BP 122/70 | HR 89 | Ht 61.0 in | Wt 134.8 lb

## 2020-04-19 DIAGNOSIS — Z8616 Personal history of COVID-19: Secondary | ICD-10-CM

## 2020-04-19 DIAGNOSIS — R0902 Hypoxemia: Secondary | ICD-10-CM | POA: Diagnosis not present

## 2020-04-19 DIAGNOSIS — R9389 Abnormal findings on diagnostic imaging of other specified body structures: Secondary | ICD-10-CM | POA: Diagnosis not present

## 2020-04-19 NOTE — Patient Instructions (Addendum)
ICD-10-CM   1. History of 2019 novel coronavirus disease (COVID-19)  Z86.16   2. Exercise hypoxemia  R09.02   3. Abnormal CT of the chest  R93.89     Concern you have a condition called interstitial lung disease otherwise call pulmonary fibrosis It is very likely and possible that this is unrelated to Covid .  I think it was there before the Covid There are many varieties to this and the outlook and treatment depends on the varieties  Plan -Check QuantiFERON gold, ANA, rheumatoid factor, CCP, SSA, SSB, double-stranded DNA, SCL 70, hypersensitive pneumonitis panel blood test -Do high-resolution CT chest supine and prone -Do spirometry and DLCO  -Do overnight on room air at night  Follow-up -Return in the next few weeks to few months to see Dr. Marchelle Cook on a 30-minute visit for nonurgent follow-up

## 2020-04-19 NOTE — Progress Notes (Signed)
OV 04/19/2020  Subjective:  Patient ID: Kristin Cook, female , DOB: September 28, 1931 , age 87 y.o. , MRN: [id number] , ADDRESS: [address] [place] [address] PCP [name], MD Patient Care Team: [name], MD as PCP - General (Family Medicine)  This Provider for this visit: Treatment Team:  Attending Provider: Brand Males, MD    04/19/2020 -   Chief Complaint  Patient presents with  . Hospitalization Follow-up    Pt states she has been doing okay since she has been out of the hospital. Denies any complaints or concerns with her breathing, denies any complaints of cough, and no chest tightness.   Post Covid follow-up in this 87 year old female Concern for interstitial lung disease HPI Kristin Cook 87 y.o. -new consult evaluation in the pulmonary clinic referred by hospitalist.  She was admitted February 12, 2020 with back pain and T12 compression fracture.  Found to have incidental COVID.  CT scan of the abdomen pelvis at this time showed incidental reticulation abnormalities in the lung base that I personally visualized today and this concern for ILD.  Her echocardiogram showed ejection fraction 65 to 70% with right ventricular systolic pressure 64.4 mmHg.  Her Doppler lower extremities was negative for DVT.  She is treated for Covid with oxygen steroids and remdesivir.  At the time of discharge according to the discharge notes and her daughter-in-law who is here with her today oxygen was not needed.  She went to rehab and then home.  At this point in time she is stable without any respiratory complaints.  They are not fully clear as to why they are in the pulmonary clinic although the daughter-in-law did indicate there is something wrong with the imaging.  This appears to be the ILD abnormalities in the CT abdomen lung images from just over 2 months ago.  She denies any active respiratory complaints.  Walking desaturation test 185 feet x 3 laps in our  office.  However she only completed 1 lap and at the end of it she was just ready to go back to the room because she was tired.  She walked extremely slow with a cane.  Her resting pulse ox was 99% with a heart rate of 87/min.  At the end of the exertion she dropped 6 points and her pulse ox was 93% although still adequate technically but it was a drop of 6 points.  She got tachycardic 206/min.  She was not short of breath.  Daughter-in-law is somewhat reluctant to undergo testing but patient decided that she wanted to go through it   Results for Kristin Cook, Kristin Cook (MRN [id number]) as of 04/19/2020 15:54  Ref. Range 02/12/2020 14:53 02/13/2020 05:37 02/14/2020 08:38 02/15/2020 02:20 02/16/2020 00:39  Creatinine Latest Ref Range: 0.44 - 1.00 mg/dL  0.99 0.71 0.84 0.81   Results for Kristin Cook, Kristin Cook (MRN [id number]) as of 04/19/2020 15:54  Ref. Range 02/12/2020 12:08 02/13/2020 05:37 02/14/2020 08:38 02/15/2020 02:20 02/16/2020 00:39  Hemoglobin Latest Ref Range: 12.0 - 15.0 g/dL 13.7 15.0 14.3 13.4 14.3      has a past medical history of Arthritis, Cataract, Cystocele, Dementia ([place]), Diabetes mellitus, Dyslipidemia, History of colonic polyps, MRSA infection, Hyperlipidemia, Hypertension, Mental disorder, Neuropathic pain of lower extremity, OAB (overactive bladder), and Shortness of breath.   reports that she quit smoking about 2 months ago. Her smoking use included cigarettes. She has a 18.00 pack-year smoking history. She has never used smokeless tobacco.  Past Surgical History:  Procedure Laterality Date  . ABDOMINAL HYSTERECTOMY    . EYE SURGERY Bilateral    Cataract  . IR KYPHO LUMBAR INC FX REDUCE BONE BX UNI/BIL CANNULATION INC/IMAGING  02/14/2020  . PARS PLANA VITRECTOMY Left 03/15/2013   Procedure: PARS PLANA VITRECTOMY WITH 25 GAUGE LEFT EYE with Endolaser;  Surgeon: Hurman Horn, MD;  Location: [facility];  Service: Ophthalmology;  Laterality: Left;  [facility] VAGINAL PROLAPSE REPAIR  2007   Dr. Gaetano Net     Allergies  Allergen Reactions  . Penicillins Swelling  . Sulfa Antibiotics Swelling    Weak   . Sulfamethoxazole Other (See Comments)    Feels like her legs are weighted down    Immunization History  Administered Date(s) Administered  . DTaP 03/14/2002  . Fluad Quad(high Dose 65+) 01/18/2020  . Influenza Split 01/12/2013, 12/11/2014  . Influenza Whole 01/21/2007, 11/17/2007, 11/05/2010  . Influenza, High Dose Seasonal PF 11/11/2013, 12/10/2015, 12/03/2016, 02/01/2018, 12/24/2018  . Influenza-Unspecified 12/03/2016  . PFIZER(Purple Top)SARS-COV-2 Vaccination 04/22/2019, 05/13/2019, 01/18/2020  . Pneumococcal Conjugate-13 07/01/2016  . Pneumococcal Polysaccharide-23 09/07/2006, 05/10/2013  . Tdap 08/05/2012  . Zoster 03/23/2007  . Zoster Recombinat (Shingrix) 07/16/2016, 06/21/2018    Family History  Problem Relation Age of Onset  . Healthy Mother   . Diabetes Father   . Hypertension Neg Hx      Current Outpatient Medications:  .  acetaminophen (TYLENOL) 325 MG tablet, Take 2 tablets (650 mg total) by mouth every 6 (six) hours as needed for mild pain (or Fever >/= 101)., Disp: , Rfl:  .  albuterol (VENTOLIN HFA) 108 (90 Base) MCG/ACT inhaler, Inhale 2 puffs into the lungs every 6 (six) hours as needed for wheezing or shortness of breath., Disp: , Rfl:  .  amLODipine (NORVASC) 10 MG tablet, TAKE 1 TABLET(10 MG) BY MOUTH DAILY (Patient taking differently: Take 10 mg by mouth daily.), Disp: 90 tablet, Rfl: 3 .  calcium-vitamin D (OSCAL WITH D) 500-200 MG-UNIT tablet, Take 1 tablet by mouth 2 (two) times daily., Disp: , Rfl:  .  insulin aspart (NOVOLOG) 100 UNIT/ML injection, 0-15 Units, Subcutaneous, 3 times daily with meals CBG < 70: implement hypoglycemia protocol CBG 70 - 120: 0 units CBG 121 - 150: 2 units CBG 151 - 200: 3 units CBG 201 - 250: 5 units CBG 251 - 300: 8 units CBG 301 - 350: 11 units CBG 351 - 400: 15 units CBG > 400: call MD, Disp: 10 mL, Rfl: 11 .   lisinopril-hydrochlorothiazide (ZESTORETIC) 20-12.5 MG tablet, Take 1 tablet by mouth daily., Disp: 90 tablet, Rfl: 3 .  ONETOUCH ULTRA test strip, USE AS DIRECTED, Disp: 100 strip, Rfl: 3 .  pioglitazone-metformin (ACTOPLUS MET) 15-850 MG tablet, TAKE 1 TABLET BY MOUTH TWICE DAILY WITH A MEAL (Patient taking differently: Take 1 tablet by mouth daily.), Disp: 180 tablet, Rfl: 1 .  polyethylene glycol (MIRALAX / GLYCOLAX) 17 g packet, Take 17 g by mouth daily as needed for mild constipation., Disp: 14 each, Rfl: 0 .  rosuvastatin (CRESTOR) 20 MG tablet, TAKE 1 TABLET(20 MG) BY MOUTH DAILY (Patient taking differently: Take 20 mg by mouth daily.), Disp: 90 tablet, Rfl: 3 .  senna-docusate (SENOKOT-S) 8.6-50 MG tablet, Take 2 tablets by mouth 2 (two) times daily., Disp: , Rfl:       Objective:   Vitals:   04/19/20 1527  BP: 122/70  Pulse: 89  SpO2: 95%  Weight: 134 lb 12.8 oz (61.1 kg)  Height:  '5\' 1"'  (1.549 m)    Estimated body mass index is 25.47 kg/m as calculated from the following:   Height as of this encounter: '5\' 1"'  (1.549 m).   Weight as of this encounter: 134 lb 12.8 oz (61.1 kg).  '@WEIGHTCHANGE' @  [employer]   04/19/20 1527  Weight: 134 lb 12.8 oz (61.1 kg)     Physical Exam  General Appearance:    Alert, cooperative, no distress, appears stated age - older , Deconditioned looking - yes , OBESE  - no, Sitting on Wheelchair -  no  Head:    Normocephalic, without obvious abnormality, atraumatic  Eyes:    PERRL, conjunctiva/corneas clear,  Ears:    Normal TM's and external ear canals, both ears  Nose:   Nares normal, septum midline, mucosa normal, no drainage    or sinus tenderness. OXYGEN ON  - no . Patient is @ ra   Throat:   Lips, mucosa, and tongue normal; teeth and gums normal. Cyanosis on lips - no  Neck:   Supple, symmetrical, trachea midline, no adenopathy;    thyroid:  no enlargement/tenderness/nodules; no carotid   bruit or JVD  Back:     Symmetric, no  curvature, ROM normal, no CVA tenderness  Lungs:     Distress - no , Wheeze no, Barrell Chest - no, Purse lip breathing - no, Crackles -bilateral bibasal crackles present  Chest Wall:    No tenderness or deformity.    Heart:    Regular rate and rhythm, S1 and S2 normal, no rub   or gallop, Murmur - no  Breast Exam:    NOT DONE  Abdomen:     Soft, non-tender, bowel sounds active all four quadrants,    no masses, no organomegaly. Visceral obesity - no  Genitalia:   NOT DONE  Rectal:   NOT DONE  Extremities:   Extremities - normal, Has Cane - no, Clubbing - no, Edema - no  Pulses:   2+ and symmetric all extremities  Skin:   Stigmata of Connective Tissue Disease - no  Lymph nodes:   Cervical, supraclavicular, and axillary nodes normal  Psychiatric:  Neurologic:   Pleasant - yes, Anxious - no, Flat affect - no  CAm-ICU - neg, Alert and Oriented x 3 - yes, Moves all 4s - yes, Speech - normal, Cognition - intact.  Hard of hearing present in hearing aids present      Assessment:       ICD-10-CM   1. History of 2019 novel coronavirus disease (COVID-19)  Z86.16 Anti-DNA antibody, double-stranded    Antinuclear Antib (ANA)    QuantiFERON-TB Gold Plus    CT Chest High Resolution    Pulmonary function test    Cyclic citrul peptide antibody, IgG    Rheumatoid Factor    Hypersensitivity Pneumonitis    Pulse oximetry, overnight    Anti-scleroderma antibody    Sjogren's syndrome antibods(ssa + ssb)  2. Exercise hypoxemia  R09.02   3. Abnormal CT of the chest  R93.89        Plan:     Patient Instructions     ICD-10-CM   1. History of 2019 novel coronavirus disease (COVID-19)  Z86.16   2. Exercise hypoxemia  R09.02   3. Abnormal CT of the chest  R93.89     Concern you have a condition called interstitial lung disease otherwise call pulmonary fibrosis It is very likely and possible that this is unrelated to Covid .  I  think it was there before the Covid There are many varieties to this  and the outlook and treatment depends on the varieties  Plan -Check QuantiFERON gold, ANA, rheumatoid factor, CCP, SSA, SSB, double-stranded DNA, SCL 70, hypersensitive pneumonitis panel blood test -Do high-resolution CT chest supine and prone -Do spirometry and DLCO  -Do overnight on room air at night  Follow-up -Return in the next few weeks to few months to see Dr. Chase Caller on a 30-minute visit for nonurgent follow-up       SIGNATURE    Dr. Brand Males, M.D., F.C.C.P,  Pulmonary and Critical Care Medicine Staff Physician, [place] Director - Interstitial Lung Disease  Program  P[facility] [place], [place], [address]  Pager: [phone], If no answer or between  15:00h - 7:00h: call [phone] Telephone: [phone]  4:11 PM 04/19/2020

## 2020-04-20 ENCOUNTER — Other Ambulatory Visit: Payer: Medicare Other

## 2020-04-20 DIAGNOSIS — Z7689 Persons encountering health services in other specified circumstances: Secondary | ICD-10-CM | POA: Diagnosis not present

## 2020-04-20 DIAGNOSIS — Z8616 Personal history of COVID-19: Secondary | ICD-10-CM | POA: Diagnosis not present

## 2020-04-23 LAB — QUANTIFERON-TB GOLD PLUS
Mitogen-NIL: >10 IU/mL
NIL: 0.05 IU/mL
QuantiFERON-TB Gold Plus: NEGATIVE
TB1-NIL: <0 IU/mL
TB2-NIL: <0 IU/mL

## 2020-04-23 LAB — SJOGREN'S SYNDROME ANTIBODS(SSA + SSB)
SSA (Ro) (ENA) Antibody, IgG: <1 AI
SSB (La) (ENA) Antibody, IgG: <1 AI

## 2020-04-23 LAB — ANTI-DNA ANTIBODY, DOUBLE-STRANDED: ds DNA Ab: <1 IU/mL

## 2020-04-23 LAB — ANA: Anti Nuclear Antibody (ANA): NEGATIVE

## 2020-04-23 LAB — RHEUMATOID FACTOR: Rheumatoid fact SerPl-aCnc: <14 IU/mL (ref <14)

## 2020-04-23 LAB — ANTI-SCLERODERMA ANTIBODY: Scleroderma (Scl-70) (ENA) Antibody, IgG: <1 AI

## 2020-04-23 LAB — CYCLIC CITRUL PEPTIDE ANTIBODY, IGG: Cyclic Citrullin Peptide Ab: 130 UNITS — ABNORMAL HIGH

## 2020-04-24 ENCOUNTER — Telehealth: Payer: Self-pay | Admitting: Internal Medicine

## 2020-04-24 DIAGNOSIS — M069 Rheumatoid arthritis, unspecified: Secondary | ICD-10-CM

## 2020-04-24 NOTE — Telephone Encounter (Signed)
I have called and spoke with pt and she is aware of referral per MR.

## 2020-04-24 NOTE — Telephone Encounter (Signed)
Blood tests for ILD are negative -> except CCP which is indicative of possible associated rheumatoid arthritis  Plan  - refer rheumatology        has a past medical history of Arthritis, Cataract, Cystocele, Dementia ([facility]), Diabetes mellitus, Dyslipidemia, History of colonic polyps, MRSA infection, Hyperlipidemia, Hypertension, Mental disorder, Neuropathic pain of lower extremity, OAB (overactive bladder), and Shortness of breath.        Results for FIA, HEBERT (MRN [id number]) as of 04/24/2020 08:22  Ref. Range 04/20/2020 11:46  Anti Nuclear Antibody (ANA) Latest Ref Range: NEGATIVE  NEGATIVE  Cyclic Citrullin Peptide Ab Latest Units: UNITS 130 (H)  ds DNA Ab Latest Units: IU/mL <1  RA Latex Turbid. Latest Ref Range: <14 IU/mL <14  SSA (Ro) (ENA) Antibody, IgG Latest Ref Range: <1.0 NEG AI <1.0 NEG  SSB (La) (ENA) Antibody, IgG Latest Ref Range: <1.0 NEG AI <1.0 NEG  Scleroderma (Scl-70) (ENA) Antibody, IgG Latest Ref Range: <1.0 NEG AI <1.0 NEG  QUANTIFERON-TB GOLD PLUS Unknown Rpt  Mitogen-NIL Latest Units: IU/mL >10.00  NIL Latest Units: IU/mL 0.05  TB1-NIL Latest Units: IU/mL <0.00  TB2-NIL Latest Units: IU/mL <0.00    Ref Range & Units 4 d ago  QuantiFERON-TB Gold Plus NEGATIVE NEGATIVE

## 2020-04-25 LAB — HYPERSENSITIVITY PNEUMONITIS
A. Pullulans Abs: NEGATIVE
A.Fumigatus #1 Abs: NEGATIVE
Micropolyspora faeni, IgG: NEGATIVE
Pigeon Serum Abs: NEGATIVE
Thermoact. Saccharii: NEGATIVE
Thermoactinomyces vulgaris, IgG: NEGATIVE

## 2020-04-27 ENCOUNTER — Ambulatory Visit (INDEPENDENT_AMBULATORY_CARE_PROVIDER_SITE_OTHER)
Admission: RE | Admit: 2020-04-27 | Discharge: 2020-04-27 | Disposition: A | Discharge status: Home / Self Care | Payer: Medicare Other | Source: Ambulatory Visit | Attending: Internal Medicine | Admitting: Internal Medicine

## 2020-04-27 ENCOUNTER — Other Ambulatory Visit: Payer: Self-pay

## 2020-04-27 DIAGNOSIS — Z8616 Personal history of COVID-19: Secondary | ICD-10-CM | POA: Diagnosis not present

## 2020-04-27 DIAGNOSIS — J8410 Pulmonary fibrosis, unspecified: Secondary | ICD-10-CM

## 2020-04-27 DIAGNOSIS — I700 Atherosclerosis of aorta: Secondary | ICD-10-CM | POA: Diagnosis not present

## 2020-04-27 DIAGNOSIS — R911 Solitary pulmonary nodule: Secondary | ICD-10-CM

## 2020-04-27 DIAGNOSIS — I2510 Atherosclerotic heart disease of native coronary artery without angina pectoris: Secondary | ICD-10-CM

## 2020-04-27 DIAGNOSIS — I358 Other nonrheumatic aortic valve disorders: Secondary | ICD-10-CM

## 2020-05-06 NOTE — Progress Notes (Signed)
Ct shows ild. Will review end of the month followup. LEt patient know she has pulmonary fibrosis as suspected

## 2020-05-25 ENCOUNTER — Other Ambulatory Visit: Payer: Self-pay

## 2020-05-25 ENCOUNTER — Emergency Department (HOSPITAL_COMMUNITY): Payer: Medicare Other

## 2020-05-25 ENCOUNTER — Inpatient Hospital Stay (HOSPITAL_COMMUNITY)
Admission: EM | Admit: 2020-05-25 | Discharge: 2020-05-29 | DRG: 175 | Disposition: A | Discharge status: Home Health | Payer: Medicare Other | Attending: Family Medicine | Admitting: Family Medicine

## 2020-05-25 ENCOUNTER — Ambulatory Visit (INDEPENDENT_AMBULATORY_CARE_PROVIDER_SITE_OTHER): Payer: Medicare Other | Admitting: Family Medicine

## 2020-05-25 VITALS — Wt 134.2 lb

## 2020-05-25 DIAGNOSIS — N3281 Overactive bladder: Secondary | ICD-10-CM | POA: Diagnosis present

## 2020-05-25 DIAGNOSIS — E785 Hyperlipidemia, unspecified: Secondary | ICD-10-CM | POA: Diagnosis present

## 2020-05-25 DIAGNOSIS — Z66 Do not resuscitate: Secondary | ICD-10-CM | POA: Diagnosis present

## 2020-05-25 DIAGNOSIS — R0602 Shortness of breath: Secondary | ICD-10-CM | POA: Diagnosis not present

## 2020-05-25 DIAGNOSIS — Z743 Need for continuous supervision: Secondary | ICD-10-CM | POA: Diagnosis not present

## 2020-05-25 DIAGNOSIS — R0902 Hypoxemia: Secondary | ICD-10-CM | POA: Diagnosis not present

## 2020-05-25 DIAGNOSIS — E119 Type 2 diabetes mellitus without complications: Secondary | ICD-10-CM | POA: Diagnosis not present

## 2020-05-25 DIAGNOSIS — I10 Essential (primary) hypertension: Secondary | ICD-10-CM

## 2020-05-25 DIAGNOSIS — Z8616 Personal history of COVID-19: Secondary | ICD-10-CM

## 2020-05-25 DIAGNOSIS — I110 Hypertensive heart disease with heart failure: Secondary | ICD-10-CM | POA: Diagnosis not present

## 2020-05-25 DIAGNOSIS — I50810 Right heart failure, unspecified: Secondary | ICD-10-CM | POA: Diagnosis not present

## 2020-05-25 DIAGNOSIS — J9601 Acute respiratory failure with hypoxia: Secondary | ICD-10-CM | POA: Diagnosis present

## 2020-05-25 DIAGNOSIS — J811 Chronic pulmonary edema: Secondary | ICD-10-CM | POA: Diagnosis not present

## 2020-05-25 DIAGNOSIS — Z86711 Personal history of pulmonary embolism: Secondary | ICD-10-CM

## 2020-05-25 DIAGNOSIS — F0390 Unspecified dementia without behavioral disturbance: Secondary | ICD-10-CM

## 2020-05-25 DIAGNOSIS — Z833 Family history of diabetes mellitus: Secondary | ICD-10-CM

## 2020-05-25 DIAGNOSIS — I2699 Other pulmonary embolism without acute cor pulmonale: Principal | ICD-10-CM | POA: Diagnosis present

## 2020-05-25 DIAGNOSIS — Z90710 Acquired absence of both cervix and uterus: Secondary | ICD-10-CM

## 2020-05-25 DIAGNOSIS — Z87891 Personal history of nicotine dependence: Secondary | ICD-10-CM | POA: Diagnosis not present

## 2020-05-25 DIAGNOSIS — Z20822 Contact with and (suspected) exposure to covid-19: Secondary | ICD-10-CM | POA: Diagnosis not present

## 2020-05-25 DIAGNOSIS — I361 Nonrheumatic tricuspid (valve) insufficiency: Secondary | ICD-10-CM | POA: Diagnosis not present

## 2020-05-25 DIAGNOSIS — E1165 Type 2 diabetes mellitus with hyperglycemia: Secondary | ICD-10-CM | POA: Diagnosis not present

## 2020-05-25 DIAGNOSIS — J189 Pneumonia, unspecified organism: Secondary | ICD-10-CM | POA: Diagnosis present

## 2020-05-25 DIAGNOSIS — I509 Heart failure, unspecified: Secondary | ICD-10-CM

## 2020-05-25 DIAGNOSIS — I517 Cardiomegaly: Secondary | ICD-10-CM | POA: Diagnosis not present

## 2020-05-25 DIAGNOSIS — E871 Hypo-osmolality and hyponatremia: Secondary | ICD-10-CM | POA: Diagnosis not present

## 2020-05-25 DIAGNOSIS — Z79899 Other long term (current) drug therapy: Secondary | ICD-10-CM

## 2020-05-25 DIAGNOSIS — R609 Edema, unspecified: Secondary | ICD-10-CM | POA: Diagnosis not present

## 2020-05-25 DIAGNOSIS — I342 Nonrheumatic mitral (valve) stenosis: Secondary | ICD-10-CM | POA: Diagnosis not present

## 2020-05-25 LAB — CBC WITH DIFFERENTIAL/PLATELET
Abs Immature Granulocytes: 0.04 10*3/uL (ref 0.00–0.07)
Basophils Absolute: 0.1 10*3/uL (ref 0.0–0.1)
Basophils Relative: 1 %
Eosinophils Absolute: 0.1 10*3/uL (ref 0.0–0.5)
Eosinophils Relative: 1 %
HCT: 35.9 % — ABNORMAL LOW (ref 36.0–46.0)
Hemoglobin: 12.3 g/dL (ref 12.0–15.0)
Immature Granulocytes: 1 %
Lymphocytes Relative: 15 %
Lymphs Abs: 1 10*3/uL (ref 0.7–4.0)
MCH: 30.6 pg (ref 26.0–34.0)
MCHC: 34.3 g/dL (ref 30.0–36.0)
MCV: 89.3 fL (ref 80.0–100.0)
Monocytes Absolute: 0.6 10*3/uL (ref 0.1–1.0)
Monocytes Relative: 9 %
Neutro Abs: 5 10*3/uL (ref 1.7–7.7)
Neutrophils Relative %: 73 %
Platelets: 303 10*3/uL (ref 150–400)
RBC: 4.02 MIL/uL (ref 3.87–5.11)
RDW: 14.5 % (ref 11.5–15.5)
WBC: 6.8 10*3/uL (ref 4.0–10.5)
nRBC: 0 % (ref 0.0–0.2)

## 2020-05-25 LAB — COMPREHENSIVE METABOLIC PANEL
ALT: 11 U/L (ref 0–44)
AST: 13 U/L — ABNORMAL LOW (ref 15–41)
Albumin: 3.3 g/dL — ABNORMAL LOW (ref 3.5–5.0)
Alkaline Phosphatase: 66 U/L (ref 38–126)
Anion gap: 9 (ref 5–15)
BUN: 16 mg/dL (ref 8–23)
CO2: 24 mmol/L (ref 22–32)
Calcium: 9 mg/dL (ref 8.9–10.3)
Chloride: 97 mmol/L — ABNORMAL LOW (ref 98–111)
Creatinine, Ser: 0.91 mg/dL (ref 0.44–1.00)
GFR, Estimated: >60 mL/min (ref >60)
Glucose, Bld: 206 mg/dL — ABNORMAL HIGH (ref 70–99)
Potassium: 4.6 mmol/L (ref 3.5–5.1)
Sodium: 130 mmol/L — ABNORMAL LOW (ref 135–145)
Total Bilirubin: 0.7 mg/dL (ref 0.3–1.2)
Total Protein: 6.3 g/dL — ABNORMAL LOW (ref 6.5–8.1)

## 2020-05-25 LAB — TROPONIN I (HIGH SENSITIVITY)
Troponin I (High Sensitivity): 11 ng/L (ref <18)
Troponin I (High Sensitivity): 12 ng/L (ref <18)

## 2020-05-25 LAB — SARS CORONAVIRUS 2 (TAT 6-24 HRS): SARS Coronavirus 2: NEGATIVE

## 2020-05-25 LAB — BRAIN NATRIURETIC PEPTIDE: B Natriuretic Peptide: 332.2 pg/mL — ABNORMAL HIGH (ref 0.0–100.0)

## 2020-05-25 MED ORDER — IOHEXOL 350 MG/ML SOLN
65.0000 mL | Freq: Once | INTRAVENOUS | Status: AC | PRN
  Administered 2020-05-25: 65 mL via INTRAVENOUS

## 2020-05-25 MED ORDER — HEPARIN BOLUS VIA INFUSION
4000.0000 [IU] | Freq: Once | INTRAVENOUS | Status: AC
  Administered 2020-05-25: 4000 [IU] via INTRAVENOUS
  Filled 2020-05-25: qty 4000

## 2020-05-25 MED ORDER — HEPARIN (PORCINE) 25000 UT/250ML-% IV SOLN
1250.0000 [IU]/h | INTRAVENOUS | Status: DC
  Administered 2020-05-25 – 2020-05-26 (×2): 1100 [IU]/h via INTRAVENOUS
  Filled 2020-05-25 (×3): qty 250

## 2020-05-25 MED ORDER — FUROSEMIDE 10 MG/ML IJ SOLN
20.0000 mg | Freq: Once | INTRAMUSCULAR | Status: AC
  Administered 2020-05-25: 20 mg via INTRAVENOUS
  Filled 2020-05-25: qty 2

## 2020-05-25 NOTE — ED Notes (Signed)
Pt oxygen saturation on room air was 86%, hence placed on nasal cannula at 2L. Oxygen saturation improved to 97%.

## 2020-05-25 NOTE — Progress Notes (Signed)
ANTICOAGULATION CONSULT NOTE - Initial Consult  Pharmacy Consult for heparin Indication: pulmonary embolus  Allergies  Allergen Reactions  . Penicillins Swelling  . Sulfa Antibiotics Swelling    Weak   . Sulfamethoxazole Other (See Comments)    Feels like her legs are weighted down    Patient Measurements: Height: 5\' 5"  (165.1 cm) Weight: 65.8 kg (145 lb) IBW/kg (Calculated) : 57  Vital Signs: Temp: 97.5 F (36.4 C) (03/18 1452) Temp Source: Oral (03/18 1452) BP: 155/70 (03/18 2136) Pulse Rate: 86 (03/18 2136)  Labs: Recent Labs    05/25/20 1508 05/25/20 1847  HGB 12.3  --   HCT 35.9*  --   PLT 303  --   CREATININE 0.91  --   TROPONINIHS 11 12    Estimated Creatinine Clearance: 39.9 mL/min (by C-G formula based on SCr of 0.91 mg/dL).   Medical History: Past Medical History:  Diagnosis Date  . Arthritis   . Cataract   . Cystocele   . Dementia ([facility])    forgetfulness  . Diabetes mellitus   . Dyslipidemia   . History of colonic polyps   . Hx MRSA infection   . Hyperlipidemia   . Hypertension   . Mental disorder   . Neuropathic pain of lower extremity   . OAB (overactive bladder)   . Shortness of breath    with exertion    Medications:  (Not in a hospital admission)   Assessment: 29 YOF with difficulty breathing diagnosed with chronic L lower lobe PE. Pharmacy consulted to start IV heparin.   Hgb wnl, Plt wnl. SCr wnl   Goal of Therapy:  Heparin level 0.3-0.7 units/ml Monitor platelets by anticoagulation protocol: Yes   Plan:  -Heparin 4000 units IV bolus followed by heparin infusion at 1100 units/hr. -F/u 8 hr HL -Monitor daily HL, CBC and s/s of bleeding   88, PharmD., BCPS, BCCCP Clinical Pharmacist Please refer to [facility] for unit-specific pharmacist

## 2020-05-25 NOTE — ED Provider Notes (Signed)
[facility] DEPARTMENT Provider Note   CSN: [id number] Arrival date & time:        History Chief Complaint  Patient presents with  . Shortness of Breath    Kristin Cook is a 87 y.o. female.  The history is provided by the patient and a relative (Daughter).        Level 5 caveat due to dementia.  Kristin Cook is a 87 y.o. female, with a history of dementia, DM, dyslipidemia, HTN, presenting to the ED with shortness of breath with exertion over the last couple days. Daughter states she has noted the patient having difficulty breathing and increased respiratory rate with walking short distances. She notes patient has had lower extremity edema bilaterally for the past 6 months. Patient was seen by PCP in the office today.  Once patient was brought to the exam room, they noted the patient was tachypneic and SPO2 74% on room air.  They noted rales in the bilateral lung bases. Patient lives with her daughter.  Daughter states patient was previously living in an apartment on the same property, but had difficulty with memory and confusion following admission and Covid in December 2021. Patient and her daughter deny fever, cough, chest pain, abdominal pain, syncope, dizziness, N/V/D, or any other complaints.   Past Medical History:  Diagnosis Date  . Arthritis   . Cataract   . Cystocele   . Dementia ([place])    forgetfulness  . Diabetes mellitus   . Dyslipidemia   . History of colonic polyps   . Hx MRSA infection   . Hyperlipidemia   . Hypertension   . Mental disorder   . Neuropathic pain of lower extremity   . OAB (overactive bladder)   . Shortness of breath    with exertion    Patient Active Problem List   Diagnosis Date Noted  . Compression fracture of lumbar spine, non-traumatic, initial encounter ([place]) 02/12/2020  . Hypoxia 02/12/2020  . COVID-19 virus infection 02/12/2020  . Mild cognitive impairment with memory loss 09/21/2018  .  Onychomycosis of multiple toenails with type 2 diabetes mellitus ([place]) 10/22/2015  . Presbycusis of both ears 06/19/2015  . Severe nonproliferative diabetic retinopathy of left eye with macular edema ([place]) 12/30/2011  . Arthritis 09/29/2011  . Controlled diabetes mellitus type 2 with complications ([place]) 84/16/6063  . Hypertension associated with diabetes ([place]) 11/05/2010  . Hyperlipidemia associated with type 2 diabetes mellitus ([place]) 11/05/2010    Past Surgical History:  Procedure Laterality Date  . ABDOMINAL HYSTERECTOMY    . EYE SURGERY Bilateral    Cataract  . IR KYPHO LUMBAR INC FX REDUCE BONE BX UNI/BIL CANNULATION INC/IMAGING  02/14/2020  . PARS PLANA VITRECTOMY Left 03/15/2013   Procedure: PARS PLANA VITRECTOMY WITH 25 GAUGE LEFT EYE with Endolaser;  Surgeon: Hurman Horn, MD;  Location: [facility];  Service: Ophthalmology;  Laterality: Left;  [facility] Kitchen VAGINAL PROLAPSE REPAIR  2007   Dr. Gaetano Net     OB History   No obstetric history on file.     Family History  Problem Relation Age of Onset  . Healthy Mother   . Diabetes Father   . Hypertension Neg Hx     Social History   Tobacco Use  . Smoking status: Former Smoker    Packs/day: 0.30    Years: 60.00    Pack years: 18.00    Types: Cigarettes    Quit date: 02/12/2020    Years since quitting:  0.2  . Smokeless tobacco: Never Used  Vaping Use  . Vaping Use: Never used  Substance Use Topics  . Alcohol use: No  . Drug use: No    Home Medications Prior to Admission medications   Medication Sig Start Date End Date Taking? Authorizing Provider  acetaminophen (TYLENOL) 325 MG tablet Take 2 tablets (650 mg total) by mouth every 6 (six) hours as needed for mild pain (or Fever >/= 101). 02/17/20   Ghimire, Henreitta Leber, MD  albuterol (VENTOLIN HFA) 108 (90 Base) MCG/ACT inhaler Inhale 2 puffs into the lungs every 6 (six) hours as needed for wheezing or shortness of breath. 02/17/20   Ghimire, Henreitta Leber, MD  amLODipine (NORVASC) 10 MG  tablet TAKE 1 TABLET(10 MG) BY MOUTH DAILY Patient taking differently: Take 10 mg by mouth daily. 09/13/19   [name], MD  calcium-vitamin D (OSCAL WITH D) 500-200 MG-UNIT tablet Take 1 tablet by mouth 2 (two) times daily. 02/17/20   Ghimire, Henreitta Leber, MD  insulin aspart (NOVOLOG) 100 UNIT/ML injection 0-15 Units, Subcutaneous, 3 times daily with meals CBG < 70: implement hypoglycemia protocol CBG 70 - 120: 0 units CBG 121 - 150: 2 units CBG 151 - 200: 3 units CBG 201 - 250: 5 units CBG 251 - 300: 8 units CBG 301 - 350: 11 units CBG 351 - 400: 15 units CBG > 400: call MD 02/17/20   Jonetta Osgood, MD  lisinopril-hydrochlorothiazide (ZESTORETIC) 20-12.5 MG tablet Take 1 tablet by mouth daily. 09/13/19   [name], MD  [facility] ULTRA test strip USE AS DIRECTED 06/27/19   [name], MD  pioglitazone-metformin (ACTOPLUS MET) [phone] MG tablet TAKE 1 TABLET BY MOUTH TWICE DAILY WITH A MEAL Patient taking differently: Take 1 tablet by mouth daily. 11/21/19   [name], MD  polyethylene glycol (MIRALAX / GLYCOLAX) 17 g packet Take 17 g by mouth daily as needed for mild constipation. 02/17/20   Ghimire, Henreitta Leber, MD  rosuvastatin (CRESTOR) 20 MG tablet TAKE 1 TABLET(20 MG) BY MOUTH DAILY Patient taking differently: Take 20 mg by mouth daily. 09/13/19   [name], MD  senna-docusate (SENOKOT-S) 8.6-50 MG tablet Take 2 tablets by mouth 2 (two) times daily. 02/17/20   Ghimire, Henreitta Leber, MD    Allergies    Penicillins, Sulfa antibiotics, and Sulfamethoxazole  Review of Systems   Review of Systems  Unable to perform ROS: Dementia  Constitutional: Negative for chills, diaphoresis and fever.  Respiratory: Positive for shortness of breath. Negative for cough.   Cardiovascular: Positive for leg swelling. Negative for chest pain.  Gastrointestinal: Negative for diarrhea, nausea and vomiting.  Neurological: Negative for syncope.    Physical Exam Updated Vital Signs BP  119/66   Pulse 86   Temp (!) 97.5 F (36.4 C) (Oral)   Resp 20   Ht _0  (1.651 m)   Wt 65.8 kg   SpO2 96%   BMI 24.13 kg/m   Physical Exam Vitals and nursing note reviewed.  Constitutional:      General: She is not in acute distress.    Appearance: She is well-developed. She is not diaphoretic.  HENT:     Head: Normocephalic and atraumatic.     Mouth/Throat:     Mouth: Mucous membranes are moist.     Pharynx: Oropharynx is clear.  Eyes:     Conjunctiva/sclera: Conjunctivae normal.  Cardiovascular:     Rate and Rhythm: Normal rate and regular rhythm.  Pulses: Normal pulses.          Radial pulses are 2+ on the right side and 2+ on the left side.       Posterior tibial pulses are 2+ on the right side and 2+ on the left side.     Heart sounds: Normal heart sounds.     Comments: Tactile temperature in the extremities appropriate and equal bilaterally. Pulmonary:     Effort: Pulmonary effort is normal. No respiratory distress.     Breath sounds: Normal breath sounds.     Comments: SPO2 drops to about 86% on room air with movement or mild exertion.  95% at rest. Abdominal:     Palpations: Abdomen is soft.     Tenderness: There is no abdominal tenderness. There is no guarding.  Musculoskeletal:     Cervical back: Neck supple.     Right lower leg: Edema present.     Left lower leg: Edema present.  Skin:    General: Skin is warm and dry.  Neurological:     Mental Status: She is alert.  Psychiatric:        Mood and Affect: Mood and affect normal.        Speech: Speech normal.        Behavior: Behavior normal.     ED Results / Procedures / Treatments   Labs (all labs ordered are listed, but only abnormal results are displayed) Labs Reviewed  COMPREHENSIVE METABOLIC PANEL - Abnormal; Notable for the following components:      Result Value   Sodium 130 (*)    Chloride 97 (*)    Glucose, Bld 206 (*)    Total Protein 6.3 (*)    Albumin 3.3 (*)    AST 13 (*)     All other components within normal limits  CBC WITH DIFFERENTIAL/PLATELET - Abnormal; Notable for the following components:   HCT 35.9 (*)    All other components within normal limits  BRAIN NATRIURETIC PEPTIDE - Abnormal; Notable for the following components:   B Natriuretic Peptide 332.2 (*)    All other components within normal limits  SARS CORONAVIRUS 2 (TAT 6-24 HRS)  URINALYSIS, ROUTINE W REFLEX MICROSCOPIC  HEPARIN LEVEL (UNFRACTIONATED)  CBC  TROPONIN I (HIGH SENSITIVITY)  TROPONIN I (HIGH SENSITIVITY)    EKG EKG Interpretation  Date/Time:  Friday May 25 2020 14:53:38 EDT Ventricular Rate:  83 PR Interval:    QRS Duration: 117 QT Interval:  414 QTC Calculation: 487 R Axis:   -69 Text Interpretation: Sinus rhythm Incomplete RBBB and LAFB Anteroseptal infarct, age indeterminate Confirmed by Lennice Sites [phone]) on 05/25/2020 4:03:11 PM   Radiology CT Angio Chest PE W and/or Wo Contrast  Result Date: 05/25/2020 CLINICAL DATA:  Shortness of breath for 2 days.  Hypoxia. EXAM: CT ANGIOGRAPHY CHEST WITH CONTRAST TECHNIQUE: Multidetector CT imaging of the chest was performed using the standard protocol during bolus administration of intravenous contrast. Multiplanar CT image reconstructions and MIPs were obtained to evaluate the vascular anatomy. CONTRAST:  53m OMNIPAQUE IOHEXOL 350 MG/ML SOLN COMPARISON:  Multiple exams, including 04/27/2020 FINDINGS: Cardiovascular: There is a small web in the left lower lobe pulmonary artery on images 141 through 157 of series 7 indicating remote/chronic pulmonary embolus, but no acute pulmonary embolus is demonstrated. Mild to moderate cardiomegaly. Mitral valve calcification. Coronary, aortic arch, and branch vessel atherosclerotic vascular disease. Mediastinum/Nodes: Subcarinal node 1.4 cm in short axis on image 49 series 6, previously 1.6 cm. Paratracheal node  0.9 cm in short axis on image 37 series 6, previously 0.8 cm. Prevascular node 0.9  cm in short axis on image 38 series 6, previously 0.8 cm. Lungs/Pleura: Bilateral interstitial accentuation with patchy ground-glass opacities. Although much of this background appearance is likely attributable to UIP, the ground-glass densities are increased from prior and fairly widespread, raising the possibility of a component of pulmonary edema, atypical pneumonia, acute hypersensitivity reaction, or acute eosinophilic pneumonia. Previous subpleural nodular area of concern in the left upper lobe is unchanged on image 46 of series 6, although somewhat obscured by surrounding additional opacities. Upper Abdomen: Unremarkable Musculoskeletal: Lower thoracic vertebral augmentation. Review of the MIP images confirms the above findings. IMPRESSION: 1. Small web in the left lower lobe pulmonary artery indicating remote/chronic pulmonary embolus, but no acute pulmonary embolus is demonstrated. 2. Bilateral interstitial accentuation with patchy ground-glass opacities. Although much of this background appearance is likely attributable to UIP, the ground-glass densities are increased from prior and fairly widespread, raising the possibility of a component of pulmonary edema, atypical pneumonia, acute hypersensitivity reaction, or acute eosinophilic pneumonia. 3. Mild to moderate cardiomegaly. Mitral valve calcification. 4. Coronary, aortic arch, and branch vessel atherosclerotic vascular disease. 5. The potential 8 mm nodule peripherally in the left upper lobe is unchanged over the past month. Original recommendation for 3 month follow up remains in effect. Aortic Atherosclerosis (ICD10-I70.0). Electronically Signed   By: Van Clines M.D.   On: 05/25/2020 20:57   DG Chest Portable 1 View  Result Date: 05/25/2020 CLINICAL DATA:  Shortness of breath for the past 2 days. EXAM: PORTABLE CHEST 1 VIEW COMPARISON:  CT chest dated April 27, 2020. FINDINGS: Unchanged borderline cardiomegaly. Similar appearing diffuse  interstitial thickening. No focal consolidation, pleural effusion, or pneumothorax. No acute osseous abnormality. Prior T12 cement augmentation. IMPRESSION: 1.  No active cardiopulmonary disease. 2. Unchanged chronic interstitial lung disease. Electronically Signed   By: Titus Dubin M.D.   On: 05/25/2020 16:36    Procedures .Critical Care Performed by: Lorayne Bender, PA-C Authorized by: Lorayne Bender, PA-C   Critical care provider statement:    Critical care time (minutes):  35   Critical care time was exclusive of:  Separately billable procedures and treating other patients   Critical care was necessary to treat or prevent imminent or life-threatening deterioration of the following conditions:  Respiratory failure   Critical care was time spent personally by me on the following activities:  Ordering and performing treatments and interventions, ordering and review of laboratory studies, ordering and review of radiographic studies, pulse oximetry, re-evaluation of patient's condition, review of old charts, examination of patient, evaluation of patient's response to treatment, discussions with consultants, development of treatment plan with patient or surrogate and obtaining history from patient or surrogate   I assumed direction of critical care for this patient from another provider in my specialty: no     Care discussed with: admitting provider       Medications Ordered in ED Medications  heparin bolus via infusion 4,000 Units (has no administration in time range)  heparin ADULT infusion 100 units/mL (25000 units/240mL) (has no administration in time range)  furosemide (LASIX) injection 20 mg (has no administration in time range)  iohexol (OMNIPAQUE) 350 MG/ML injection 65 mL (65 mLs Intravenous Contrast Given 05/25/20 2036)    ED Course  I have reviewed the triage vital signs and the nursing notes.  Pertinent labs & imaging results that were available during my care of  the patient  were reviewed by me and considered in my medical decision making (see chart for details).  Clinical Course as of 05/25/20 2232  Fri May 25, 2020  2135 CT Angio Chest PE W and/or Wo Contrast Spoke with Dr. Lamonte Sakai, Pulm/CCM.  We discussed the patient's symptoms as well as the CT PE study.  He reviewed this image himself. He recommends treating the patient as if she has an acute PE, starting heparin if there are no contraindications, and admitting the patient. [SJ]  2206 Spoke with Dr. Marlowe Sax, hospitalist. Agrees to admit the patient.  [SJ]    Clinical Course User Index [SJ] Wylene Weissman, Helane Gunther, PA-C   MDM Rules/Calculators/A&P                          Patient presents from PCP office due to exertional shortness of breath and hypoxia. Nontoxic-appearing, afebrile, not tachycardic. She does have tachypnea, increased work of breathing, and hypoxia down to 86% even while at rest, requiring supplemental O2. I personally reviewed and interpreted the patient's labs and imaging studies. Elevated BNP, lower extremity edema, suspect fluid overload.  Patient initially quite hesitant to allow admission, however, they agreed to allow CT PE study to be performed. CT shows evidence of PE with uncertain chronicity.  Also shows evidence of pulmonary edema versus multifocal pneumonia, however, patient has no leukocytosis eosinophilia, or fever, therefore my suspicion for infectious process is low.  Findings and plan of care discussed with attending physician, Adrian Prows, MD, and then with Dr. Ronnald Nian after EDP shift change. Dr. Vanita Panda personally evaluated and examined this patient.  Vitals:   05/25/20 2000 05/25/20 2052 05/25/20 2100 05/25/20 2136  BP: (!) 115/102 137/69 (!) 142/64 (!) 155/70  Pulse: 89 72 72 86  Resp: (!) 27 19 (!) 22 17  Temp:      TempSrc:      SpO2: 96% 99% 100% 94%  Weight:      Height:         Final Clinical Impression(s) / ED Diagnoses Final diagnoses:  SOB (shortness of  breath)  Hypoxia    Rx / DC Orders ED Discharge Orders    None       Layla Maw 05/25/20 2232    Carmin Muskrat, MD 05/28/20 1000

## 2020-05-25 NOTE — Progress Notes (Signed)
VAST consulted to obtain IV access. Upon arrival at patient's bedside at 1600, lab tech in process of sticking patient.  ED RN sought out VAST RN and stated patient in need of IV start and repeat lab draw.  Upon arrival at pt's bedside, VAST RN was asked why patient needed an IV. No IV fluids, meds or studies ordered. VAST RN sought out physician for further guidance. No IV needed at this time as patient requesting to leave hospital. MD aware.

## 2020-05-25 NOTE — ED Notes (Signed)
This RN unsuccessful at IV access, consulted Zoe RN; who was also unsuccessful,. IV team consult placed

## 2020-05-25 NOTE — ED Triage Notes (Signed)
Pt to ED via EMS from PCP c/o SHOB x 2 days. Pt went to PCP for apt. Per PCP pt 78%RA, so they called EMS. On EMS arrival 91%RA, Rales noted both lungs. Medical hx, cognitive deficit, DM, hyperthyroid. Last VS: 114/64, hr 84, 24 RR, 98% O2 2L. CBG 286

## 2020-05-25 NOTE — Progress Notes (Addendum)
   Subjective:    Patient ID: Kristin Cook, female    DOB: [birth year], 87 y.o.   MRN: [id number]  HPI She is here for evaluation of a 2-day history of increasing difficulty with shortness of breath but no chest pain, fatigue, diaphoresis.  She has had difficulty with a dry cough for the last 2 weeks.  She has a previous history of vertebral fracture on December 5 followed up on December 6 by diagnosis of COVID.  She was given oxygen for that and was sent to rehab.  She was in the rehab unit until December 26.  She has been staying at home since then.   Review of Systems     Objective:   Physical Exam Alert and quite tachypneic.  Pulse ox was 74 and did come up to 95 after O2. cardiac exam does show slight sinus tachycardia.  Lungs show rales bilaterally over the lower bases. Bilateral 2+ pedal edema is noted. EKG read by me shows a sinus rhythm with left anterior fascicular block, QRS pattern of old septal infarct unchanged from previous reading.      Assessment & Plan:  Acute congestive heart failure, unspecified heart failure type (HCC)  Hypoxia EMS called for transfer to hospital for further evaluation and treatment of her CHF.

## 2020-05-26 ENCOUNTER — Inpatient Hospital Stay (HOSPITAL_COMMUNITY): Payer: Medicare Other

## 2020-05-26 DIAGNOSIS — I361 Nonrheumatic tricuspid (valve) insufficiency: Secondary | ICD-10-CM

## 2020-05-26 DIAGNOSIS — I342 Nonrheumatic mitral (valve) stenosis: Secondary | ICD-10-CM

## 2020-05-26 DIAGNOSIS — I2699 Other pulmonary embolism without acute cor pulmonale: Secondary | ICD-10-CM

## 2020-05-26 DIAGNOSIS — R0602 Shortness of breath: Secondary | ICD-10-CM | POA: Diagnosis not present

## 2020-05-26 DIAGNOSIS — F0390 Unspecified dementia without behavioral disturbance: Secondary | ICD-10-CM

## 2020-05-26 DIAGNOSIS — E785 Hyperlipidemia, unspecified: Secondary | ICD-10-CM

## 2020-05-26 DIAGNOSIS — J9601 Acute respiratory failure with hypoxia: Secondary | ICD-10-CM

## 2020-05-26 DIAGNOSIS — E871 Hypo-osmolality and hyponatremia: Secondary | ICD-10-CM

## 2020-05-26 LAB — CBC WITH DIFFERENTIAL/PLATELET
Abs Immature Granulocytes: 0.03 10*3/uL (ref 0.00–0.07)
Basophils Absolute: 0.1 10*3/uL (ref 0.0–0.1)
Basophils Relative: 1 %
Eosinophils Absolute: 0.3 10*3/uL (ref 0.0–0.5)
Eosinophils Relative: 4 %
HCT: 35.7 % — ABNORMAL LOW (ref 36.0–46.0)
Hemoglobin: 12.3 g/dL (ref 12.0–15.0)
Immature Granulocytes: 0 %
Lymphocytes Relative: 19 %
Lymphs Abs: 1.5 10*3/uL (ref 0.7–4.0)
MCH: 30.7 pg (ref 26.0–34.0)
MCHC: 34.5 g/dL (ref 30.0–36.0)
MCV: 89 fL (ref 80.0–100.0)
Monocytes Absolute: 0.7 10*3/uL (ref 0.1–1.0)
Monocytes Relative: 9 %
Neutro Abs: 5.2 10*3/uL (ref 1.7–7.7)
Neutrophils Relative %: 67 %
Platelets: 336 10*3/uL (ref 150–400)
RBC: 4.01 MIL/uL (ref 3.87–5.11)
RDW: 14.1 % (ref 11.5–15.5)
WBC: 7.8 10*3/uL (ref 4.0–10.5)
nRBC: 0 % (ref 0.0–0.2)

## 2020-05-26 LAB — CBC
HCT: 35.2 % — ABNORMAL LOW (ref 36.0–46.0)
Hemoglobin: 12.2 g/dL (ref 12.0–15.0)
MCH: 30.5 pg (ref 26.0–34.0)
MCHC: 34.7 g/dL (ref 30.0–36.0)
MCV: 88 fL (ref 80.0–100.0)
Platelets: 288 10*3/uL (ref 150–400)
RBC: 4 MIL/uL (ref 3.87–5.11)
RDW: 14.2 % (ref 11.5–15.5)
WBC: 9.3 10*3/uL (ref 4.0–10.5)
nRBC: 0 % (ref 0.0–0.2)

## 2020-05-26 LAB — ECHOCARDIOGRAM COMPLETE
AR max vel: 1.77 cm2
AV Area VTI: 1.55 cm2
AV Area mean vel: 1.89 cm2
AV Mean grad: 9 mmHg
AV Peak grad: 18.1 mmHg
Ao pk vel: 2.13 m/s
Area-P 1/2: 1.89 cm2
Height: 65 in
MV VTI: 1.51 cm2
S' Lateral: 2.5 cm
Weight: 2088.2 oz

## 2020-05-26 LAB — COMPREHENSIVE METABOLIC PANEL
ALT: 10 U/L (ref 0–44)
AST: 13 U/L — ABNORMAL LOW (ref 15–41)
Albumin: 3.3 g/dL — ABNORMAL LOW (ref 3.5–5.0)
Alkaline Phosphatase: 63 U/L (ref 38–126)
Anion gap: 11 (ref 5–15)
BUN: 13 mg/dL (ref 8–23)
CO2: 24 mmol/L (ref 22–32)
Calcium: 8.8 mg/dL — ABNORMAL LOW (ref 8.9–10.3)
Chloride: 97 mmol/L — ABNORMAL LOW (ref 98–111)
Creatinine, Ser: 0.84 mg/dL (ref 0.44–1.00)
GFR, Estimated: >60 mL/min (ref >60)
Glucose, Bld: 145 mg/dL — ABNORMAL HIGH (ref 70–99)
Potassium: 3.6 mmol/L (ref 3.5–5.1)
Sodium: 132 mmol/L — ABNORMAL LOW (ref 135–145)
Total Bilirubin: 0.5 mg/dL (ref 0.3–1.2)
Total Protein: 6.2 g/dL — ABNORMAL LOW (ref 6.5–8.1)

## 2020-05-26 LAB — GLUCOSE, CAPILLARY
Glucose-Capillary: 150 mg/dL — ABNORMAL HIGH (ref 70–99)
Glucose-Capillary: 193 mg/dL — ABNORMAL HIGH (ref 70–99)
Glucose-Capillary: 166 mg/dL — ABNORMAL HIGH (ref 70–99)
Glucose-Capillary: 251 mg/dL — ABNORMAL HIGH (ref 70–99)

## 2020-05-26 LAB — HEPARIN LEVEL (UNFRACTIONATED)
Heparin Unfractionated: 0.38 IU/mL (ref 0.30–0.70)
Heparin Unfractionated: <0.1 IU/mL — ABNORMAL LOW (ref 0.30–0.70)
Heparin Unfractionated: 0.22 IU/mL — ABNORMAL LOW (ref 0.30–0.70)

## 2020-05-26 LAB — HEMOGLOBIN A1C
Hgb A1c MFr Bld: 8 % — ABNORMAL HIGH (ref 4.8–5.6)
Mean Plasma Glucose: 182.9 mg/dL

## 2020-05-26 LAB — PROCALCITONIN: Procalcitonin: <0.1 ng/mL

## 2020-05-26 MED ORDER — HALOPERIDOL LACTATE 5 MG/ML IJ SOLN
1.0000 mg | Freq: Once | INTRAMUSCULAR | Status: AC
  Administered 2020-05-26: 1 mg via INTRAVENOUS
  Filled 2020-05-26: qty 1

## 2020-05-26 MED ORDER — ROSUVASTATIN CALCIUM 20 MG PO TABS
20.0000 mg | ORAL_TABLET | Freq: Every day | ORAL | Status: DC
  Administered 2020-05-26 – 2020-05-27 (×2): 20 mg via ORAL
  Filled 2020-05-26 (×2): qty 1

## 2020-05-26 MED ORDER — HEPARIN BOLUS VIA INFUSION
1000.0000 [IU] | Freq: Once | INTRAVENOUS | Status: AC
  Administered 2020-05-26: 1000 [IU] via INTRAVENOUS
  Filled 2020-05-26: qty 1000

## 2020-05-26 MED ORDER — FUROSEMIDE 10 MG/ML IJ SOLN
40.0000 mg | Freq: Two times a day (BID) | INTRAMUSCULAR | Status: DC
  Administered 2020-05-26 – 2020-05-28 (×5): 40 mg via INTRAVENOUS
  Filled 2020-05-26 (×5): qty 4

## 2020-05-26 MED ORDER — ACETAMINOPHEN 650 MG RE SUPP: 650.0000 mg | Freq: Four times a day (QID) | RECTAL | Status: DC | PRN

## 2020-05-26 MED ORDER — ACETAMINOPHEN 325 MG PO TABS: 650.0000 mg | ORAL_TABLET | Freq: Four times a day (QID) | ORAL | Status: DC | PRN

## 2020-05-26 MED ORDER — FUROSEMIDE 10 MG/ML IJ SOLN
20.0000 mg | Freq: Two times a day (BID) | INTRAMUSCULAR | Status: DC
  Filled 2020-05-26: qty 2

## 2020-05-26 MED ORDER — INSULIN ASPART 100 UNIT/ML ~~LOC~~ SOLN
0.0000 [IU] | Freq: Every day | SUBCUTANEOUS | Status: DC
  Administered 2020-05-26 – 2020-05-27 (×2): 3 [IU] via SUBCUTANEOUS

## 2020-05-26 MED ORDER — AMLODIPINE BESYLATE 10 MG PO TABS
10.0000 mg | ORAL_TABLET | Freq: Every day | ORAL | Status: DC
  Administered 2020-05-26 – 2020-05-29 (×4): 10 mg via ORAL
  Filled 2020-05-26 (×4): qty 1

## 2020-05-26 MED ORDER — INSULIN ASPART 100 UNIT/ML ~~LOC~~ SOLN
0.0000 [IU] | Freq: Three times a day (TID) | SUBCUTANEOUS | Status: DC
  Administered 2020-05-26 (×3): 2 [IU] via SUBCUTANEOUS
  Administered 2020-05-27: 1 [IU] via SUBCUTANEOUS
  Administered 2020-05-27: 3 [IU] via SUBCUTANEOUS
  Administered 2020-05-27 – 2020-05-28 (×2): 2 [IU] via SUBCUTANEOUS
  Administered 2020-05-28: 1 [IU] via SUBCUTANEOUS

## 2020-05-26 NOTE — Progress Notes (Signed)
ANTICOAGULATION CONSULT NOTE - Follow Up Consult  Pharmacy Consult for heparin Indication: pulmonary embolus  Labs: Recent Labs    05/25/20 1508 05/25/20 1847 05/26/20 0319 05/26/20 0658 05/26/20 0833 05/26/20 1510 05/26/20 2259  HGB 12.3  --  12.2  --  12.3  --   --   HCT 35.9*  --  35.2*  --  35.7*  --   --   PLT 303  --  288  --  336  --   --   HEPARINUNFRC  --   --   --  0.38  --  <0.10* 0.22*  CREATININE 0.91  --   --   --  0.84  --   --   TROPONINIHS 11 12  --   --   --   --   --     Assessment: 87yo female subtherapeutic on heparin after resumed; no gtt issues or signs of bleeding per RN.  Goal of Therapy:  Heparin level 0.3-0.7 units/ml   Plan:  Will give small heparin bolus of 1000 units and increase heparin gtt by 2-3 units/kg/hr to 1250 units/hr and check level in 8 hours.    Vernard Gambles, PharmD, BCPS  05/26/2020,11:40 PM

## 2020-05-26 NOTE — Progress Notes (Signed)
ANTICOAGULATION CONSULT NOTE - Follow Up Consult  Pharmacy Consult for heparin Indication: pulmonary embolus  Allergies  Allergen Reactions  . Penicillins Swelling  . Sulfa Antibiotics Swelling    Weak   . Sulfamethoxazole Other (See Comments)    Feels like her legs are weighted down    Patient Measurements: Height: _0  (165.1 cm) Weight: 59.2 kg (130 lb 8.2 oz) IBW/kg (Calculated) : 57  Vital Signs: Temp: 98 F (36.7 C) (03/19 1245) Temp Source: Oral (03/19 1245) BP: 115/49 (03/19 1245) Pulse Rate: 83 (03/19 1245)  Labs: Recent Labs    05/25/20 1508 05/25/20 1847 05/26/20 0319 05/26/20 0658 05/26/20 0833 05/26/20 1510  HGB 12.3  --  12.2  --  12.3  --   HCT 35.9*  --  35.2*  --  35.7*  --   PLT 303  --  288  --  336  --   HEPARINUNFRC  --   --   --  0.38  --  <0.10*  CREATININE 0.91  --   --   --  0.84  --   TROPONINIHS 11 12  --   --   --   --     Estimated Creatinine Clearance: 43.3 mL/min (by C-G formula based on SCr of 0.84 mg/dL).   Medical History: Past Medical History:  Diagnosis Date  . Arthritis   . Cataract   . Cystocele   . Dementia ([place])    forgetfulness  . Diabetes mellitus   . Dyslipidemia   . History of colonic polyps   . Hx MRSA infection   . Hyperlipidemia   . Hypertension   . Mental disorder   . Neuropathic pain of lower extremity   . OAB (overactive bladder)   . Shortness of breath    with exertion    Medications:  Medications Prior to Admission  Medication Sig Dispense Refill Last Dose  . acetaminophen (TYLENOL) 325 MG tablet Take 2 tablets (650 mg total) by mouth every 6 (six) hours as needed for mild pain (or Fever >/= 101).   unk  . albuterol (VENTOLIN HFA) 108 (90 Base) MCG/ACT inhaler Inhale 2 puffs into the lungs every 6 (six) hours as needed for wheezing or shortness of breath.   unk  . amLODipine (NORVASC) 10 MG tablet TAKE 1 TABLET(10 MG) BY MOUTH DAILY (Patient taking differently: Take 10 mg by mouth daily.) 90  tablet 3 05/25/2020 at Unknown time  . lisinopril-hydrochlorothiazide (ZESTORETIC) 20-12.5 MG tablet Take 1 tablet by mouth daily. 90 tablet 3 05/25/2020 at Unknown time  . ONETOUCH ULTRA test strip USE AS DIRECTED 100 strip 3   . pioglitazone-metformin (ACTOPLUS MET) 15-850 MG tablet TAKE 1 TABLET BY MOUTH TWICE DAILY WITH A MEAL (Patient taking differently: Take 1 tablet by mouth daily.) 180 tablet 1 05/25/2020 at Unknown time  . polyethylene glycol (MIRALAX / GLYCOLAX) 17 g packet Take 17 g by mouth daily as needed for mild constipation. 14 each 0 unk  . rosuvastatin (CRESTOR) 20 MG tablet TAKE 1 TABLET(20 MG) BY MOUTH DAILY (Patient taking differently: Take 20 mg by mouth daily.) 90 tablet 3 05/25/2020 at Unknown time    Assessment: 13 YOF with difficulty breathing diagnosed with chronic L lower lobe PE. Pharmacy consulted to start IV heparin.   Heparin level this afternoon is undetectable. Per RN, patient pulled out her IV line twice and heparin was stopped both times. This level is likely not reflective of true heparin concentration. No s/s of overt  bleeding noted   Goal of Therapy:  Heparin level 0.3-0.7 units/ml Monitor platelets by anticoagulation protocol: Yes   Plan:  -Continue heparin drip at 1100 units/hr.  -Repeat 8 hr HL -Monitor daily HL, CBC and s/s of bleeding   Albertina Parr, PharmD., BCPS, BCCCP Clinical Pharmacist Please refer to [facility] for unit-specific pharmacist

## 2020-05-26 NOTE — Progress Notes (Signed)
Agree with excellent hpiDr. Loney Loh  Further 87 year old community dwelling white female (lives with daughter) HTN DM TY 2 Hyperplasctic colon polyp 2004  COVID -19 +Compression fracture 02/27/2020 + kyphoplasty-DC to SNF--a Completing 5 days of remdesivir at that time at that admission felt to have?  ILD---was to follow-up with pulmonology to repeat HRCT  Seen at PCP S OB X 48 hours-O2 sat 78%-Rx O2 2 L-->98% ?  LE edema X6 MO-increasing cognitive deficits since COVID diagnosis CT chest = indeterminate age PE + - multifocal PNA  Felt to also have right-sided heart failure symptoms likely secondary to PE-diuresing-echocardiogram pending-  Data Hb 12.2, wbc 9 A1c 8 Na 130 Bun/cr 16/0.9 Bnp 332  Problem list Indeterminant pulmonary embolism + right-sided heart failure symptoms Covid ILD since December 2021 + cognitive deficits Advanced age-very old old DM TY 2 Prior kyphoplasty 2021 HTN  P ECHo re-ordered as stat--to define RH physiology Diurese prn Abs am ong discussion with d-I-l and with daughter SOn is HCPOA--lives away-- Patient confirmed as DNR however  desat to 45 off oxygen--will need lifelong? OP Pulm follow-up? Give lasix Expect need 48 hrs to d/c  And 3-6 MO DOAC--will ask TOC for benefits check for Xarelto  Pleas Koch, MD [facility] 11:45 AM

## 2020-05-26 NOTE — Hospital Course (Addendum)
IMPRESSIONS     1. Left ventricular ejection fraction, by estimation, is 65 to 70%. The  left ventricle has normal function. The left ventricle has no regional  wall motion abnormalities. There is moderate concentric left ventricular  hypertrophy. Left ventricular  diastolic parameters are consistent with Grade I diastolic dysfunction  (impaired relaxation). Elevated left ventricular end-diastolic pressure.  There is the interventricular septum is flattened in systole, consistent  with right ventricular pressure  overload.   2. Right ventricular systolic function is moderately reduced. The right  ventricular size is normal. There is severely elevated pulmonary artery  systolic pressure. The estimated right ventricular systolic pressure is  83.0 mmHg.   3. The mitral valve is degenerative. No evidence of mitral valve  regurgitation. Mild to moderate mitral stenosis. The mean mitral valve  gradient is 5.0 mmHg at 87bpm.   4. The aortic valve is normal in structure. Aortic valve regurgitation is  not visualized. No aortic stenosis is present.   5. The inferior vena cava is normal in size with <50% respiratory  variability, suggesting right atrial pressure of 8 mmHg.   6. Tricuspid valve regurgitation is mild to moderate.

## 2020-05-26 NOTE — Progress Notes (Signed)
ANTICOAGULATION CONSULT NOTE - Initial Consult  Pharmacy Consult for heparin Indication: pulmonary embolus  Allergies  Allergen Reactions  . Penicillins Swelling  . Sulfa Antibiotics Swelling    Weak   . Sulfamethoxazole Other (See Comments)    Feels like her legs are weighted down    Patient Measurements: Height: '5\' 5"'  (165.1 cm) Weight: 59.2 kg (130 lb 8.2 oz) IBW/kg (Calculated) : 57  Vital Signs: Temp: 97.8 F (36.6 C) (03/19 0512) Temp Source: Axillary (03/19 0512) BP: 137/54 (03/19 0512) Pulse Rate: 69 (03/19 0512)  Labs: Recent Labs    05/25/20 1508 05/25/20 1847 05/26/20 0319  HGB 12.3  --  12.2  HCT 35.9*  --  35.2*  PLT 303  --  288  CREATININE 0.91  --   --   TROPONINIHS 11 12  --     Estimated Creatinine Clearance: 39.9 mL/min (by C-G formula based on SCr of 0.91 mg/dL).   Medical History: Past Medical History:  Diagnosis Date  . Arthritis   . Cataract   . Cystocele   . Dementia ([place])    forgetfulness  . Diabetes mellitus   . Dyslipidemia   . History of colonic polyps   . Hx MRSA infection   . Hyperlipidemia   . Hypertension   . Mental disorder   . Neuropathic pain of lower extremity   . OAB (overactive bladder)   . Shortness of breath    with exertion    Medications:  Medications Prior to Admission  Medication Sig Dispense Refill Last Dose  . acetaminophen (TYLENOL) 325 MG tablet Take 2 tablets (650 mg total) by mouth every 6 (six) hours as needed for mild pain (or Fever >/= 101).   unk  . albuterol (VENTOLIN HFA) 108 (90 Base) MCG/ACT inhaler Inhale 2 puffs into the lungs every 6 (six) hours as needed for wheezing or shortness of breath.   unk  . amLODipine (NORVASC) 10 MG tablet TAKE 1 TABLET(10 MG) BY MOUTH DAILY (Patient taking differently: Take 10 mg by mouth daily.) 90 tablet 3 05/25/2020 at Unknown time  . lisinopril-hydrochlorothiazide (ZESTORETIC) 20-12.5 MG tablet Take 1 tablet by mouth daily. 90 tablet 3 05/25/2020 at Unknown  time  . ONETOUCH ULTRA test strip USE AS DIRECTED 100 strip 3   . pioglitazone-metformin (ACTOPLUS MET) 15-850 MG tablet TAKE 1 TABLET BY MOUTH TWICE DAILY WITH A MEAL (Patient taking differently: Take 1 tablet by mouth daily.) 180 tablet 1 05/25/2020 at Unknown time  . polyethylene glycol (MIRALAX / GLYCOLAX) 17 g packet Take 17 g by mouth daily as needed for mild constipation. 14 each 0 unk  . rosuvastatin (CRESTOR) 20 MG tablet TAKE 1 TABLET(20 MG) BY MOUTH DAILY (Patient taking differently: Take 20 mg by mouth daily.) 90 tablet 3 05/25/2020 at Unknown time    Assessment: 45 YOF with difficulty breathing diagnosed with chronic L lower lobe PE. Pharmacy consulted to start IV heparin.   Heparin level is therapeutic (0.38) on 1100 units/hr. No bleeding or line issues noted. CBC wnl.  Goal of Therapy:  Heparin level 0.3-0.7 units/ml Monitor platelets by anticoagulation protocol: Yes   Plan:  -Heparin gtt at 1100 units/hr. -Obtain confirmatory 8 hr HL -Monitor daily HL, CBC and s/s of bleeding   Claudina Lick, PharmD PGY1 Acute Care Pharmacy Resident 05/26/2020 7:39 AM  Please check [email or web address] for unit-specific pharmacy phone numbers.

## 2020-05-26 NOTE — H&P (Signed)
History and Physical    Kristin Cook ZOX:[id number] DOB: January 16, 1932 DOA: 05/25/2020  PCP: [name], MD Patient coming from: PCPs office  Chief Complaint: Shortness of breath  HPI: Kristin Cook is a 87 y.o. female with medical history significant of dementia, type II diabetes, hypertension, hyperlipidemia, ILD, pulmonary hypertension, Covid infection in December 2021 presenting to the ED via EMS from her PCPs office for evaluation of dyspnea on exertion for the past few days and bilateral lower extremity edema for several months.  At PCPs office her sats were 78% on room air and EMS was called.  On EMS arrival, she was satting 91% on room air and had rales on exam. Patient is not sure why she is here.  She is oriented to self only and has no complaints.  ED Course: 86% on room air, sats improved with 2 L supplemental oxygen.  Afebrile.  Not tachycardic.  Labs showing WBC 6.8, hemoglobin 12.3, platelet count 303K.  Sodium 130, potassium 4.6, chloride 97, bicarb 24, BUN 16, creatinine 0.9, glucose 206.  BNP 332.  High-sensitivity troponin negative x2.  SARS-CoV-2 PCR test negative.    Chest x-ray showing chronic interstitial lung disease and no active cardiopulmonary disease.  CT angiogram chest showing small bleb in the left lower pulmonary artery suspicious for remote/chronic PE. Bilateral interstitial accentuation with groundglass densities.  ED physician discussed the case with Dr. Lamonte Sakai from PCCM who reviewed the patient's CT angiogram and felt that this was acute PE given acute onset of dyspnea.  Patient was given IV Lasix 20 mg and started on IV heparin.  Review of Systems:  All systems reviewed and apart from history of presenting illness, are negative.  Past Medical History:  Diagnosis Date  . Arthritis   . Cataract   . Cystocele   . Dementia ([place])    forgetfulness  . Diabetes mellitus   . Dyslipidemia   . History of colonic polyps   . Hx MRSA infection   .  Hyperlipidemia   . Hypertension   . Mental disorder   . Neuropathic pain of lower extremity   . OAB (overactive bladder)   . Shortness of breath    with exertion    Past Surgical History:  Procedure Laterality Date  . ABDOMINAL HYSTERECTOMY    . EYE SURGERY Bilateral    Cataract  . IR KYPHO LUMBAR INC FX REDUCE BONE BX UNI/BIL CANNULATION INC/IMAGING  02/14/2020  . PARS PLANA VITRECTOMY Left 03/15/2013   Procedure: PARS PLANA VITRECTOMY WITH 25 GAUGE LEFT EYE with Endolaser;  Surgeon: Hurman Horn, MD;  Location: [facility];  Service: Ophthalmology;  Laterality: Left;  Marland Kitchen VAGINAL PROLAPSE REPAIR  2007   Dr. Gaetano Net     reports that she quit smoking about 3 months ago. Her smoking use included cigarettes. She has a 18.00 pack-year smoking history. She has never used smokeless tobacco. She reports that she does not drink alcohol and does not use drugs.  Allergies  Allergen Reactions  . Penicillins Swelling  . Sulfa Antibiotics Swelling    Weak   . Sulfamethoxazole Other (See Comments)    Feels like her legs are weighted down    Family History  Problem Relation Age of Onset  . Healthy Mother   . Diabetes Father   . Hypertension Neg Hx     Prior to Admission medications   Medication Sig Start Date End Date Taking? Authorizing Provider  acetaminophen (TYLENOL) 325 MG tablet Take 2 tablets (  650 mg total) by mouth every 6 (six) hours as needed for mild pain (or Fever >/= 101). 02/17/20  Yes Ghimire, Henreitta Leber, MD  albuterol (VENTOLIN HFA) 108 (90 Base) MCG/ACT inhaler Inhale 2 puffs into the lungs every 6 (six) hours as needed for wheezing or shortness of breath. 02/17/20  Yes Ghimire, Henreitta Leber, MD  amLODipine (NORVASC) 10 MG tablet TAKE 1 TABLET(10 MG) BY MOUTH DAILY Patient taking differently: Take 10 mg by mouth daily. 09/13/19  Yes [name], MD  lisinopril-hydrochlorothiazide (ZESTORETIC) 20-12.5 MG tablet Take 1 tablet by mouth daily. 09/13/19  Yes [name], MD   [facility] ULTRA test strip USE AS DIRECTED 06/27/19  Yes [name], MD  pioglitazone-metformin (ACTOPLUS MET) [phone] MG tablet TAKE 1 TABLET BY MOUTH TWICE DAILY WITH A MEAL Patient taking differently: Take 1 tablet by mouth daily. 11/21/19  Yes [name], MD  polyethylene glycol (MIRALAX / GLYCOLAX) 17 g packet Take 17 g by mouth daily as needed for mild constipation. 02/17/20  Yes Ghimire, Henreitta Leber, MD  rosuvastatin (CRESTOR) 20 MG tablet TAKE 1 TABLET(20 MG) BY MOUTH DAILY Patient taking differently: Take 20 mg by mouth daily. 09/13/19  Yes [name], MD    Physical Exam: Vitals:   05/25/20 2245 05/25/20 2255 05/25/20 2321 05/26/20 0512  BP: (!) 152/135  (!) 155/74 (!) 137/54  Pulse: 79  78 69  Resp: '20  19 20  ' Temp:  97.7 F (36.5 C) 97.7 F (36.5 C) 97.8 F (36.6 C)  TempSrc:  Oral Oral Axillary  SpO2: 98%  99% 95%  Weight:   59.2 kg   Height:   '5\' 5"'  (1.651 m)     Physical Exam Constitutional:      General: She is not in acute distress. HENT:     Head: Normocephalic and atraumatic.  Eyes:     Extraocular Movements: Extraocular movements intact.     Conjunctiva/sclera: Conjunctivae normal.  Cardiovascular:     Rate and Rhythm: Normal rate and regular rhythm.     Pulses: Normal pulses.  Pulmonary:     Effort: Pulmonary effort is normal.     Breath sounds: Rales present. No wheezing.  Abdominal:     General: Bowel sounds are normal. There is no distension.     Palpations: Abdomen is soft.     Tenderness: There is no abdominal tenderness.  Musculoskeletal:     Cervical back: Normal range of motion and neck supple.     Right lower leg: Edema present.     Left lower leg: Edema present.     Comments: +2 pitting edema of bilateral lower extremities  Skin:    General: Skin is warm and dry.  Neurological:     General: No focal deficit present.     Mental Status: She is alert.     Comments: Oriented to self only     Labs on Admission: I have  personally reviewed following labs and imaging studies  CBC: Recent Labs  Lab 05/25/20 1508 05/26/20 0319  WBC 6.8 9.3  NEUTROABS 5.0  --   HGB 12.3 12.2  HCT 35.9* 35.2*  MCV 89.3 88.0  PLT 303 790   Basic Metabolic Panel: Recent Labs  Lab 05/25/20 1508  NA 130*  K 4.6  CL 97*  CO2 24  GLUCOSE 206*  BUN 16  CREATININE 0.91  CALCIUM 9.0   GFR: Estimated Creatinine Clearance: 39.9 mL/min (by C-G formula based on SCr of  0.91 mg/dL). Liver Function Tests: Recent Labs  Lab 05/25/20 1508  AST 13*  ALT 11  ALKPHOS 66  BILITOT 0.7  PROT 6.3*  ALBUMIN 3.3*   No results for input(s): LIPASE, AMYLASE in the last 168 hours. No results for input(s): AMMONIA in the last 168 hours. Coagulation Profile: No results for input(s): INR, PROTIME in the last 168 hours. Cardiac Enzymes: No results for input(s): CKTOTAL, CKMB, CKMBINDEX, TROPONINI in the last 168 hours. BNP (last 3 results) No results for input(s): PROBNP in the last 8760 hours. HbA1C: Recent Labs    05/26/20 0658  HGBA1C 8.0*   CBG: Recent Labs  Lab 05/26/20 0616  GLUCAP 150*   Lipid Profile: No results for input(s): CHOL, HDL, LDLCALC, TRIG, CHOLHDL, LDLDIRECT in the last 72 hours. Thyroid Function Tests: No results for input(s): TSH, T4TOTAL, FREET4, T3FREE, THYROIDAB in the last 72 hours. Anemia Panel: No results for input(s): VITAMINB12, FOLATE, FERRITIN, TIBC, IRON, RETICCTPCT in the last 72 hours. Urine analysis:    Component Value Date/Time   COLORURINE STRAW (A) 02/12/2020 1405   APPEARANCEUR CLEAR 02/12/2020 1405   LABSPEC 1.003 (L) 02/12/2020 1405   PHURINE 6.0 02/12/2020 1405   GLUCOSEU >=500 (A) 02/12/2020 1405   HGBUR NEGATIVE 02/12/2020 1405   BILIRUBINUR NEGATIVE 02/12/2020 1405   BILIRUBINUR n 08/05/2012 0838   KETONESUR 5 (A) 02/12/2020 1405   PROTEINUR 30 (A) 02/12/2020 1405   UROBILINOGEN negative 08/05/2012 0838   NITRITE NEGATIVE 02/12/2020 1405   LEUKOCYTESUR NEGATIVE  02/12/2020 1405    Radiological Exams on Admission: CT Angio Chest PE W and/or Wo Contrast  Result Date: 05/25/2020 CLINICAL DATA:  Shortness of breath for 2 days.  Hypoxia. EXAM: CT ANGIOGRAPHY CHEST WITH CONTRAST TECHNIQUE: Multidetector CT imaging of the chest was performed using the standard protocol during bolus administration of intravenous contrast. Multiplanar CT image reconstructions and MIPs were obtained to evaluate the vascular anatomy. CONTRAST:  41m OMNIPAQUE IOHEXOL 350 MG/ML SOLN COMPARISON:  Multiple exams, including 04/27/2020 FINDINGS: Cardiovascular: There is a small web in the left lower lobe pulmonary artery on images 141 through 157 of series 7 indicating remote/chronic pulmonary embolus, but no acute pulmonary embolus is demonstrated. Mild to moderate cardiomegaly. Mitral valve calcification. Coronary, aortic arch, and branch vessel atherosclerotic vascular disease. Mediastinum/Nodes: Subcarinal node 1.4 cm in short axis on image 49 series 6, previously 1.6 cm. Paratracheal node 0.9 cm in short axis on image 37 series 6, previously 0.8 cm. Prevascular node 0.9 cm in short axis on image 38 series 6, previously 0.8 cm. Lungs/Pleura: Bilateral interstitial accentuation with patchy ground-glass opacities. Although much of this background appearance is likely attributable to UIP, the ground-glass densities are increased from prior and fairly widespread, raising the possibility of a component of pulmonary edema, atypical pneumonia, acute hypersensitivity reaction, or acute eosinophilic pneumonia. Previous subpleural nodular area of concern in the left upper lobe is unchanged on image 46 of series 6, although somewhat obscured by surrounding additional opacities. Upper Abdomen: Unremarkable Musculoskeletal: Lower thoracic vertebral augmentation. Review of the MIP images confirms the above findings. IMPRESSION: 1. Small web in the left lower lobe pulmonary artery indicating remote/chronic  pulmonary embolus, but no acute pulmonary embolus is demonstrated. 2. Bilateral interstitial accentuation with patchy ground-glass opacities. Although much of this background appearance is likely attributable to UIP, the ground-glass densities are increased from prior and fairly widespread, raising the possibility of a component of pulmonary edema, atypical pneumonia, acute hypersensitivity reaction, or acute eosinophilic pneumonia. 3.  Mild to moderate cardiomegaly. Mitral valve calcification. 4. Coronary, aortic arch, and branch vessel atherosclerotic vascular disease. 5. The potential 8 mm nodule peripherally in the left upper lobe is unchanged over the past month. Original recommendation for 3 month follow up remains in effect. Aortic Atherosclerosis (ICD10-I70.0). Electronically Signed   By: Van Clines M.D.   On: 05/25/2020 20:57   DG Chest Portable 1 View  Result Date: 05/25/2020 CLINICAL DATA:  Shortness of breath for the past 2 days. EXAM: PORTABLE CHEST 1 VIEW COMPARISON:  CT chest dated April 27, 2020. FINDINGS: Unchanged borderline cardiomegaly. Similar appearing diffuse interstitial thickening. No focal consolidation, pleural effusion, or pneumothorax. No acute osseous abnormality. Prior T12 cement augmentation. IMPRESSION: 1.  No active cardiopulmonary disease. 2. Unchanged chronic interstitial lung disease. Electronically Signed   By: Titus Dubin M.D.   On: 05/25/2020 16:36    EKG: Independently reviewed.  Sinus rhythm.  LAFB, incomplete RBBB.  No acute changes.  Assessment/Plan Principal Problem:   Acute hypoxemic respiratory failure (HCC) Active Problems:   HTN (hypertension)   Hyponatremia   Dementia (HCC)   HLD (hyperlipidemia)   Acute hypoxemic respiratory failure: Likely multifactorial from suspected new onset heart failure/pulmonary edema and acute PE.  Patient here for evaluation of dyspnea on exertion for the past few days and bilateral lower extremity edema for  several months.  Sats were in the 70s at her PCPs office/80s on room air in the ED, currently satting well on 2 L supplemental oxygen.  BNP elevated at 332.  Appears volume overloaded on exam with bilateral lower extremity edema and rales.  Echo done in December 2021 showing EF of 65 to 70%.  CTA showing bilateral interstitial accentuation with groundglass densities which appear worse compared to prior CT from December 2021 which was concerning for ILD. SARS-CoV-2 PCR test negative.  Bacterial pneumonia less likely given no fever or leukocytosis.  CT angiogram also revealed a small bleb in the left lower pulmonary artery which radiologist felt was due to remote/chronic PE.  However, CT angiogram was reviewed by Dr. Lamonte Sakai from pulmonology who felt that this is likely acute PE given acute onset of dyspnea. -Continue diuresis with IV Lasix 20 mg twice a day.  Monitor intake and output, daily weights.  Low-sodium diet with fluid restriction.  Echocardiogram ordered. -Continue IV heparin.  Order bilateral lower extremity Dopplers. -Check procalcitonin level.  RVP ordered.  Mild hyponatremia: Likely due to volume overload. -Continue IV diuresis and monitor sodium level  Dementia: Patient is currently oriented to self only.  Unclear what her baseline mental status is.  No focal neuro deficit and following commands appropriately.  Non-insulin-dependent type 2 diabetes -Check A1c.  Sliding scale insulin sensitive ACHS.   Hypertension: Stable. -Continue amlodipine  Hyperlipidemia -Continue Crestor  ILD: CT with findings concerning for ILD and echo done in December 2021 with right ventricular systolic pressure of 41.3 mmHg.  Followed by Dr. Chase Caller. -Will need pulmonology follow-up.  Pulmonary nodule: CT showing a potential 8 mm nodule peripherally in the left upper lobe is unchanged over the past month.  -Radiologist recommending follow-up CT in 3 months  DVT prophylaxis: Lovenox Code Status: DNR  per documentation from prior hospitalization in 02/2020.   Family Communication: No family available at this time. Disposition Plan: Status is: Inpatient  Remains inpatient appropriate because:Inpatient level of care appropriate due to severity of illness   Dispo: The patient is from: Home  Anticipated d/c is to: Home              Patient currently is not medically stable to d/c.   Difficult to place patient No  Level of care: Level of care: Telemetry Cardiac   The medical decision making on this patient was of high complexity and the patient is at high risk for clinical deterioration, therefore this is a level 3 visit.  Shela Leff MD [facility]  If 7PM-7AM, please contact night-coverage [email or web address]  05/26/2020, 8:09 AM

## 2020-05-26 NOTE — Progress Notes (Signed)
  Echocardiogram 2D Echocardiogram has been performed.  Kristin Cook 05/26/2020, 12:48 PM

## 2020-05-26 NOTE — Progress Notes (Signed)
Spoke with RN concerning this patient's IV start. An IV was placed this morning and the patient has removed the IV. At this time, the request is for the RN to attempt the IV start and if she is not successful to notify the IV team. The patient has mittens that were in the room, but not on the patient. The request would also be to place the mittens so that further IV's will not be removed, by the patient.

## 2020-05-27 ENCOUNTER — Inpatient Hospital Stay (HOSPITAL_COMMUNITY): Payer: Medicare Other

## 2020-05-27 DIAGNOSIS — R609 Edema, unspecified: Secondary | ICD-10-CM

## 2020-05-27 LAB — CBC WITH DIFFERENTIAL/PLATELET
Abs Immature Granulocytes: 0.04 10*3/uL (ref 0.00–0.07)
Basophils Absolute: 0.1 10*3/uL (ref 0.0–0.1)
Basophils Relative: 1 %
Eosinophils Absolute: 0.2 10*3/uL (ref 0.0–0.5)
Eosinophils Relative: 2 %
HCT: 35.1 % — ABNORMAL LOW (ref 36.0–46.0)
Hemoglobin: 11.7 g/dL — ABNORMAL LOW (ref 12.0–15.0)
Immature Granulocytes: 0 %
Lymphocytes Relative: 19 %
Lymphs Abs: 1.7 10*3/uL (ref 0.7–4.0)
MCH: 29.7 pg (ref 26.0–34.0)
MCHC: 33.3 g/dL (ref 30.0–36.0)
MCV: 89.1 fL (ref 80.0–100.0)
Monocytes Absolute: 1.2 10*3/uL — ABNORMAL HIGH (ref 0.1–1.0)
Monocytes Relative: 13 %
Neutro Abs: 5.9 10*3/uL (ref 1.7–7.7)
Neutrophils Relative %: 65 %
Platelets: 300 10*3/uL (ref 150–400)
RBC: 3.94 MIL/uL (ref 3.87–5.11)
RDW: 14 % (ref 11.5–15.5)
WBC: 9 10*3/uL (ref 4.0–10.5)
nRBC: 0 % (ref 0.0–0.2)

## 2020-05-27 LAB — COMPREHENSIVE METABOLIC PANEL
ALT: 8 U/L (ref 0–44)
AST: 14 U/L — ABNORMAL LOW (ref 15–41)
Albumin: 3.1 g/dL — ABNORMAL LOW (ref 3.5–5.0)
Alkaline Phosphatase: 59 U/L (ref 38–126)
Anion gap: 10 (ref 5–15)
BUN: 19 mg/dL (ref 8–23)
CO2: 28 mmol/L (ref 22–32)
Calcium: 8.9 mg/dL (ref 8.9–10.3)
Chloride: 97 mmol/L — ABNORMAL LOW (ref 98–111)
Creatinine, Ser: 0.99 mg/dL (ref 0.44–1.00)
GFR, Estimated: 56 mL/min — ABNORMAL LOW (ref >60)
Glucose, Bld: 101 mg/dL — ABNORMAL HIGH (ref 70–99)
Potassium: 3.4 mmol/L — ABNORMAL LOW (ref 3.5–5.1)
Sodium: 135 mmol/L (ref 135–145)
Total Bilirubin: 0.4 mg/dL (ref 0.3–1.2)
Total Protein: 6.1 g/dL — ABNORMAL LOW (ref 6.5–8.1)

## 2020-05-27 LAB — MAGNESIUM: Magnesium: 1.6 mg/dL — ABNORMAL LOW (ref 1.7–2.4)

## 2020-05-27 LAB — GLUCOSE, CAPILLARY
Glucose-Capillary: 147 mg/dL — ABNORMAL HIGH (ref 70–99)
Glucose-Capillary: 234 mg/dL — ABNORMAL HIGH (ref 70–99)
Glucose-Capillary: 193 mg/dL — ABNORMAL HIGH (ref 70–99)
Glucose-Capillary: 266 mg/dL — ABNORMAL HIGH (ref 70–99)

## 2020-05-27 LAB — HEPARIN LEVEL (UNFRACTIONATED): Heparin Unfractionated: 0.48 IU/mL (ref 0.30–0.70)

## 2020-05-27 MED ORDER — PIOGLITAZONE HCL-METFORMIN HCL 15-850 MG PO TABS: 1.0000 | ORAL_TABLET | Freq: Two times a day (BID) | ORAL | Status: DC

## 2020-05-27 MED ORDER — METFORMIN HCL 850 MG PO TABS
850.0000 mg | ORAL_TABLET | Freq: Every day | ORAL | Status: DC
Start: 2020-05-27 — End: 2020-05-29
  Administered 2020-05-27 – 2020-05-29 (×3): 850 mg via ORAL
  Filled 2020-05-27 (×3): qty 1

## 2020-05-27 MED ORDER — PIOGLITAZONE HCL 15 MG PO TABS: 15.0000 mg | ORAL_TABLET | Freq: Two times a day (BID) | ORAL | Status: DC

## 2020-05-27 MED ORDER — RIVAROXABAN 15 MG PO TABS
15.0000 mg | ORAL_TABLET | Freq: Two times a day (BID) | ORAL | Status: DC
  Administered 2020-05-27 – 2020-05-29 (×5): 15 mg via ORAL
  Filled 2020-05-27 (×5): qty 1

## 2020-05-27 MED ORDER — METFORMIN HCL 850 MG PO TABS: 850.0000 mg | ORAL_TABLET | Freq: Two times a day (BID) | ORAL | Status: DC

## 2020-05-27 MED ORDER — RIVAROXABAN 20 MG PO TABS: 20.0000 mg | ORAL_TABLET | Freq: Every day | ORAL | Status: DC

## 2020-05-27 MED ORDER — POTASSIUM CHLORIDE CRYS ER 20 MEQ PO TBCR
40.0000 meq | EXTENDED_RELEASE_TABLET | Freq: Every day | ORAL | Status: DC
  Administered 2020-05-27 – 2020-05-29 (×3): 40 meq via ORAL
  Filled 2020-05-27 (×3): qty 2

## 2020-05-27 MED ORDER — PIOGLITAZONE HCL 15 MG PO TABS
15.0000 mg | ORAL_TABLET | Freq: Every day | ORAL | Status: DC
  Administered 2020-05-27 – 2020-05-29 (×3): 15 mg via ORAL
  Filled 2020-05-27 (×3): qty 1

## 2020-05-27 NOTE — Progress Notes (Signed)
Bed alarm activated by pt getting OOB without asst, pt easily redirected and returned to bed by nursing.

## 2020-05-27 NOTE — Evaluation (Signed)
Physical Therapy Evaluation Patient Details Name: Kristin Cook MRN: [id number] DOB: Oct 01, 1931 Today's Date: 05/27/2020   History of Present Illness  87 y.o. female with medical history significant of dementia, type II diabetes, hypertension, hyperlipidemia, ILD, pulmonary hypertension, Covid infection and kyphoplasty in December 2021 with discharge to SNF, presenting to the ED 05/25/20 via EMS for evaluation of dyspnea on exertion and bilateral lower extremity edema. CT angiogram read by Dr Delton Coombes as acute PE along with ILD. LE dopplers pending but therapeutic on Heparin  Clinical Impression   Pt admitted with above diagnosis. Patient living with daughter-in-law PTA, using cane for mobility and performing basic ADLs herself (with bathing at sink level). Unclear if daughter-in-law provides 24/7 supervision (pt not reliable, family not present). Currently she desaturated with activity with 2L O2 (initially on 1L at rest with sats 87%; increased to 2L at rest with sats 91%; walking on 2L desaturated to 84%).   Pt currently with functional limitations due to the deficits listed below (see PT Problem List). Pt will benefit from skilled PT to increase their independence and safety with mobility to allow discharge to the venue listed below.  Will benefit from HHPT especially if she is discharged home on home O2 (which she denies having at home currently).      Follow Up Recommendations Home health PT;Supervision/Assistance - 24 hour (if family does not provide 24/7 supervision, may consider SNF)    Equipment Recommendations  None recommended by PT    Recommendations for Other Services OT consult     Precautions / Restrictions Precautions Precautions: Fall Precaution Comments: watch sats      Mobility  Bed Mobility Overal bed mobility: Modified Independent             General bed mobility comments: HOB flat, no rail    Transfers Overall transfer level: Needs assistance Equipment  used: Rolling walker (2 wheeled) Transfers: Sit to/from Stand Sit to Stand: Min assist         General transfer comment: pt pulling on RW and causing it to tip with no awareness; min assist to shift forward over BOS  Ambulation/Gait Ambulation/Gait assistance: Min guard;+2 safety/equipment Gait Distance (Feet): 150 Feet Assistive device: Rolling walker (2 wheeled) Gait Pattern/deviations: Decreased stride length;Trunk flexed Gait velocity: decr   General Gait Details: pt managing RW without assist  Stairs            Wheelchair Mobility    Modified Rankin (Stroke Patients Only)       Balance Overall balance assessment: Mild deficits observed, not formally tested                                           Pertinent Vitals/Pain Pain Assessment: No/denies pain    Home Living Family/patient expects to be discharged to:: Private residence Living Arrangements: Children Available Help at Discharge: Family Type of Home: House       Home Layout: Two level;Able to live on main level with bedroom/bathroom (pt reports used to live in basement area, has moved upstairs with daughter-in-law) Home Equipment: Gilmer Mor - single point;Walker - 2 wheels;Shower seat Additional Comments: poor historian, some information from previous record    Prior Function           Comments: Per previous record: uses cane at all times for mobility; DIL manages IADL. Pt is independent in ADL. Has been doing  wash ups due to poor ability to transfer in and out of tub shower     Hand Dominance        Extremity/Trunk Assessment   Upper Extremity Assessment Upper Extremity Assessment: Generalized weakness    Lower Extremity Assessment Lower Extremity Assessment: Generalized weakness    Cervical / Trunk Assessment Cervical / Trunk Assessment: Kyphotic  Communication   Communication: No difficulties  Cognition Arousal/Alertness: Awake/alert Behavior During Therapy:  Flat affect Overall Cognitive Status: No family/caregiver present to determine baseline cognitive functioning                                 General Comments: repearts wanting to get dressed and asking where her clothes are despite explanation and showing her where her clothes are      General Comments      Exercises     Assessment/Plan    PT Assessment Patient needs continued PT services  PT Problem List Decreased strength;Decreased activity tolerance;Decreased balance;Decreased mobility;Decreased cognition;Decreased knowledge of use of DME;Cardiopulmonary status limiting activity       PT Treatment Interventions DME instruction;Gait training;Functional mobility training;Therapeutic activities;Therapeutic exercise;Balance training;Cognitive remediation;Patient/family education    PT Goals (Current goals can be found in the Care Plan section)  Acute Rehab PT Goals Patient Stated Goal: go home PT Goal Formulation: Patient unable to participate in goal setting Time For Goal Achievement: 06/10/20 Potential to Achieve Goals: Good    Frequency Min 3X/week   Barriers to discharge Other (comment) unknown if has 24/7 supervision    Co-evaluation               AM-PAC PT "6 Clicks" Mobility  Outcome Measure Help needed turning from your back to your side while in a flat bed without using bedrails?: None Help needed moving from lying on your back to sitting on the side of a flat bed without using bedrails?: None Help needed moving to and from a bed to a chair (including a wheelchair)?: A Little Help needed standing up from a chair using your arms (e.g., wheelchair or bedside chair)?: A Little Help needed to walk in hospital room?: A Little Help needed climbing 3-5 steps with a railing? : A Little 6 Click Score: 20    End of Session Equipment Utilized During Treatment: Gait belt;Oxygen Activity Tolerance: Treatment limited secondary to medical complications  (Comment) (desaturates) Patient left: in chair;with call bell/phone within reach;with chair alarm set Nurse Communication: Mobility status PT Visit Diagnosis: Unsteadiness on feet (R26.81);Difficulty in walking, not elsewhere classified (R26.2)    Time: 2229-7989 PT Time Calculation (min) (ACUTE ONLY): 31 min   Charges:   PT Evaluation $PT Eval Moderate Complexity: 1 Mod PT Treatments $Gait Training: 8-22 mins         Jerolyn Center, PT Pager [phone]   Zena Amos 05/27/2020, 10:45 AM

## 2020-05-27 NOTE — Progress Notes (Signed)
Chair alarm activated by pt getting OOB without assistance, pt calm and denies needs, pt returned to chair by nursing. Will continue to redirect pt as needed.

## 2020-05-27 NOTE — Progress Notes (Signed)
VASCULAR LAB    Bilateral lower extremity venous duplex has been performed.  See CV proc for preliminary results.   Alinah Sheard, RVT 05/27/2020, 11:04 AM

## 2020-05-27 NOTE — TOC Initial Note (Signed)
Transition of Care [facility]) - Initial/Assessment Note    Patient Details  Name: Kristin Cook MRN: [id number] Date of Birth: 1932/01/26  [facility] CM/SW Contact:    Oretha Milch, LCSW Phone Number: 05/27/2020, 1:06 PM  Clinical Narrative: CSW met with patient to discuss recommendation of home health evaluation. CSW explained [place] services and options and inquired if she feels it would be beneficial. Patient reported she felt she would be fine but was open to [place] speaking with her daughter-in law about it for further follow-up. CSW left a voicemail requesting a call back from the daughter-in-law. CSW left list of Medicare ratings with patient and TOC team will continue to follow.                   Expected Discharge Plan: [place] Services Barriers to Discharge: Other (comment) (Declining HH referral)   Patient Goals and CMS Choice Patient states their goals for this hospitalization and ongoing recovery are:: "I just want to go back home." CMS Medicare.gov Compare Post Acute Care list provided to:: Patient Choice offered to / list presented to : [place]  Expected Discharge Plan and Services Expected Discharge Plan: [place]       Living arrangements for the past 2 months: Single Family Home                                      Prior Living Arrangements/Services Living arrangements for the past 2 months: Single Family Home Lives with:: Adult Children Patient language and need for interpreter reviewed:: Yes Do you feel safe going back to the place where you live?: Yes      Need for Family Participation in Patient Care: Yes (Comment) Care giver support system in place?: Yes (comment)   Criminal Activity/Legal Involvement Pertinent to Current Situation/Hospitalization: No - Comment as needed  Activities of Daily Living      Permission Sought/Granted Permission sought to share information with : Family  Chief Financial Officer Permission granted to share information with : Yes, Verbal Permission Granted              Emotional Assessment Appearance:: Appears stated age Attitude/Demeanor/Rapport: Charismatic,Engaged Affect (typically observed): Apprehensive Orientation: : Fluctuating Orientation (Suspected and/or reported Sundowners) Alcohol / Substance Use: Not Applicable Psych Involvement: No (comment)  Admission diagnosis:  SOB (shortness of breath) [R06.02] Hypoxia [R09.02] Acute hypoxemic respiratory failure ([place]) [J96.01] Pulmonary embolism, unspecified chronicity, unspecified pulmonary embolism type, unspecified whether acute cor pulmonale present ([place]) [I26.99] Patient Active Problem List   Diagnosis Date Noted  . Hyponatremia 05/26/2020  . Dementia ([place]) 05/26/2020  . HLD (hyperlipidemia) 05/26/2020  . Acute hypoxemic respiratory failure ([place]) 05/25/2020  . Compression fracture of lumbar spine, non-traumatic, initial encounter ([place]) 02/12/2020  . Hypoxia 02/12/2020  . COVID-19 virus infection 02/12/2020  . Mild cognitive impairment with memory loss 09/21/2018  . Onychomycosis of multiple toenails with type 2 diabetes mellitus ([place]) 10/22/2015  . Presbycusis of both ears 06/19/2015  . Severe nonproliferative diabetic retinopathy of left eye with macular edema ([place]) 12/30/2011  . Arthritis 09/29/2011  . Controlled diabetes mellitus type 2 with complications ([place]) 11/94/1740  . HTN (hypertension) 11/05/2010  . Hyperlipidemia associated with type 2 diabetes mellitus ([place]) 11/05/2010   PCP:  [name], MD Pharmacy:   [facility], [place] - [address] AT [facility] [address] [place] [address] Phone: [phone] Fax: [place], [address], [address] [place], [address] [place] [address] Phone: [phone] Fax:  [phone]     Social Determinants of Health (SDOH) Interventions    Readmission Risk Interventions No flowsheet data found.

## 2020-05-27 NOTE — Progress Notes (Signed)
ANTICOAGULATION CONSULT NOTE - Initial Consult  Pharmacy Consult for Xarelto dosing Indication: pulmonary embolus  Allergies  Allergen Reactions  . Penicillins Swelling  . Sulfa Antibiotics Swelling    Weak   . Sulfamethoxazole Other (See Comments)    Feels like her legs are weighted down    Patient Measurements: Height: 5\' 5"  (165.1 cm) Weight: 57.4 kg (126 lb 8.7 oz) IBW/kg (Calculated) : 57  Vital Signs: Temp: 99.3 F (37.4 C) (03/20 0516) Temp Source: Oral (03/20 0516) BP: 136/65 (03/20 0516) Pulse Rate: 96 (03/20 0516)  Labs: Recent Labs    05/25/20 1508 05/25/20 1847 05/26/20 0319 05/26/20 05/28/20 05/26/20 0833 05/26/20 1510 05/26/20 2259 05/27/20 0027  HGB 12.3  --  12.2  --  12.3  --   --  11.7*  HCT 35.9*  --  35.2*  --  35.7*  --   --  35.1*  PLT 303  --  288  --  336  --   --  300  HEPARINUNFRC  --   --   --    < >  --  <0.10* 0.22* 0.48  CREATININE 0.91  --   --   --  0.84  --   --  0.99  TROPONINIHS 11 12  --   --   --   --   --   --    < > = values in this interval not displayed.    Estimated Creatinine Clearance: 36.7 mL/min (by C-G formula based on SCr of 0.99 mg/dL).   Medical History: Past Medical History:  Diagnosis Date  . Arthritis   . Cataract   . Cystocele   . Dementia (HCC)    forgetfulness  . Diabetes mellitus   . Dyslipidemia   . History of colonic polyps   . Hx MRSA infection   . Hyperlipidemia   . Hypertension   . Mental disorder   . Neuropathic pain of lower extremity   . OAB (overactive bladder)   . Shortness of breath    with exertion    Assessment: 87yo female admitted with SOB and found to have PE. Initially on IV heparin, now pharmacy consulted to transition to Xarelto.   Goal of Therapy:  Prevent clots  Monitor platelets by anticoagulation protocol: Yes   Plan:  STOP heparin gtt START rivaroxaban (Xarelto) 15 mg BID x21 days, then 20 mg daily.  Monitor for s/sx of bleeding  87yo, PharmD PGY1  Acute Care Pharmacy Resident 05/27/2020 8:18 AM  Please check [email or web address] for unit-specific pharmacy phone numbers.

## 2020-05-27 NOTE — Progress Notes (Addendum)
PROGRESS NOTE   Kristin Cook  OZY:[id number] DOB: 1932/02/06 DOA: 05/25/2020 PCP: [name], MD  Brief Narrative:  87 year old community dwelling white female (lives with daughter) HTN DM TY 2 Hyperplasctic colon polyp 2004  COVID -19 +Compression fracture 02/27/2020 + kyphoplasty-DC to SNF--a Completing 5 days of remdesivir at that time at that admission felt to have?  ILD---was to follow-up with pulmonology to repeat HRCT  Seen at PCP S OB X 48 hours-O2 sat 78%-Rx O2 2 L-->98% ?  LE edema X6 MO-increasing cognitive deficits since COVID diagnosis CT chest = indeterminate age PE + - multifocal PNA  Felt to also have right-sided heart failure symptoms likely secondary to PE-diuresing-echocardiogram pending-  Data Hb 12.2, wbc 9 A1c 8 Na 130 Bun/cr 16/0.9 Bnp 332  Hospital-Problem based course  Right-sided heart failure >indeterminant pulmonary embolism causing acute hypoxic respiratory failure on admission Diurese with Lasix IV 40 ii/day -500 cc so far weight 57 Expect will need diuresis until becomes azotemic Pulmonary embolism Change heparin to Xarelto (likely 3 months duration) as physiology is not in keeping with decompensation PNA PTA? Repeat CXR in a.m. 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   Difficult to place patient No  Consultants:   Pulmonology curb sided by admitting physician  Procedures: CT chest Duplex lower extremity still not read  Antimicrobials: None currently   Subjective: Quite confused and although able to get up out of bed Nursing reports redirectable however  Objective: Vitals:   05/26/20 1245 05/26/20 1816 05/26/20 2350 05/27/20 0516  BP: (!) 115/49 137/65 (!) 125/53 136/65  Pulse: 83 83 92 96  Resp: '18 16 20 19  ' Temp: 98 F (36.7 C) 98 F (36.7 C) 98.2 F (36.8 C) 99.3 F (37.4 C)  TempSrc: Oral Oral Oral Oral  SpO2:  96% 91% 91%  Weight:    57.4 kg  Height:        Intake/Output Summary (Last 24 hours) at 05/27/2020 1027 Last data filed at 05/27/2020 5038 Gross per 24 hour  Intake 257.93 ml  Output 750 ml  Net -492.07 ml   Filed Weights   05/25/20 1451 05/25/20 2321 05/27/20 0516  Weight: 65.8 kg 59.2 kg 57.4 kg    Examination: Awake-current only x1 EOMI NCAT frail cachectic no JVD no hepatojugular reflex Abdomen soft no rebound Neurologically intact no focal deficit S1-S2 no murmur No lower extremity edema  Data Reviewed: personally reviewed   CBC    Component Value Date/Time   WBC 9.0 05/27/2020 0027   RBC 3.94 05/27/2020 0027   HGB 11.7 (L) 05/27/2020 0027   HGB 14.9 01/18/2020 1005   HCT 35.1 (L)  05/27/2020 0027   HCT 44.4 01/18/2020 1005   PLT 300 05/27/2020 0027   PLT 263 01/18/2020 1005   MCV 89.1 05/27/2020 0027   MCV 88 01/18/2020 1005   MCH 29.7 05/27/2020 0027   MCHC 33.3 05/27/2020 0027   RDW 14.0 05/27/2020 0027   RDW 11.8 01/18/2020 1005   LYMPHSABS 1.7 05/27/2020 0027   LYMPHSABS 0.9 01/18/2020 1005   MONOABS 1.2 (H) 05/27/2020 0027   EOSABS 0.2 05/27/2020 0027   EOSABS 0.1 01/18/2020 1005   BASOSABS 0.1 05/27/2020 0027   BASOSABS 0.1 01/18/2020 1005    CMP Latest Ref Rng & Units 05/27/2020 05/26/2020 05/25/2020  Glucose 70 - 99 mg/dL 101(H) 145(H) 206(H)  BUN 8 - 23 mg/dL '19 13 16  ' Creatinine 0.44 - 1.00 mg/dL 0.99 0.84 0.91  Sodium 135 - 145 mmol/L 135 132(L) 130(L)  Potassium 3.5 - 5.1 mmol/L 3.4(L) 3.6 4.6  Chloride 98 - 111 mmol/L 97(L) 97(L) 97(L)  CO2 22 - 32 mmol/L '28 24 24  ' Calcium 8.9 - 10.3 mg/dL 8.9 8.8(L) 9.0  Total Protein 6.5 - 8.1 g/dL 6.1(L) 6.2(L) 6.3(L)  Total Bilirubin 0.3 - 1.2 mg/dL 0.4 0.5 0.7  Alkaline Phos 38 - 126 U/L 59 63 66  AST 15 - 41 U/L 14(L) 13(L) 13(L)  ALT 0 - 44 U/L '8 10 11     ' Radiology Studies: CT Angio Chest PE W and/or Wo Contrast  Result Date: 05/25/2020 CLINICAL DATA:  Shortness of breath for 2 days.  Hypoxia. EXAM: CT ANGIOGRAPHY CHEST WITH CONTRAST TECHNIQUE: Multidetector CT imaging of the chest was performed using the standard protocol during bolus administration of intravenous contrast. Multiplanar CT image reconstructions and MIPs were obtained to evaluate the vascular anatomy. CONTRAST:  57m OMNIPAQUE IOHEXOL 350 MG/ML SOLN COMPARISON:  Multiple exams, including 04/27/2020 FINDINGS: Cardiovascular: There is a small web in the left lower lobe pulmonary artery on images 141 through 157 of series 7 indicating remote/chronic pulmonary embolus, but no acute pulmonary embolus is demonstrated. Mild to moderate cardiomegaly. Mitral valve calcification. Coronary, aortic arch, and branch vessel atherosclerotic vascular disease. Mediastinum/Nodes: Subcarinal node 1.4 cm in short axis on image 49 series 6, previously 1.6 cm. Paratracheal node 0.9 cm in short axis on image 37 series 6, previously 0.8 cm. Prevascular node 0.9 cm in short axis on image 38 series 6, previously 0.8 cm. Lungs/Pleura: Bilateral interstitial accentuation with patchy ground-glass opacities. Although much of this background appearance is likely attributable to UIP, the ground-glass densities are increased from prior and fairly  widespread, raising the possibility of a component of pulmonary edema, atypical pneumonia, acute hypersensitivity reaction, or acute eosinophilic pneumonia. Previous subpleural nodular area of concern in the left upper lobe is unchanged on image 46 of series 6, although somewhat obscured by surrounding additional opacities. Upper Abdomen: Unremarkable Musculoskeletal: Lower thoracic vertebral augmentation. Review of the MIP images confirms the above findings. IMPRESSION: 1. Small web in the left lower lobe pulmonary artery indicating remote/chronic pulmonary embolus, but no acute pulmonary embolus is demonstrated. 2. Bilateral interstitial accentuation with patchy ground-glass opacities. Although much of this background appearance is likely attributable to UIP, the ground-glass densities are increased from prior and fairly widespread, raising the possibility of a component of pulmonary edema, atypical pneumonia, acute hypersensitivity reaction, or acute eosinophilic pneumonia. 3. Mild to moderate cardiomegaly. Mitral valve calcification. 4. Coronary, aortic arch, and branch vessel atherosclerotic vascular disease. 5. The potential 8 mm nodule peripherally in the left upper lobe is unchanged over  the past month. Original recommendation for 3 month follow up remains in effect. Aortic Atherosclerosis (ICD10-I70.0). Electronically Signed   By: Van Clines M.D.   On: 05/25/2020 20:57   DG Chest Portable 1 View  Result Date: 05/25/2020 CLINICAL DATA:  Shortness of breath for the past 2 days. EXAM: PORTABLE CHEST 1 VIEW COMPARISON:  CT chest dated April 27, 2020. FINDINGS: Unchanged borderline cardiomegaly. Similar appearing diffuse interstitial thickening. No focal consolidation, pleural effusion, or pneumothorax. No acute osseous abnormality. Prior T12 cement augmentation. IMPRESSION: 1.  No active cardiopulmonary disease. 2. Unchanged chronic interstitial lung disease. Electronically Signed   By: Titus Dubin M.D.   On: 05/25/2020 16:36   ECHOCARDIOGRAM COMPLETE  Result Date: 05/26/2020    ECHOCARDIOGRAM REPORT   Patient Name:   Kristin Cook Date of Exam: 05/26/2020 Medical Rec #:  [id number]        Height:       65.0 in Accession #:    [id number]       Weight:       130.5 lb Date of Birth:  02/09/1932        BSA:          1.650 m Patient Age:    78 years         BP:           137/54 mmHg Patient Gender: F                HR:           90 bpm. Exam Location:  Inpatient Procedure: 2D Echo, Cardiac Doppler and Color Doppler STAT ECHO Indications:    Pulmonary embolus  History:        Patient has prior history of Echocardiogram examinations, most                 recent 02/13/2020. Risk Factors:Hypertension, Dyslipidemia,                 Diabetes and Former Smoker.  Sonographer:    Clayton Lefort RDCS (AE) Referring Phys: [phone] [place]  1. Left ventricular ejection fraction, by estimation, is 65 to 70%. The left ventricle has normal function. The left ventricle has no regional wall motion abnormalities. There is moderate concentric left ventricular hypertrophy. Left ventricular diastolic parameters are consistent with Grade I diastolic dysfunction (impaired relaxation). Elevated left ventricular end-diastolic pressure. There is the interventricular septum is flattened in systole, consistent with right ventricular pressure overload.  2. Right ventricular systolic function is moderately reduced. The right ventricular size is normal. There is severely elevated pulmonary artery systolic pressure. The estimated right ventricular systolic pressure is 47.6 mmHg.  3. The mitral valve is degenerative. No evidence of mitral valve regurgitation. Mild to moderate mitral stenosis. The mean mitral valve gradient is 5.0 mmHg at 87bpm.  4. The aortic valve is normal in structure. Aortic valve regurgitation is not visualized. No aortic stenosis is present.  5. The inferior vena cava is normal in size with <50%  respiratory variability, suggesting right atrial pressure of 8 mmHg.  6. Tricuspid valve regurgitation is mild to moderate. FINDINGS  Left Ventricle: Left ventricular ejection fraction, by estimation, is 65 to 70%. The left ventricle has normal function. The left ventricle has no regional wall motion abnormalities. The left ventricular internal cavity size was normal in size. There is  moderate concentric left ventricular hypertrophy. The interventricular septum is flattened in systole, consistent with right ventricular pressure overload. Left  ventricular diastolic parameters are consistent with Grade I diastolic dysfunction (impaired  relaxation). Elevated left ventricular end-diastolic pressure. Right Ventricle: The right ventricular size is normal. No increase in right ventricular wall thickness. Right ventricular systolic function is moderately reduced. There is severely elevated pulmonary artery systolic pressure. The tricuspid regurgitant velocity is 4.33 m/s, and with an assumed right atrial pressure of 8 mmHg, the estimated right ventricular systolic pressure is 24.5 mmHg. Left Atrium: Left atrial size was normal in size. Right Atrium: Right atrial size was normal in size. Pericardium: There is no evidence of pericardial effusion. Mitral Valve: The mitral valve is degenerative in appearance. There is moderate thickening of the mitral valve leaflet(s). There is mild calcification of the mitral valve leaflet(s). Moderate mitral annular calcification. No evidence of mitral valve regurgitation. Moderate mitral valve stenosis. MV peak gradient, 14.1 mmHg. The mean mitral valve gradient is 5.0 mmHg. Tricuspid Valve: The tricuspid valve is normal in structure. Tricuspid valve regurgitation is mild to moderate. No evidence of tricuspid stenosis. Aortic Valve: The aortic valve is normal in structure. Aortic valve regurgitation is not visualized. No aortic stenosis is present. Aortic valve mean gradient measures 9.0  mmHg. Aortic valve peak gradient measures 18.1 mmHg. Aortic valve area, by VTI measures 1.55 cm. Pulmonic Valve: The pulmonic valve was normal in structure. Pulmonic valve regurgitation is trivial. No evidence of pulmonic stenosis. Aorta: The aortic root is normal in size and structure. Venous: The inferior vena cava is normal in size with less than 50% respiratory variability, suggesting right atrial pressure of 8 mmHg. IAS/Shunts: No atrial level shunt detected by color flow Doppler.  LEFT VENTRICLE PLAX 2D LVIDd:         3.20 cm  Diastology LVIDs:         2.50 cm  LV e' medial:    4.79 cm/s LV PW:         1.30 cm  LV E/e' medial:  19.7 LV IVS:        1.30 cm  LV e' lateral:   4.79 cm/s LVOT diam:     1.90 cm  LV E/e' lateral: 19.7 LV SV:         67 LV SV Index:   40 LVOT Area:     2.84 cm  RIGHT VENTRICLE            IVC RV Basal diam:  3.40 cm    IVC diam: 1.60 cm RV Mid diam:    3.50 cm RV S prime:     7.29 cm/s TAPSE (M-mode): 1.3 cm LEFT ATRIUM             Index       RIGHT ATRIUM           Index LA diam:        2.60 cm 1.58 cm/m  RA Area:     12.70 cm LA Vol (A2C):   51.6 ml 31.27 ml/m RA Volume:   29.00 ml  17.58 ml/m LA Vol (A4C):   47.1 ml 28.55 ml/m LA Biplane Vol: 52.8 ml 32.00 ml/m  AORTIC VALVE AV Area (Vmax):    1.77 cm AV Area (Vmean):   1.89 cm AV Area (VTI):     1.55 cm AV Vmax:           212.50 cm/s AV Vmean:          138.000 cm/s AV VTI:            0.430 m  AV Peak Grad:      18.1 mmHg AV Mean Grad:      9.0 mmHg LVOT Vmax:         133.00 cm/s LVOT Vmean:        91.900 cm/s LVOT VTI:          0.235 m LVOT/AV VTI ratio: 0.55  AORTA Ao Root diam: 3.20 cm Ao Asc diam:  3.30 cm MITRAL VALVE                TRICUSPID VALVE MV Area (PHT): 1.89 cm     TR Peak grad:   75.0 mmHg MV Area VTI:   1.51 cm     TR Vmax:        433.00 cm/s MV Peak grad:  14.1 mmHg MV Mean grad:  5.0 mmHg     SHUNTS MV Vmax:       1.88 m/s     Systemic VTI:  0.24 m MV Vmean:      97.5 cm/s    Systemic Diam: 1.90 cm MV  Decel Time: 401 msec MV E velocity: 94.50 cm/s MV A velocity: 184.00 cm/s MV E/A ratio:  0.51 Fransico Him MD Electronically signed by Fransico Him MD Signature Date/Time: 05/26/2020/1:39:33 PM    Final    Scheduled Meds: . amLODipine  10 mg Oral Daily  . furosemide  40 mg Intravenous BID  . insulin aspart  0-5 Units Subcutaneous QHS  . insulin aspart  0-9 Units Subcutaneous TID WC  . metFORMIN  850 mg Oral BID WC  . pioglitazone  15 mg Oral BID  . potassium chloride  40 mEq Oral Daily  . Rivaroxaban  15 mg Oral BID WC   Followed by  . [START ON 06/17/2020] rivaroxaban  20 mg Oral Q supper   Continuous Infusions:   LOS: 2 days   Time spent: Isabella, MD [facility] To contact the attending provider between 7A-7P or the covering provider during after hours 7P-7A, please log into the web site [email or web address] and access using universal [place] password for that web site. If you do not have the password, please call the hospital operator.  05/27/2020, 10:27 AM

## 2020-05-27 NOTE — Care Management (Signed)
Benefit check sent for xaralto, will result Monday, will need coupon card

## 2020-05-28 ENCOUNTER — Other Ambulatory Visit: Payer: Self-pay | Admitting: Family Medicine

## 2020-05-28 ENCOUNTER — Encounter: Payer: Self-pay | Admitting: Family Medicine

## 2020-05-28 ENCOUNTER — Inpatient Hospital Stay (HOSPITAL_COMMUNITY): Payer: Medicare Other

## 2020-05-28 LAB — CBC WITH DIFFERENTIAL/PLATELET
Abs Immature Granulocytes: 0.03 10*3/uL (ref 0.00–0.07)
Basophils Absolute: 0.1 10*3/uL (ref 0.0–0.1)
Basophils Relative: 1 %
Eosinophils Absolute: 0.3 10*3/uL (ref 0.0–0.5)
Eosinophils Relative: 3 %
HCT: 35.5 % — ABNORMAL LOW (ref 36.0–46.0)
Hemoglobin: 11.7 g/dL — ABNORMAL LOW (ref 12.0–15.0)
Immature Granulocytes: 0 %
Lymphocytes Relative: 22 %
Lymphs Abs: 1.9 10*3/uL (ref 0.7–4.0)
MCH: 29.9 pg (ref 26.0–34.0)
MCHC: 33 g/dL (ref 30.0–36.0)
MCV: 90.8 fL (ref 80.0–100.0)
Monocytes Absolute: 1.1 10*3/uL — ABNORMAL HIGH (ref 0.1–1.0)
Monocytes Relative: 13 %
Neutro Abs: 5.2 10*3/uL (ref 1.7–7.7)
Neutrophils Relative %: 61 %
Platelets: 272 10*3/uL (ref 150–400)
RBC: 3.91 MIL/uL (ref 3.87–5.11)
RDW: 14.2 % (ref 11.5–15.5)
WBC: 8.6 10*3/uL (ref 4.0–10.5)
nRBC: 0 % (ref 0.0–0.2)

## 2020-05-28 LAB — GLUCOSE, CAPILLARY
Glucose-Capillary: 157 mg/dL — ABNORMAL HIGH (ref 70–99)
Glucose-Capillary: 142 mg/dL — ABNORMAL HIGH (ref 70–99)
Glucose-Capillary: 169 mg/dL — ABNORMAL HIGH (ref 70–99)
Glucose-Capillary: 184 mg/dL — ABNORMAL HIGH (ref 70–99)

## 2020-05-28 LAB — COMPREHENSIVE METABOLIC PANEL
ALT: 9 U/L (ref 0–44)
AST: 12 U/L — ABNORMAL LOW (ref 15–41)
Albumin: 3.1 g/dL — ABNORMAL LOW (ref 3.5–5.0)
Alkaline Phosphatase: 59 U/L (ref 38–126)
Anion gap: 9 (ref 5–15)
BUN: 24 mg/dL — ABNORMAL HIGH (ref 8–23)
CO2: 26 mmol/L (ref 22–32)
Calcium: 8.8 mg/dL — ABNORMAL LOW (ref 8.9–10.3)
Chloride: 98 mmol/L (ref 98–111)
Creatinine, Ser: 0.97 mg/dL (ref 0.44–1.00)
GFR, Estimated: 57 mL/min — ABNORMAL LOW (ref >60)
Glucose, Bld: 147 mg/dL — ABNORMAL HIGH (ref 70–99)
Potassium: 3.9 mmol/L (ref 3.5–5.1)
Sodium: 133 mmol/L — ABNORMAL LOW (ref 135–145)
Total Bilirubin: 0.8 mg/dL (ref 0.3–1.2)
Total Protein: 6.3 g/dL — ABNORMAL LOW (ref 6.5–8.1)

## 2020-05-28 MED ORDER — FUROSEMIDE 20 MG PO TABS
20.0000 mg | ORAL_TABLET | Freq: Every day | ORAL | Status: DC
  Administered 2020-05-28 – 2020-05-29 (×2): 20 mg via ORAL
  Filled 2020-05-28 (×2): qty 1

## 2020-05-28 MED ORDER — RIVAROXABAN (XARELTO) VTE STARTER PACK (15 & 20 MG): ORAL_TABLET | ORAL | 0 refills | Status: DC

## 2020-05-28 MED FILL — XARELTO STARTER PACK: 15 & 20 | 30 days supply | Qty: 51 | Fill #0

## 2020-05-28 NOTE — TOC Initial Note (Addendum)
Transition of Care [facility]) - Initial/Assessment Note    Patient Details  Name: Kristin Cook MRN: [id number] Date of Birth: 1931-06-03  [facility] CM/SW Contact:    Kingsley Plan, RN Phone Number: 05/28/2020, 10:53 AM  Clinical Narrative:                  Called patient's daughter in [place] [phone] discussed home health PT. Jasmine December in agreement. Patient has had home health in past. Jasmine December would like the same agency if possible. She will check and find name of agency and call NCM back. She has NCM direct cell phone number.   Patient has walker and cane at home.   Patient does not have home oxygen. If needed will need oxygen saturation ambulation note and order.  Jasmine December called back . First preference is Engineer, mining. Called [place] with Chip Boer. She will review referral and called NCM back.  Expected Discharge Plan: Home w Home Health Services Barriers to Discharge: Continued Medical Work up   Patient Goals and CMS Choice Patient states their goals for this hospitalization and ongoing recovery are:: to return tohome CMS Medicare.gov Compare Post Acute Care list provided to:: Patient Choice offered to / list presented to : Patient,Adult Children  Expected Discharge Plan and Services Expected Discharge Plan: Home w Home Health Services   Discharge Planning Services: CM Consult Post Acute Care Choice: Home Health Living arrangements for the past 2 months: Single Family Home                 DME Arranged: N/A         HH Arranged: PT          Prior Living Arrangements/Services Living arrangements for the past 2 months: Single Family Home Lives with:: Adult Children Patient language and need for interpreter reviewed:: Yes Do you feel safe going back to the place where you live?: Yes      Need for Family Participation in Patient Care: Yes (Comment) Care giver support system in place?: Yes (comment) Current home services: DME Criminal  Activity/Legal Involvement Pertinent to Current Situation/Hospitalization: No - Comment as needed  Activities of Daily Living      Permission Sought/Granted Permission sought to share information with : Family Electrical engineer Permission granted to share information with : Yes, Verbal Permission Granted  Share Information with NAME: Rumaysa Sabatino [phone]           Emotional Assessment Appearance:: Appears stated age Attitude/Demeanor/Rapport: Charismatic,Engaged Affect (typically observed): Apprehensive Orientation: : Fluctuating Orientation (Suspected and/or reported Sundowners) Alcohol / Substance Use: Not Applicable Psych Involvement: No (comment)  Admission diagnosis:  SOB (shortness of breath) [R06.02] Hypoxia [R09.02] Acute hypoxemic respiratory failure (HCC) [J96.01] Pulmonary embolism, unspecified chronicity, unspecified pulmonary embolism type, unspecified whether acute cor pulmonale present (HCC) [I26.99] Patient Active Problem List   Diagnosis Date Noted  . Hyponatremia 05/26/2020  . Dementia (HCC) 05/26/2020  . HLD (hyperlipidemia) 05/26/2020  . Acute hypoxemic respiratory failure (HCC) 05/25/2020  . Compression fracture of lumbar spine, non-traumatic, initial encounter (HCC) 02/12/2020  . Hypoxia 02/12/2020  . COVID-19 virus infection 02/12/2020  . Mild cognitive impairment with memory loss 09/21/2018  . Onychomycosis of multiple toenails with type 2 diabetes mellitus (HCC) 10/22/2015  . Presbycusis of both ears 06/19/2015  . Severe nonproliferative diabetic retinopathy of left eye with macular edema (HCC) 12/30/2011  . Arthritis 09/29/2011  . Controlled diabetes mellitus type 2 with complications (HCC) 11/05/2010  .  HTN (hypertension) 11/05/2010  . Hyperlipidemia associated with type 2 diabetes mellitus (HCC) 11/05/2010   PCP:  Ronnald Nian, MD Pharmacy:   [facility] [phone] [place], [place] - [address] AT [facility]  OF [address] & [address] [place] [address] Phone: [phone] Fax: [phone]  [facility] [place], [place] - [address] [place], [address] [place], [address] [place] [address] Phone: [phone] Fax: [phone]  [facility] [place], [place] - [address] [place] [address] Phone: [phone] Fax: [phone]     Social Determinants of Health (SDOH) Interventions    Readmission Risk Interventions No flowsheet data found.

## 2020-05-28 NOTE — Progress Notes (Signed)
Heart Failure Navigator Progress Note  Assessed for Heart & Vascular TOC clinic readiness.  Unfortunately at this time the patient does not meet criteria due to dementia.   Navigator available for reassessment of patient.   Zayde Stroupe, RN, BSN Heart Failure Nurse Navigator [phone]  

## 2020-05-28 NOTE — TOC Benefit Eligibility Note (Signed)
Transition of Care [facility]) Benefit Eligibility Note    Patient Details  Name: Kristin Cook MRN: [id number] Date of Birth: 1931-06-09   Medication/Dose: ELIQUIS  2.5 MG BID   CO-PAY- $47.00   and  ELIQUIS  5 MG BID CO-PAY- $47.00  Covered?: Yes  Tier: 3 Drug  Prescription Coverage Preferred Pharmacy: [facility]  and  [place] with Person/Company/Phone Number:: DEL  @ [employer] # [phone]  Co-Pay: $47.00  Prior Approval: No  Deductible: Met  Additional Notes: APIXABAN : 10 MG BID : Crecencio Mc Phone Number: 05/28/2020, 10:58 AM

## 2020-05-28 NOTE — Progress Notes (Signed)
Inpatient Diabetes Program Recommendations  AACE/ADA: New Consensus Statement on Inpatient Glycemic Control (2015)  Target Ranges:  Prepandial:   less than 140 mg/dL      Peak postprandial:   less than 180 mg/dL (1-2 hours)      Critically ill patients:  140 - 180 mg/dL   Lab Results  Component Value Date   GLUCAP 142 (H) 05/28/2020   HGBA1C 8.0 (H) 05/26/2020    Review of Glycemic Control  Inpatient Diabetes Program Recommendations:   Please consider: -Novolog 0-6 units tid correction  Thank you, [name]. Hanks, RN, MSN, CDE  Diabetes Coordinator Inpatient Glycemic Control Team Team Pager [phone] (8am-5pm) 05/28/2020 1:48 PM

## 2020-05-28 NOTE — Progress Notes (Signed)
PROGRESS NOTE   Kristin Cook  NOI:[id number] DOB: [birth year] DOA: 05/25/2020 PCP: Kristin Lung, MD  Brief Narrative:  87 year old community dwelling white female (lives with daughter) HTN DM TY 2 Hyperplasctic colon polyp 2004  COVID -19 +Compression fracture 02/27/2020 + kyphoplasty-DC to SNF--a Completing 5 days of remdesivir at that time at that admission felt to have?  ILD---was to follow-up with pulmonology to repeat HRCT  Seen at PCP S OB X 48 hours-O2 sat 78%-Rx O2 2 L-->98% ?  LE edema X6 MO-increasing cognitive deficits since COVID diagnosis CT chest = indeterminate age PE + - multifocal PNA  Felt to also have right-sided heart failure symptoms likely secondary to PE-diuresing-echocardiogram pending-  Data Hb 12.2, wbc 9 A1c 8 Na 130 Bun/cr 16/0.9 Bnp 332  Hospital-Problem based course  Right-sided heart failure >indeterminant pulmonary embolism causing acute hypoxic respiratory failure on admission Diurese with Lasix IV 40 ii/day-->40 qd po wght and I/o inaccurate--RN to stand [facility] pls Will send home in am on PRN lasix dosing Pulmonary embolism-indeterminant and not severe Change heparin to Xarelto (likely 3 months duration) as physiology is not in keeping with decompensation DVT study of LE's neg 3/20 PNA PTA? 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   Difficult to place patient No  Consultants:   Pulmonology curb sided by admitting physician  Procedures: CT chest Duplex lower extremity-no dvt  Antimicrobials: None currently   Subjective:  Incoherent  Pleasantly confused Not eating much per RN ROS unreliable  Objective: Vitals:   05/27/20 1121 05/27/20 1726 05/27/20 2254 05/28/20 0529  BP: 138/68 (!) 128/54 126/63 (!) 129/57  Pulse: 83 88 81 68  Resp: _0 Temp: 97.6 F (36.4 C) 98.1 F (36.7 C) 98.5 F (36.9 C) 98.2 F (36.8 C)  TempSrc: Oral Oral Oral Axillary  SpO2: 99% 98% 98% 97%  Weight:    58.5 kg  Height:        Intake/Output Summary (Last 24 hours) at 05/28/2020 0832 Last data filed at 05/27/2020 1700 Gross per 24 hour  Intake 120 ml  Output 400 ml  Net -280 ml   Filed Weights   05/25/20 2321 05/27/20 0516 05/28/20 0529  Weight: 59.2 kg 57.4 kg 58.5 kg    Examination:  Frail in nad no focal deficit cta b no added sound rales rhonchi s1 s2 HSM across precord abd soft no rebound guard Neuro to motor is intact   Data Reviewed: personally reviewed   CBC    Component Value Date/Time   WBC 8.6 05/28/2020 0346   RBC  3.91 05/28/2020 0346   HGB 11.7 (L) 05/28/2020 0346   HGB 14.9 01/18/2020 1005   HCT 35.5 (L) 05/28/2020 0346   HCT 44.4 01/18/2020 1005   PLT 272 05/28/2020 0346   PLT 263 01/18/2020 1005   MCV 90.8 05/28/2020 0346   MCV 88 01/18/2020 1005   MCH 29.9 05/28/2020 0346   MCHC 33.0 05/28/2020 0346   RDW 14.2 05/28/2020 0346   RDW 11.8 01/18/2020 1005   LYMPHSABS 1.9 05/28/2020 0346   LYMPHSABS 0.9 01/18/2020 1005   MONOABS 1.1 (H) 05/28/2020 0346   EOSABS 0.3 05/28/2020 0346   EOSABS 0.1 01/18/2020 1005    BASOSABS 0.1 05/28/2020 0346   BASOSABS 0.1 01/18/2020 1005   CMP Latest Ref Rng & Units 05/28/2020 05/27/2020 05/26/2020  Glucose 70 - 99 mg/dL 147(H) 101(H) 145(H)  BUN 8 - 23 mg/dL 24(H) 19 13  Creatinine 0.44 - 1.00 mg/dL 0.97 0.99 0.84  Sodium 135 - 145 mmol/L 133(L) 135 132(L)  Potassium 3.5 - 5.1 mmol/L 3.9 3.4(L) 3.6  Chloride 98 - 111 mmol/L 98 97(L) 97(L)  CO2 22 - 32 mmol/L _0 Calcium 8.9 - 10.3 mg/dL 8.8(L) 8.9 8.8(L)  Total Protein 6.5 - 8.1 g/dL 6.3(L) 6.1(L) 6.2(L)  Total Bilirubin 0.3 - 1.2 mg/dL 0.8 0.4 0.5  Alkaline Phos 38 - 126 U/L 59 59 63  AST 15 - 41 U/L 12(L) 14(L) 13(L)  ALT 0 - 44 U/L _1 Radiology Studies: DG Chest 2 View  Result Date: 05/28/2020 CLINICAL DATA:  Pneumonia. EXAM: CHEST - 2 VIEW COMPARISON:  May 25, 2020. FINDINGS: Stable cardiomegaly. No pneumothorax or pleural effusion is noted. Stable bilateral diffuse interstitial densities are noted which may represent chronic interstitial Cook disease, although acute superimposed atypical inflammation cannot be excluded. Bony thorax is unremarkable. IMPRESSION: Stable bilateral diffuse interstitial densities are noted which may represent chronic interstitial Cook disease, although acute superimposed atypical inflammation cannot be excluded. Aortic Atherosclerosis (ICD10-I70.0). Electronically Signed   By: Marijo Conception M.D.   On: 05/28/2020 08:14   ECHOCARDIOGRAM COMPLETE  Result Date: 05/26/2020    ECHOCARDIOGRAM REPORT   Patient Name:   Kristin Cook Date of Exam: 05/26/2020 Medical Rec #:  [id number]        Height:       65.0 in Accession #:    [id number]       Weight:       130.5 lb Date of Birth:  1932-03-06        BSA:          1.650 m Patient Age:    10 years         BP:           137/54 mmHg Patient Gender: F                HR:           90 bpm. Exam Location:  Inpatient Procedure: 2D Echo, Cardiac Doppler and Color Doppler STAT ECHO Indications:    Pulmonary embolus  History:         Patient has prior history of Echocardiogram examinations, most                 recent 02/13/2020. Risk Factors:Hypertension, Dyslipidemia,                 Diabetes and Former Smoker.  Sonographer:    Clayton Lefort RDCS (AE) Referring Phys: [place]  [place] IMPRESSIONS  1. Left ventricular ejection fraction, by estimation, is 65 to 70%. The left ventricle has normal function. The left ventricle has no regional wall motion abnormalities. There is moderate concentric left ventricular hypertrophy. Left ventricular diastolic parameters are consistent with Grade I diastolic dysfunction (impaired relaxation). Elevated left ventricular end-diastolic pressure. There is the interventricular septum is flattened in systole, consistent with right ventricular pressure overload.  2. Right ventricular systolic function is moderately reduced. The right ventricular size is normal. There is severely elevated pulmonary artery systolic pressure. The estimated right ventricular systolic pressure is 83.2 mmHg.  3. The mitral valve is degenerative. No evidence of mitral valve regurgitation. Mild to moderate mitral stenosis. The mean mitral valve gradient is 5.0 mmHg at 87bpm.  4. The aortic valve is normal in structure. Aortic valve regurgitation is not visualized. No aortic stenosis is present.  5. The inferior vena cava is normal in size with <50% respiratory variability, suggesting right atrial pressure of 8 mmHg.  6. Tricuspid valve regurgitation is mild to moderate. FINDINGS  Left Ventricle: Left ventricular ejection fraction, by estimation, is 65 to 70%. The left ventricle has normal function. The left ventricle has no regional wall motion abnormalities. The left ventricular internal cavity size was normal in size. There is  moderate concentric left ventricular hypertrophy. The interventricular septum is flattened in systole, consistent with right ventricular pressure overload. Left ventricular diastolic parameters are consistent  with Grade I diastolic dysfunction (impaired  relaxation). Elevated left ventricular end-diastolic pressure. Right Ventricle: The right ventricular size is normal. No increase in right ventricular wall thickness. Right ventricular systolic function is moderately reduced. There is severely elevated pulmonary artery systolic pressure. The tricuspid regurgitant velocity is 4.33 m/s, and with an assumed right atrial pressure of 8 mmHg, the estimated right ventricular systolic pressure is 91.9 mmHg. Left Atrium: Left atrial size was normal in size. Right Atrium: Right atrial size was normal in size. Pericardium: There is no evidence of pericardial effusion. Mitral Valve: The mitral valve is degenerative in appearance. There is moderate thickening of the mitral valve leaflet(s). There is mild calcification of the mitral valve leaflet(s). Moderate mitral annular calcification. No evidence of mitral valve regurgitation. Moderate mitral valve stenosis. MV peak gradient, 14.1 mmHg. The mean mitral valve gradient is 5.0 mmHg. Tricuspid Valve: The tricuspid valve is normal in structure. Tricuspid valve regurgitation is mild to moderate. No evidence of tricuspid stenosis. Aortic Valve: The aortic valve is normal in structure. Aortic valve regurgitation is not visualized. No aortic stenosis is present. Aortic valve mean gradient measures 9.0 mmHg. Aortic valve peak gradient measures 18.1 mmHg. Aortic valve area, by VTI measures 1.55 cm. Pulmonic Valve: The pulmonic valve was normal in structure. Pulmonic valve regurgitation is trivial. No evidence of pulmonic stenosis. Aorta: The aortic root is normal in size and structure. Venous: The inferior vena cava is normal in size with less than 50% respiratory variability, suggesting right atrial pressure of 8 mmHg. IAS/Shunts: No atrial level shunt detected by color flow Doppler.  LEFT VENTRICLE PLAX 2D LVIDd:         3.20 cm  Diastology LVIDs:         2.50 cm  LV e' medial:    4.79  cm/s LV PW:         1.30 cm  LV E/e' medial:  19.7 LV IVS:        1.30 cm  LV e' lateral:   4.79 cm/s LVOT diam:  1.90 cm  LV E/e' lateral: 19.7 LV SV:         67 LV SV Index:   40 LVOT Area:     2.84 cm  RIGHT VENTRICLE            IVC RV Basal diam:  3.40 cm    IVC diam: 1.60 cm RV Mid diam:    3.50 cm RV S prime:     7.29 cm/s TAPSE (M-mode): 1.3 cm LEFT ATRIUM             Index       RIGHT ATRIUM           Index LA diam:        2.60 cm 1.58 cm/m  RA Area:     12.70 cm LA Vol (A2C):   51.6 ml 31.27 ml/m RA Volume:   29.00 ml  17.58 ml/m LA Vol (A4C):   47.1 ml 28.55 ml/m LA Biplane Vol: 52.8 ml 32.00 ml/m  AORTIC VALVE AV Area (Vmax):    1.77 cm AV Area (Vmean):   1.89 cm AV Area (VTI):     1.55 cm AV Vmax:           212.50 cm/s AV Vmean:          138.000 cm/s AV VTI:            0.430 m AV Peak Grad:      18.1 mmHg AV Mean Grad:      9.0 mmHg LVOT Vmax:         133.00 cm/s LVOT Vmean:        91.900 cm/s LVOT VTI:          0.235 m LVOT/AV VTI ratio: 0.55  AORTA Ao Root diam: 3.20 cm Ao Asc diam:  3.30 cm MITRAL VALVE                TRICUSPID VALVE MV Area (PHT): 1.89 cm     TR Peak grad:   75.0 mmHg MV Area VTI:   1.51 cm     TR Vmax:        433.00 cm/s MV Peak grad:  14.1 mmHg MV Mean grad:  5.0 mmHg     SHUNTS MV Vmax:       1.88 m/s     Systemic VTI:  0.24 m MV Vmean:      97.5 cm/s    Systemic Diam: 1.90 cm MV Decel Time: 401 msec MV E velocity: 94.50 cm/s MV A velocity: 184.00 cm/s MV E/A ratio:  0.51 Fransico Him MD Electronically signed by Fransico Him MD Signature Date/Time: 05/26/2020/1:39:33 PM    Final    VAS [place] LOWER EXTREMITY VENOUS (DVT)  Result Date: 05/27/2020  Lower Venous DVT Study Indications: Edema.  Comparison Study: Prior negative study done 02/13/20 Performing Technologist: Sharion Dove RVS  Examination Guidelines: A complete evaluation includes B-mode imaging, spectral Doppler, color Doppler, and power Doppler as needed of all accessible portions of each vessel. Bilateral  testing is considered an integral part of a complete examination. Limited examinations for reoccurring indications may be performed as noted. The reflux portion of the exam is performed with the patient in reverse Trendelenburg.  +---------+---------------+---------+-----------+----------+--------------+ RIGHT    CompressibilityPhasicitySpontaneityPropertiesThrombus Aging +---------+---------------+---------+-----------+----------+--------------+ CFV      Full           Yes      Yes                                 +---------+---------------+---------+-----------+----------+--------------+  SFJ      Full                                                        +---------+---------------+---------+-----------+----------+--------------+ FV Prox  Full                                                        +---------+---------------+---------+-----------+----------+--------------+ FV Mid   Full                                                        +---------+---------------+---------+-----------+----------+--------------+ FV DistalFull                    Yes                                 +---------+---------------+---------+-----------+----------+--------------+ PFV      Full                                                        +---------+---------------+---------+-----------+----------+--------------+ POP      Full           Yes      Yes                                 +---------+---------------+---------+-----------+----------+--------------+ PTV      Full                                                        +---------+---------------+---------+-----------+----------+--------------+ PERO     Full                                                        +---------+---------------+---------+-----------+----------+--------------+   +---------+---------------+---------+-----------+----------+--------------+ LEFT      CompressibilityPhasicitySpontaneityPropertiesThrombus Aging +---------+---------------+---------+-----------+----------+--------------+ CFV      Full           Yes      Yes                                 +---------+---------------+---------+-----------+----------+--------------+ SFJ      Full                                                        +---------+---------------+---------+-----------+----------+--------------+  FV Prox  Full                                                        +---------+---------------+---------+-----------+----------+--------------+ FV Mid   Full                                                        +---------+---------------+---------+-----------+----------+--------------+ FV DistalFull                                                        +---------+---------------+---------+-----------+----------+--------------+ PFV      Full                                                        +---------+---------------+---------+-----------+----------+--------------+ POP      Full           Yes      Yes                                 +---------+---------------+---------+-----------+----------+--------------+ PTV      Full                                                        +---------+---------------+---------+-----------+----------+--------------+ PERO     Full                                                        +---------+---------------+---------+-----------+----------+--------------+ Soleal   Full                                                        +---------+---------------+---------+-----------+----------+--------------+     Summary: RIGHT: - Findings appear essentially unchanged compared to previous examination. - There is no evidence of deep vein thrombosis in the lower extremity.  LEFT: - Findings appear essentially unchanged compared to previous examination. - There is no evidence of deep vein  thrombosis in the lower extremity.  *See table(s) above for measurements and observations. Electronically signed by Ruta Hinds MD on 05/27/2020 at 11:12:16 AM.    Final    Scheduled Meds: . amLODipine  10 mg Oral Daily  . furosemide  20 mg Oral Daily  . insulin aspart  0-5 Units Subcutaneous QHS  . insulin aspart  0-9 Units Subcutaneous TID WC  .  metFORMIN  850 mg Oral Daily  . pioglitazone  15 mg Oral Daily  . potassium chloride  40 mEq Oral Daily  . Rivaroxaban  15 mg Oral BID WC   Followed by  . [START ON 06/17/2020] rivaroxaban  20 mg Oral Q supper   Continuous Infusions:   LOS: 3 days   Time spent: 57  Nita Sells, MD [facility] To contact the attending provider between 7A-7P or the covering provider during after hours 7P-7A, please log into the web site [email or web address] and access using universal [place] password for that web site. If you do not have the password, please call the hospital operator.  05/28/2020, 8:32 AM

## 2020-05-28 NOTE — Addendum Note (Signed)
Addended by: Ronnald Nian on: 05/28/2020 05:38 PM   Modules accepted: Orders

## 2020-05-28 NOTE — TOC Benefit Eligibility Note (Signed)
Patient Advocate Encounter  Insurance verification completed.    The patient is currently admitted and upon discharge could be taking Eliquis 5 mg.  The current 30 day co-pay is, $47.00.   The patient is currently admitted and upon discharge could be taking Xarelto 20 mg.  The current 30 day co-pay is, $47.00.   The patient is insured through [employer] Medicare Part D     Jeffrey Smith, CPhT Pharmacy Patient Advocate Specialist [place] [facility] Team Direct Number: [phone]  Fax: [phone]        

## 2020-05-29 ENCOUNTER — Encounter: Payer: Self-pay | Admitting: Family Medicine

## 2020-05-29 ENCOUNTER — Other Ambulatory Visit: Payer: Self-pay | Admitting: Family Medicine

## 2020-05-29 LAB — COMPREHENSIVE METABOLIC PANEL
ALT: 9 U/L (ref 0–44)
AST: 12 U/L — ABNORMAL LOW (ref 15–41)
Albumin: 3.2 g/dL — ABNORMAL LOW (ref 3.5–5.0)
Alkaline Phosphatase: 62 U/L (ref 38–126)
Anion gap: 9 (ref 5–15)
BUN: 24 mg/dL — ABNORMAL HIGH (ref 8–23)
CO2: 27 mmol/L (ref 22–32)
Calcium: 9 mg/dL (ref 8.9–10.3)
Chloride: 98 mmol/L (ref 98–111)
Creatinine, Ser: 0.99 mg/dL (ref 0.44–1.00)
GFR, Estimated: 56 mL/min — ABNORMAL LOW (ref >60)
Glucose, Bld: 194 mg/dL — ABNORMAL HIGH (ref 70–99)
Potassium: 4.1 mmol/L (ref 3.5–5.1)
Sodium: 134 mmol/L — ABNORMAL LOW (ref 135–145)
Total Bilirubin: 0.7 mg/dL (ref 0.3–1.2)
Total Protein: 6.6 g/dL (ref 6.5–8.1)

## 2020-05-29 LAB — GLUCOSE, CAPILLARY
Glucose-Capillary: 174 mg/dL — ABNORMAL HIGH (ref 70–99)
Glucose-Capillary: 179 mg/dL — ABNORMAL HIGH (ref 70–99)

## 2020-05-29 LAB — CBC WITH DIFFERENTIAL/PLATELET
Abs Immature Granulocytes: 0.04 10*3/uL (ref 0.00–0.07)
Basophils Absolute: 0.1 10*3/uL (ref 0.0–0.1)
Basophils Relative: 1 %
Eosinophils Absolute: 0.1 10*3/uL (ref 0.0–0.5)
Eosinophils Relative: 1 %
HCT: 38.1 % (ref 36.0–46.0)
Hemoglobin: 12.7 g/dL (ref 12.0–15.0)
Immature Granulocytes: 0 %
Lymphocytes Relative: 14 %
Lymphs Abs: 1.3 10*3/uL (ref 0.7–4.0)
MCH: 30.2 pg (ref 26.0–34.0)
MCHC: 33.3 g/dL (ref 30.0–36.0)
MCV: 90.5 fL (ref 80.0–100.0)
Monocytes Absolute: 1.3 10*3/uL — ABNORMAL HIGH (ref 0.1–1.0)
Monocytes Relative: 14 %
Neutro Abs: 6.4 10*3/uL (ref 1.7–7.7)
Neutrophils Relative %: 70 %
Platelets: 278 10*3/uL (ref 150–400)
RBC: 4.21 MIL/uL (ref 3.87–5.11)
RDW: 14 % (ref 11.5–15.5)
WBC: 9.2 10*3/uL (ref 4.0–10.5)
nRBC: 0 % (ref 0.0–0.2)

## 2020-05-29 LAB — MAGNESIUM: Magnesium: 1.7 mg/dL (ref 1.7–2.4)

## 2020-05-29 MED ORDER — FUROSEMIDE 20 MG PO TABS: 20.0000 mg | ORAL_TABLET | Freq: Every day | ORAL | 0 refills | Status: DC

## 2020-05-29 MED ORDER — RIVAROXABAN 20 MG PO TABS: 20.0000 mg | ORAL_TABLET | Freq: Every day | ORAL | 1 refills | Status: DC

## 2020-05-29 MED FILL — XARELTO 20 MG TABLET: 20 | 30 days supply | Qty: 30 | Fill #0

## 2020-05-29 MED FILL — FUROSEMIDE 20 MG TAB: 20 | 30 days supply | Qty: 30 | Fill #0

## 2020-05-29 NOTE — Care Management Important Message (Signed)
Important Message  Patient Details  Name: Kristin Cook MRN: [id number] Date of Birth: 18-Apr-1931   Medicare Important Message Given:  Yes     Ventura Leggitt Stefan Church 05/29/2020, 4:11 PM

## 2020-05-29 NOTE — Progress Notes (Signed)
Nsg Discharge Note  Patient's daughter [name] provided with discharge instructions. Questions answered. Xarelto starter pack, the next month supply, and lasix dropped off to room.  Admit Date:  05/25/2020 Discharge date: 05/29/2020   Kristin Cook to be D/C'd Home per MD order.  AVS completed.  Copy for chart, and copy for patient signed, and dated. Patient/caregiver able to verbalize understanding.  Discharge Medication: Allergies as of 05/29/2020      Reactions   Penicillins Swelling   Sulfa Antibiotics Swelling   Weak    Sulfamethoxazole Other (See Comments)   Feels like her legs are weighted down      Medication List    STOP taking these medications   lisinopril-hydrochlorothiazide 20-12.5 MG tablet Commonly known as: ZESTORETIC   rosuvastatin 20 MG tablet Commonly known as: CRESTOR     TAKE these medications   acetaminophen 325 MG tablet Commonly known as: TYLENOL Take 2 tablets (650 mg total) by mouth every 6 (six) hours as needed for mild pain (or Fever >/= 101).   albuterol 108 (90 Base) MCG/ACT inhaler Commonly known as: VENTOLIN HFA Inhale 2 puffs into the lungs every 6 (six) hours as needed for wheezing or shortness of breath.   amLODipine 10 MG tablet Commonly known as: NORVASC TAKE 1 TABLET(10 MG) BY MOUTH DAILY What changed:   how much to take  how to take this  when to take this  additional instructions   furosemide 20 MG tablet Commonly known as: LASIX Take 1 tablet (20 mg total) by mouth daily. Start taking on: May 30, 2020   OneTouch Ultra test strip Generic drug: glucose blood USE AS DIRECTED   pioglitazone-metformin 15-850 MG tablet Commonly known as: ACTOPLUS MET TAKE 1 TABLET BY MOUTH TWICE DAILY WITH A MEAL What changed: when to take this   polyethylene glycol 17 g packet Commonly known as: MIRALAX / GLYCOLAX Take 17 g by mouth daily as needed for mild constipation.   Rivaroxaban Stater Pack (15 mg and 20 mg) Commonly  known as: XARELTO STARTER PACK Take one 3m tablet by mouth twice a day until 4/9. On 06/17/20, switch to one 265mtablet once a day. Take with food.   rivaroxaban 20 MG Tabs tablet Commonly known as: XARELTO Take 1 tablet (20 mg total) by mouth daily with supper. Start taking on: June 26, 2020       Discharge Assessment: Vitals:   05/29/20 0520 05/29/20 1202  BP: 140/69 120/66  Pulse: 83 85  Resp: 20 20  Temp: 98 F (36.7 C) 98.1 F (36.7 C)  SpO2: 95% 94%   Skin clean, dry and intact without evidence of skin break down, no evidence of skin tears noted. IV catheter discontinued intact. Site without signs and symptoms of complications - no redness or edema noted at insertion site, patient denies c/o pain - only slight tenderness at site.  Dressing with slight pressure applied.  D/c Instructions-Education: Discharge instructions given to patient/family with verbalized understanding. D/c education completed with patient/family including follow up instructions, medication list, d/c activities limitations if indicated, with other d/c instructions as indicated by MD - patient able to verbalize understanding, all questions fully answered. Patient instructed to return to ED, call 911, or call MD for any changes in condition.  [place] private auto.  LyErasmo LeventhalRN 05/29/2020 1:28 PM

## 2020-05-29 NOTE — Discharge Summary (Addendum)
Physician Discharge Summary  Kristin Cook LTJ:[id number] DOB: 08-01-1931 DOA: 05/25/2020  PCP: [name], MD  Admit date: 05/25/2020 Discharge date: 05/29/2020  Time spent: 30 minutes  Recommendations for Outpatient Follow-up:  1. Needs Chem-12 CBC 1 week 2. Discontinued ACE/HCTZ in favor of Lasix which may need to be adjusted in the outpatient setting 3. Please educate the patient in outpatient setting if possible about diet etc. 4. Please consider goals of care in the outpatient setting to prevent rehospitalization and discussed with family 5. Suggest discontinuation of Actos etc. if goals are comfort eventually  Discharge Diagnoses:  MAIN problem for hospitalization   Decompensated right-sided heart failure in addition to probable etiology being subacute pulmonary embolism with hemodynamically stable physiology  Please see below for itemized issues addressed in HOpsital- refer to other progress notes for clarity if needed  Discharge Condition: Overall good however potential for readmission  Diet recommendation: Improved diabetic  Filed Weights   05/28/20 0529 05/28/20 0853 05/29/20 0520  Weight: 58.5 kg 56.7 kg 56.2 kg    History of present illness:  87 year old community dwelling white female (lives with daughter-in-law [name]) HTN DM TY 2 Hyperplastic colon polyp 2004  COVID -19 +Compression fracture 02/27/2020 + kyphoplasty-DC to SNF--had 5 days of remdesivir- at that admission felt to have? ILD---was to follow-up with pulmonology to repeat HRCT  Seen at PCP S OB X 48 hours-O2 sat 78%-Rx O2 2 L-->98% ? LE edema X6 MO-increasing cognitive deficits since COVID diagnosis  CT chest = indeterminate age PE + - multifocal PNA  Felt to also have right-sided heart failure symptoms likely secondary to PE-diuresing-echocardiogram pending-  Hospital Course:   Right-sided heart failure >indeterminant pulmonary embolism causing acute hypoxic respiratory failure on  admission Diurese with Lasix IV 40 ii/day--> current dosing of diuresis Will discharge on scheduled Lasix with daily weights PCP to check labs in the outpatient setting and discuss goals of care Pulmonary embolism-indeterminant and not severe Change heparin to Xarelto (likely 3 months duration) as physiology is not in keeping with decompensation DVT study of LE's neg 3/20 PNA PTA?  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 Long discussion with [name] son on Discharge   Discharge Exam: Vitals:   05/28/20 2329 05/29/20 0520  BP: 131/64 140/69  Pulse: (!) 101 83  Resp: 20 20  Temp: 99.5 F (37.5 C) 98 F (36.7 C)  SpO2: 95% 95%    Subj on day of d/c   Confused but pleasant, no distress Not eating a lot per  General Exam on discharge  eomi ncat cta b no rales no rhonchi abd soft nt nd no rebound s1 s2 no m Neuro intact to movement Psych confused  Discharge Instructions   Discharge Instructions    Diet - low sodium heart healthy   Complete by: As directed    Discharge instructions   Complete by: As directed  You probably had a small blood clot in your lungs likely related to your prior diagnosis of coronavirus in addition to may be some heart failure from the lung clot Look at your medications  carefully as I have discontinued some of the blood pressure meds that you are on in favor of Lasix which will control your volume status You will need careful monitoring of your fluid status in the outpatient setting in the next week to week and a half and if you gain more than 3 pounds in 24 hours take an extra dose of Lasix I would recommend that if you do not do well we consider having discussions with palliative care in the outpatient setting regarding discussions surrounding the end-of-life-this does not however need to be done right away but only for advanced planning Take care and good luck   Increase activity slowly   Complete by: As directed      Allergies as of 05/29/2020      Reactions   Penicillins Swelling   Sulfa Antibiotics Swelling   Weak    Sulfamethoxazole Other (See Comments)   Feels like her legs are weighted down      Medication List    STOP taking these medications   lisinopril-hydrochlorothiazide 20-12.5 MG tablet Commonly known as: ZESTORETIC   rosuvastatin 20 MG tablet Commonly known as: CRESTOR     TAKE these medications   acetaminophen 325 MG tablet Commonly known as: TYLENOL Take 2 tablets (650 mg total) by mouth every 6 (six) hours as needed for mild pain (or Fever >/= 101).   albuterol 108 (90 Base) MCG/ACT inhaler Commonly known as: VENTOLIN HFA Inhale 2 puffs into the lungs every 6 (six) hours as needed for wheezing or shortness of breath.   amLODipine 10 MG tablet Commonly known as: NORVASC TAKE 1 TABLET(10 MG) BY MOUTH DAILY What changed:   how much to take  how to take this  when to take this  additional instructions   furosemide 20 MG tablet Commonly known as: LASIX Take 1 tablet (20 mg total) by mouth daily. Start taking on: May 30, 2020   OneTouch Ultra test strip Generic drug: glucose blood USE AS DIRECTED   pioglitazone-metformin 15-850 MG tablet Commonly known as: ACTOPLUS MET TAKE 1 TABLET BY MOUTH TWICE DAILY WITH  A MEAL What changed: when to take this   polyethylene glycol 17 g packet Commonly known as: MIRALAX / GLYCOLAX Take 17 g by mouth daily as needed for mild constipation.   Rivaroxaban Stater Pack (15 mg and 20 mg) Commonly known as: XARELTO STARTER PACK Take one 47m tablet by mouth twice a day until 4/9. On 06/17/20, switch to one 277mtablet once a day. Take with food.   rivaroxaban 20 MG Tabs tablet Commonly known as: XARELTO Take 1 tablet (20 mg total) by mouth daily with supper. Start taking on: June 26, 2020      Allergies  Allergen Reactions  . Penicillins Swelling  . Sulfa Antibiotics Swelling    Weak   . Sulfamethoxazole Other (See Comments)    Feels like her legs are weighted down    [facility] Information    [facility]                The results of significant diagnostics from this hospitalization (including imaging, microbiology, ancillary and laboratory) are listed below for reference.    Significant Diagnostic Studies: DG Chest 2 View  Result Date: 05/28/2020 CLINICAL DATA:  Pneumonia. EXAM: CHEST - 2 VIEW COMPARISON:  May 25, 2020. FINDINGS: Stable cardiomegaly. No pneumothorax or pleural effusion is noted. Stable bilateral diffuse interstitial densities are noted which may represent chronic interstitial lung disease, although acute superimposed atypical inflammation cannot be excluded. Bony thorax is unremarkable. IMPRESSION: Stable bilateral diffuse interstitial densities are noted which may represent chronic interstitial lung disease, although acute superimposed atypical inflammation cannot be excluded. Aortic Atherosclerosis (ICD10-I70.0). Electronically Signed   By: Marijo Conception M.D.   On: 05/28/2020 08:14   CT Angio Chest PE W and/or Wo Contrast  Result Date: 05/25/2020 CLINICAL DATA:  Shortness of breath for 2 days.  Hypoxia. EXAM: CT ANGIOGRAPHY CHEST WITH CONTRAST TECHNIQUE: Multidetector CT imaging of the chest was  performed using the standard protocol during bolus administration of intravenous contrast. Multiplanar CT image reconstructions and MIPs were obtained to evaluate the vascular anatomy. CONTRAST:  39m OMNIPAQUE IOHEXOL 350 MG/ML SOLN COMPARISON:  Multiple exams, including 04/27/2020 FINDINGS: Cardiovascular: There is a small web in the left lower lobe pulmonary artery on images 141 through 157 of series 7 indicating remote/chronic pulmonary embolus, but no acute pulmonary embolus is demonstrated. Mild to moderate cardiomegaly. Mitral valve calcification. Coronary, aortic arch, and branch vessel atherosclerotic vascular disease. Mediastinum/Nodes: Subcarinal node 1.4 cm in short axis on image 49 series 6, previously 1.6 cm. Paratracheal node 0.9 cm in short axis on image 37 series 6, previously 0.8 cm. Prevascular node 0.9 cm in short axis on image 38 series 6, previously 0.8 cm. Lungs/Pleura: Bilateral interstitial accentuation with patchy ground-glass opacities. Although much of this background appearance is likely attributable to UIP, the ground-glass densities are increased from prior and fairly widespread, raising the possibility of a component of pulmonary edema, atypical pneumonia, acute hypersensitivity reaction, or acute eosinophilic pneumonia. Previous subpleural nodular area of concern in the left upper lobe is unchanged on image 46 of series 6, although somewhat obscured by surrounding additional opacities. Upper Abdomen: Unremarkable Musculoskeletal: Lower thoracic vertebral augmentation. Review of the MIP images confirms the above findings. IMPRESSION: 1. Small web in the left lower lobe pulmonary artery indicating remote/chronic pulmonary embolus, but no acute pulmonary embolus is demonstrated. 2. Bilateral interstitial accentuation with patchy ground-glass opacities. Although much of this background appearance is likely attributable to UIP, the ground-glass densities are increased from prior and  fairly widespread, raising the possibility of a component of pulmonary edema, atypical pneumonia, acute hypersensitivity reaction, or acute eosinophilic pneumonia. 3. Mild to moderate cardiomegaly. Mitral valve calcification. 4. Coronary, aortic arch, and branch vessel atherosclerotic vascular disease. 5. The potential 8 mm nodule peripherally in the left upper lobe is unchanged over the past month. Original recommendation for 3 month follow up remains in effect. Aortic Atherosclerosis (ICD10-I70.0). Electronically Signed   By: WVan ClinesM.D.   On: 05/25/2020 20:57   DG Chest Portable 1 View  Result Date: 05/25/2020 CLINICAL DATA:  Shortness of breath for the past 2 days. EXAM: PORTABLE CHEST 1 VIEW COMPARISON:  CT chest dated April 27, 2020. FINDINGS: Unchanged borderline cardiomegaly. Similar appearing diffuse interstitial thickening. No focal consolidation, pleural effusion, or pneumothorax. No acute osseous abnormality. Prior T12 cement augmentation. IMPRESSION: 1.  No active cardiopulmonary disease. 2. Unchanged chronic interstitial lung disease. Electronically Signed   By: WTitus DubinM.D.   On: 05/25/2020 16:36   ECHOCARDIOGRAM COMPLETE  Result Date: 05/26/2020    ECHOCARDIOGRAM REPORT   Patient Name:   Kristin SAMBRANODate of Exam: 05/26/2020 Medical Rec #:  [id number]        Height:       65.0 in Accession #:    [id number]       Weight:       130.5 lb Date of Birth:  11/17/1931        BSA:          1.650 m Patient Age:    14 years         BP:           137/54 mmHg Patient Gender: F                HR:           90 bpm. Exam Location:  Inpatient Procedure: 2D Echo, Cardiac Doppler and Color Doppler STAT ECHO Indications:    Pulmonary embolus  History:        Patient has prior history of Echocardiogram examinations, most                 recent 02/13/2020. Risk Factors:Hypertension, Dyslipidemia,                 Diabetes and Former Smoker.  Sonographer:    Clayton Lefort RDCS (AE) Referring  Phys: [phone] [place]  1. Left ventricular ejection fraction, by estimation, is 65 to 70%. The left ventricle has normal function. The left ventricle has no regional wall motion abnormalities. There is moderate concentric left ventricular hypertrophy. Left ventricular diastolic parameters are consistent with Grade I diastolic dysfunction (impaired relaxation). Elevated left ventricular end-diastolic pressure. There is the interventricular septum is flattened in systole, consistent with right ventricular pressure overload.  2. Right ventricular systolic function is moderately reduced. The right ventricular size is normal. There is severely elevated pulmonary artery systolic pressure. The estimated right ventricular systolic pressure is 67.5 mmHg.  3. The mitral valve is degenerative. No evidence of mitral valve regurgitation. Mild to moderate mitral stenosis. The mean mitral valve gradient is 5.0 mmHg at 87bpm.  4. The aortic valve is normal in structure. Aortic valve regurgitation is not visualized. No aortic stenosis is present.  5. The inferior vena cava is normal in size with <50% respiratory variability, suggesting right atrial pressure of 8 mmHg.  6. Tricuspid valve regurgitation is mild to moderate. FINDINGS  Left Ventricle: Left ventricular ejection fraction, by estimation, is 65 to 70%. The left ventricle has normal function. The left ventricle has no regional wall motion abnormalities. The left ventricular internal cavity size was normal in size. There is  moderate concentric left ventricular hypertrophy. The interventricular septum is flattened in systole, consistent with right ventricular pressure overload. Left ventricular diastolic parameters are consistent with Grade I diastolic dysfunction (impaired  relaxation). Elevated left ventricular end-diastolic pressure. Right Ventricle: The right ventricular size is normal. No increase in right ventricular wall thickness. Right  ventricular systolic function is moderately reduced. There is severely elevated pulmonary artery systolic pressure. The tricuspid regurgitant velocity is 4.33 m/s, and with an assumed right atrial pressure of 8 mmHg, the estimated right ventricular systolic pressure is 91.6 mmHg. Left Atrium: Left atrial size was normal in size. Right Atrium: Right atrial size was normal in size. Pericardium: There is no evidence of pericardial effusion. Mitral Valve: The mitral valve is degenerative in appearance. There is moderate thickening of the mitral valve leaflet(s). There is mild calcification of the mitral valve leaflet(s). Moderate mitral annular calcification. No evidence of mitral valve regurgitation. Moderate mitral valve stenosis. MV peak gradient, 14.1 mmHg.  The mean mitral valve gradient is 5.0 mmHg. Tricuspid Valve: The tricuspid valve is normal in structure. Tricuspid valve regurgitation is mild to moderate. No evidence of tricuspid stenosis. Aortic Valve: The aortic valve is normal in structure. Aortic valve regurgitation is not visualized. No aortic stenosis is present. Aortic valve mean gradient measures 9.0 mmHg. Aortic valve peak gradient measures 18.1 mmHg. Aortic valve area, by VTI measures 1.55 cm. Pulmonic Valve: The pulmonic valve was normal in structure. Pulmonic valve regurgitation is trivial. No evidence of pulmonic stenosis. Aorta: The aortic root is normal in size and structure. Venous: The inferior vena cava is normal in size with less than 50% respiratory variability, suggesting right atrial pressure of 8 mmHg. IAS/Shunts: No atrial level shunt detected by color flow Doppler.  LEFT VENTRICLE PLAX 2D LVIDd:         3.20 cm  Diastology LVIDs:         2.50 cm  LV e' medial:    4.79 cm/s LV PW:         1.30 cm  LV E/e' medial:  19.7 LV IVS:        1.30 cm  LV e' lateral:   4.79 cm/s LVOT diam:     1.90 cm  LV E/e' lateral: 19.7 LV SV:         67 LV SV Index:   40 LVOT Area:     2.84 cm  RIGHT  VENTRICLE            IVC RV Basal diam:  3.40 cm    IVC diam: 1.60 cm RV Mid diam:    3.50 cm RV S prime:     7.29 cm/s TAPSE (M-mode): 1.3 cm LEFT ATRIUM             Index       RIGHT ATRIUM           Index LA diam:        2.60 cm 1.58 cm/m  RA Area:     12.70 cm LA Vol (A2C):   51.6 ml 31.27 ml/m RA Volume:   29.00 ml  17.58 ml/m LA Vol (A4C):   47.1 ml 28.55 ml/m LA Biplane Vol: 52.8 ml 32.00 ml/m  AORTIC VALVE AV Area (Vmax):    1.77 cm AV Area (Vmean):   1.89 cm AV Area (VTI):     1.55 cm AV Vmax:           212.50 cm/s AV Vmean:          138.000 cm/s AV VTI:            0.430 m AV Peak Grad:      18.1 mmHg AV Mean Grad:      9.0 mmHg LVOT Vmax:         133.00 cm/s LVOT Vmean:        91.900 cm/s LVOT VTI:          0.235 m LVOT/AV VTI ratio: 0.55  AORTA Ao Root diam: 3.20 cm Ao Asc diam:  3.30 cm MITRAL VALVE                TRICUSPID VALVE MV Area (PHT): 1.89 cm     TR Peak grad:   75.0 mmHg MV Area VTI:   1.51 cm     TR Vmax:        433.00 cm/s MV Peak grad:  14.1 mmHg MV Mean grad:  5.0 mmHg  SHUNTS MV Vmax:       1.88 m/s     Systemic VTI:  0.24 m MV Vmean:      97.5 cm/s    Systemic Diam: 1.90 cm MV Decel Time: 401 msec MV E velocity: 94.50 cm/s MV A velocity: 184.00 cm/s MV E/A ratio:  0.51 Fransico Him MD Electronically signed by Fransico Him MD Signature Date/Time: 05/26/2020/1:39:33 PM    Final    VAS [place] LOWER EXTREMITY VENOUS (DVT)  Result Date: 05/27/2020  Lower Venous DVT Study Indications: Edema.  Comparison Study: Prior negative study done 02/13/20 Performing Technologist: Sharion Dove RVS  Examination Guidelines: A complete evaluation includes B-mode imaging, spectral Doppler, color Doppler, and power Doppler as needed of all accessible portions of each vessel. Bilateral testing is considered an integral part of a complete examination. Limited examinations for reoccurring indications may be performed as noted. The reflux portion of the exam is performed with the patient in reverse  Trendelenburg.  +---------+---------------+---------+-----------+----------+--------------+ RIGHT    CompressibilityPhasicitySpontaneityPropertiesThrombus Aging +---------+---------------+---------+-----------+----------+--------------+ CFV      Full           Yes      Yes                                 +---------+---------------+---------+-----------+----------+--------------+ SFJ      Full                                                        +---------+---------------+---------+-----------+----------+--------------+ FV Prox  Full                                                        +---------+---------------+---------+-----------+----------+--------------+ FV Mid   Full                                                        +---------+---------------+---------+-----------+----------+--------------+ FV DistalFull                    Yes                                 +---------+---------------+---------+-----------+----------+--------------+ PFV      Full                                                        +---------+---------------+---------+-----------+----------+--------------+ POP      Full           Yes      Yes                                 +---------+---------------+---------+-----------+----------+--------------+ PTV      Full                                                        +---------+---------------+---------+-----------+----------+--------------+  PERO     Full                                                        +---------+---------------+---------+-----------+----------+--------------+   +---------+---------------+---------+-----------+----------+--------------+ LEFT     CompressibilityPhasicitySpontaneityPropertiesThrombus Aging +---------+---------------+---------+-----------+----------+--------------+ CFV      Full           Yes      Yes                                  +---------+---------------+---------+-----------+----------+--------------+ SFJ      Full                                                        +---------+---------------+---------+-----------+----------+--------------+ FV Prox  Full                                                        +---------+---------------+---------+-----------+----------+--------------+ FV Mid   Full                                                        +---------+---------------+---------+-----------+----------+--------------+ FV DistalFull                                                        +---------+---------------+---------+-----------+----------+--------------+ PFV      Full                                                        +---------+---------------+---------+-----------+----------+--------------+ POP      Full           Yes      Yes                                 +---------+---------------+---------+-----------+----------+--------------+ PTV      Full                                                        +---------+---------------+---------+-----------+----------+--------------+ PERO     Full                                                        +---------+---------------+---------+-----------+----------+--------------+  Soleal   Full                                                        +---------+---------------+---------+-----------+----------+--------------+     Summary: RIGHT: - Findings appear essentially unchanged compared to previous examination. - There is no evidence of deep vein thrombosis in the lower extremity.  LEFT: - Findings appear essentially unchanged compared to previous examination. - There is no evidence of deep vein thrombosis in the lower extremity.  *See table(s) above for measurements and observations. Electronically signed by Ruta Hinds MD on 05/27/2020 at 11:12:16 AM.    Final     Microbiology: Recent Results (from the past  240 hour(s))  SARS CORONAVIRUS 2 (TAT 6-24 HRS) Nasopharyngeal Nasopharyngeal Swab     Status: None   Collection Time: 05/25/20  3:42 PM   Specimen: Nasopharyngeal Swab  Result Value Ref Range Status   SARS Coronavirus 2 NEGATIVE NEGATIVE Final    Comment: (NOTE) SARS-CoV-2 target nucleic acids are NOT DETECTED.  The SARS-CoV-2 RNA is generally detectable in upper and lower respiratory specimens during the acute phase of infection. Negative results do not preclude SARS-CoV-2 infection, do not rule out co-infections with other pathogens, and should not be used as the sole basis for treatment or other patient management decisions. Negative results must be combined with clinical observations, patient history, and epidemiological information. The expected result is Negative.  Fact Sheet for Patients: SugarRoll.be  Fact Sheet for Healthcare Providers: https://www.woods-mathews.com/  This test is not yet approved or cleared by the [place] FDA and  has been authorized for detection and/or diagnosis of SARS-CoV-2 by FDA under an Emergency Use Authorization (EUA). This EUA will remain  in effect (meaning this test can be used) for the duration of the COVID-19 declaration under Se ction 564(b)(1) of the Act, 21 U.S.C. section 360bbb-3(b)(1), unless the authorization is terminated or revoked sooner.  Performed at [facility], [place] [address] [place], [place] [address]      Labs: Basic Metabolic Panel: Recent Labs  Lab 05/25/20 1508 05/26/20 0833 05/27/20 0027 05/28/20 0346 05/29/20 0023  NA 130* 132* 135 133* 134*  K 4.6 3.6 3.4* 3.9 4.1  CL 97* 97* 97* 98 98  CO2 _0 GLUCOSE 206* 145* 101* 147* 194*  BUN _1 24* 24*  CREATININE 0.91 0.84 0.99 0.97 0.99  CALCIUM 9.0 8.8* 8.9 8.8* 9.0  MG  --   --  1.6*  --  1.7   Liver Function Tests: Recent Labs  Lab 05/25/20 1508 05/26/20 0833 05/27/20 0027  05/28/20 0346 05/29/20 0023  AST 13* 13* 14* 12* 12*  ALT _2 ALKPHOS 66 63 59 59 62  BILITOT 0.7 0.5 0.4 0.8 0.7  PROT 6.3* 6.2* 6.1* 6.3* 6.6  ALBUMIN 3.3* 3.3* 3.1* 3.1* 3.2*   No results for input(s): LIPASE, AMYLASE in the last 168 hours. No results for input(s): AMMONIA in the last 168 hours. CBC: Recent Labs  Lab 05/25/20 1508 05/26/20 0319 05/26/20 0833 05/27/20 0027 05/28/20 0346 05/29/20 0023  WBC 6.8 9.3 7.8 9.0 8.6 9.2  NEUTROABS 5.0  --  5.2 5.9 5.2 6.4  HGB 12.3 12.2 12.3 11.7* 11.7* 12.7  HCT 35.9* 35.2* 35.7* 35.1* 35.5* 38.1  MCV  89.3 88.0 89.0 89.1 90.8 90.5  PLT 303 288 336 300 272 278   Cardiac Enzymes: No results for input(s): CKTOTAL, CKMB, CKMBINDEX, TROPONINI in the last 168 hours. BNP: BNP (last 3 results) Recent Labs    02/12/20 1208 05/25/20 1508  BNP 234.2* 332.2*    ProBNP (last 3 results) No results for input(s): PROBNP in the last 8760 hours.  CBG: Recent Labs  Lab 05/28/20 0558 05/28/20 1102 05/28/20 1602 05/28/20 2105 05/29/20 0608  GLUCAP 157* 142* 169* 184* 174*       Signed:  Nita Sells MD   [facility] 05/29/2020, 10:07 AM

## 2020-05-29 NOTE — Discharge Instructions (Signed)

## 2020-05-29 NOTE — TOC Progression Note (Addendum)
Transition of Care [facility]) - Progression Note    Patient Details  Name: Kristin Cook MRN: [id number] Date of Birth: 05-20-1931  Transition of [facility]) CM/SW Contact  Nadene Rubins Adria Devon, RN Phone Number: 05/29/2020, 10:08 AM  Clinical Narrative:     Marylene Land with [facility] accepted referral. Jasmine December aware and would nurse to call her when patient is ready to discharge. Jasmine December will provide transportation home. Secure chatted nurse.  Expected Discharge Plan: Home w Home Health Services Barriers to Discharge: Continued Medical Work up  Expected Discharge Plan and Services Expected Discharge Plan: Home w Home Health Services   Discharge Planning Services: CM Consult Post Acute Care Choice: Home Health Living arrangements for the past 2 months: Single Family Home Expected Discharge Date: 05/29/20               DME Arranged: N/A         HH Arranged: PT           Social Determinants of Health (SDOH) Interventions    Readmission Risk Interventions No flowsheet data found.

## 2020-05-29 NOTE — Care Management Important Message (Signed)
Important Message  Patient Details  Name: Kristin Cook MRN: [id number] Date of Birth: 09-Dec-1931   Medicare Important Message Given:  Yes  Patient left prior to IM delivery.  IM mailedto the patient address.    [name] 05/29/2020, 4:16 PM

## 2020-05-31 ENCOUNTER — Telehealth: Payer: Self-pay | Admitting: Internal Medicine

## 2020-05-31 NOTE — Telephone Encounter (Signed)
Returned call to the pt's daughter, Jasmine December I advised her she is not on DPR and need permission from pt to speak with her  Spoke with the pt and she gave permission to speak with Lillia Pauls states pt recently d/c from hospital after PE  Her breathing has improved and she is wondering if still needs to keep PFT appt for 06/07/20 Please advise thanks

## 2020-05-31 NOTE — Telephone Encounter (Signed)
Called patient's daughter  Let her know Dr. Jane Canary recommendations.  Jasmine December voiced understanding Nothing further needed at this time.

## 2020-05-31 NOTE — Telephone Encounter (Signed)
Yes keep pft 06/07/20 unless he is not feeling well at that time

## 2020-06-04 ENCOUNTER — Telehealth: Payer: Self-pay

## 2020-06-04 ENCOUNTER — Other Ambulatory Visit (HOSPITAL_COMMUNITY): Payer: Medicare Other

## 2020-06-04 NOTE — Telephone Encounter (Signed)
Angie from [facility] called to advise pt was at the hospital and was sent home on P/t orders and daughter is refusing wanting nursing. Please advise if appt is needed before referral can be made. [place]  Fax [phone].

## 2020-06-07 ENCOUNTER — Ambulatory Visit: Payer: Medicare Other | Admitting: Internal Medicine

## 2020-06-14 ENCOUNTER — Other Ambulatory Visit: Payer: Self-pay

## 2020-06-14 ENCOUNTER — Telehealth: Payer: Self-pay

## 2020-06-14 DIAGNOSIS — H9113 Presbycusis, bilateral: Secondary | ICD-10-CM

## 2020-06-14 DIAGNOSIS — R0902 Hypoxemia: Secondary | ICD-10-CM

## 2020-06-14 DIAGNOSIS — G3184 Mild cognitive impairment, so stated: Secondary | ICD-10-CM

## 2020-06-14 NOTE — Telephone Encounter (Signed)
Referral put in [facility] 

## 2020-06-14 NOTE — Telephone Encounter (Signed)
Pt daughter asked for a nurse to come out with home health instead of P/T. Please advise if this is ok.  [facility]. [phone]. Pt was in hospital and this is were the referral was started.   Laurel Dimmer RMA

## 2020-06-14 NOTE — Telephone Encounter (Signed)
have home health come out and assess what her needs are.

## 2020-06-18 ENCOUNTER — Other Ambulatory Visit (HOSPITAL_COMMUNITY)
Admission: RE | Admit: 2020-06-18 | Discharge: 2020-06-18 | Disposition: A | Discharge status: Home / Self Care | Payer: Medicare Other | Source: Ambulatory Visit | Attending: Adult Health | Admitting: Adult Health

## 2020-06-18 ENCOUNTER — Other Ambulatory Visit: Payer: Self-pay | Admitting: Family Medicine

## 2020-06-18 DIAGNOSIS — Z20822 Contact with and (suspected) exposure to covid-19: Secondary | ICD-10-CM | POA: Insufficient documentation

## 2020-06-18 DIAGNOSIS — Z01812 Encounter for preprocedural laboratory examination: Secondary | ICD-10-CM | POA: Insufficient documentation

## 2020-06-18 DIAGNOSIS — E118 Type 2 diabetes mellitus with unspecified complications: Secondary | ICD-10-CM

## 2020-06-18 LAB — SARS CORONAVIRUS 2 (TAT 6-24 HRS): SARS Coronavirus 2: NEGATIVE

## 2020-06-20 ENCOUNTER — Ambulatory Visit: Payer: Medicare Other | Admitting: Adult Health

## 2020-06-20 ENCOUNTER — Ambulatory Visit: Payer: Medicare Other | Admitting: Internal Medicine

## 2020-06-20 ENCOUNTER — Other Ambulatory Visit: Payer: Self-pay

## 2020-06-20 ENCOUNTER — Encounter: Payer: Self-pay | Admitting: Adult Health

## 2020-06-20 VITALS — BP 112/78 | HR 94 | Temp 97.4°F | Ht 65.0 in | Wt 129.0 lb

## 2020-06-20 DIAGNOSIS — R0902 Hypoxemia: Secondary | ICD-10-CM | POA: Diagnosis not present

## 2020-06-20 DIAGNOSIS — Z8616 Personal history of COVID-19: Secondary | ICD-10-CM

## 2020-06-20 DIAGNOSIS — I2699 Other pulmonary embolism without acute cor pulmonale: Secondary | ICD-10-CM | POA: Insufficient documentation

## 2020-06-20 DIAGNOSIS — J849 Interstitial pulmonary disease, unspecified: Secondary | ICD-10-CM | POA: Diagnosis not present

## 2020-06-20 DIAGNOSIS — R0602 Shortness of breath: Secondary | ICD-10-CM | POA: Diagnosis not present

## 2020-06-20 DIAGNOSIS — I2782 Chronic pulmonary embolism: Secondary | ICD-10-CM

## 2020-06-20 DIAGNOSIS — I272 Pulmonary hypertension, unspecified: Secondary | ICD-10-CM | POA: Diagnosis not present

## 2020-06-20 LAB — BASIC METABOLIC PANEL
BUN: 20 mg/dL (ref 6–23)
CO2: 25 mEq/L (ref 19–32)
Calcium: 9.3 mg/dL (ref 8.4–10.5)
Chloride: 99 mEq/L (ref 96–112)
Creatinine, Ser: 0.87 mg/dL (ref 0.40–1.20)
GFR: 60.16 mL/min (ref >60.00)
Glucose, Bld: 205 mg/dL — ABNORMAL HIGH (ref 70–99)
Potassium: 4.2 mEq/L (ref 3.5–5.1)
Sodium: 134 mEq/L — ABNORMAL LOW (ref 135–145)

## 2020-06-20 LAB — COMPREHENSIVE METABOLIC PANEL
ALT: 9 U/L (ref 0–35)
AST: 12 U/L (ref 0–37)
Albumin: 3.6 g/dL (ref 3.5–5.2)
Alkaline Phosphatase: 66 U/L (ref 39–117)
BUN: 20 mg/dL (ref 6–23)
CO2: 25 mEq/L (ref 19–32)
Calcium: 9.3 mg/dL (ref 8.4–10.5)
Chloride: 99 mEq/L (ref 96–112)
Creatinine, Ser: 0.87 mg/dL (ref 0.40–1.20)
GFR: 60.16 mL/min (ref >60.00)
Glucose, Bld: 205 mg/dL — ABNORMAL HIGH (ref 70–99)
Potassium: 4.2 mEq/L (ref 3.5–5.1)
Sodium: 134 mEq/L — ABNORMAL LOW (ref 135–145)
Total Bilirubin: 0.4 mg/dL (ref 0.2–1.2)
Total Protein: 6.9 g/dL (ref 6.0–8.3)

## 2020-06-20 LAB — BRAIN NATRIURETIC PEPTIDE: Pro B Natriuretic peptide (BNP): 334 pg/mL — ABNORMAL HIGH (ref 0.0–100.0)

## 2020-06-20 NOTE — Patient Instructions (Addendum)
Begin Oxygen 2l/m with activity and At bedtime   Continue on Xarelto 20mg  daily  Labs today .  Continue on Lasix 20mg  daily .  Follow up with Dr. in 2 weeks and As needed   Please contact office for sooner follow up if symptoms do not improve or worsen or seek emergency care

## 2020-06-20 NOTE — Assessment & Plan Note (Signed)
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  Patient also had COVID-19 in December.  Patient's had a progressive clinical decline since then.  Has associated pulmonary hypertension along with probable chronic PE on recent CT angio.  Autoimmune and connective tissue labs were negative except for an elevated CCP at 130 For now we will continue on oxygen to maintain O2 saturations greater than 88 to 90%.  Manage volume overload. Goals of care discussion with patient family member. Patient does have moderate to advanced dementia  Plan  Patient Instructions  Begin Oxygen 2l/m with activity and At bedtime   Continue on Xarelto 20mg  daily  Labs today .  Continue on Lasix 20mg  daily .  Follow up with Dr. in 2 weeks and As needed   Please contact office for sooner follow up if symptoms do not improve or worsen or seek emergency care

## 2020-06-20 NOTE — Progress Notes (Addendum)
$'@Patient'M$  ID: Kristin Cook, female    DOB: [birth year], 87 y.o.   MRN: [id number]  Chief Complaint  Patient presents with  . Follow-up    Referring provider: [name], MD  HPI: 87 year old female seen for pulmonary consult April 19, 2020 for evaluation of shortness of breath, hypoxia and interstitial changes on CT scan.  TEST/EVENTS :  Autoimmune/CTD labs: Negative Sjogren's, scleroderma, hypersensitivity pneumonitis panel, rheumatoid factor, QuantiFERON gold negative, ANA negative, double-stranded DNA antibody negative CCP elevated 130  High-resolution CT chest April 27, 2020 showed moderate pulmonary fibrosis with an apical to basal gradient, septal thickening, traction bronchiectasis.  Without clear evidence of honeycombing.  Findings consistent with probable UIP.,  Left upper lobe nodule 8 mm  CT chest angio May 25, 2020 shows small web in the left lower lobe pulmonary artery indicating a remote or chronic PE but no acute PE is demonstrated.,  Bilateral interstitial accentuation with patchy groundglass opacities.  2D echo May 26, 2020 EF 65 to 27%, grade 1 diastolic dysfunction, right ventricle systolic function moderately reduced severely elevated pulmonary artery systolic pressure with PAP at 83 mmHg  06/20/2020 Follow up : PE, Post hospital , ILD  Patient returns for a follow-up visit.  Patient was seen in February for a pulmonary consult for ongoing shortness of breath and abnormal CT with interstitial changes. Patient had had COVID-19 infection in December 2021.  She is accompanied by her daughter-in-law who gives most of the information.  Patient does have dementia , poor historian.  Last visit patient was set up for high-resolution CT chest that showed moderate pulmonary fibrosis with findings consistent for probable UIP.  Patient had autoimmune and connective tissue labs that were essentially negative except for an elevated CCP at 130. According to the  daughter patient continued to have ongoing symptoms with shortness of breath.  Patient has been progressively declining since December.  Prior to December admission patient was living independently.  Now she is having to live with her daughter-in-law.  Activity level has declined.  Patient has shortness of breath with minimum activities.  She was admitted in mid March for acute respiratory failure with hypoxemia.  Found to have right-sided heart failure with indeterminate PE on CT scan.  CT angio showed a small web in the left lower lobe pulmonary artery indicating a remote or chronic PE but no acute PE was demonstrated.  Patient was started on Xarelto.  She did require IV diuresis.  And discharged on Lasix 20 mg.  Patient was initially treated with heparin and transition to [facility].  Venous Dopplers were negative for DVT.  Echo showed preserved EF.  Grade 1 diastolic dysfunction.  Severely reduced right ventricular systolic function and severe pulmonary hypertension with PAP at 83 mmHg. Since COVID in December patient's memory has progressively gotten worse.  Today in the office patient says she has no shortness of breath and feels well.   Conversation with daughter-in-law regarding goals of care.  There was mention in the discharge summary regarding palliative care.  Patient's family member will discuss with patient's son regarding goals of care.. Since discharge patient is doing about the same.  Today in office though O2 saturations remain low.  Walk test shows that O2 saturations drop into the 70s with ambulation.  She required 2 L of oxygen to maintain O2 saturations greater than 90%.  O2 sat 78% on room air , O2 sats 96% on 2l/m .    Allergies  Allergen Reactions  .  Penicillins Swelling  . Sulfa Antibiotics Swelling    Weak   . Sulfamethoxazole Other (See Comments)    Feels like her legs are weighted down    Immunization History  Administered Date(s) Administered  . DTaP 03/14/2002  . Fluad  Quad(high Dose 65+) 01/18/2020  . Influenza Split 01/12/2013, 12/11/2014  . Influenza Whole 01/21/2007, 11/17/2007, 11/05/2010  . Influenza, High Dose Seasonal PF 11/11/2013, 12/10/2015, 12/03/2016, 02/01/2018, 12/24/2018  . Influenza-Unspecified 12/03/2016  . PFIZER(Purple Top)SARS-COV-2 Vaccination 04/22/2019, 05/13/2019, 01/18/2020  . Pneumococcal Conjugate-13 07/01/2016  . Pneumococcal Polysaccharide-23 09/07/2006, 05/10/2013  . Tdap 08/05/2012  . Zoster 03/23/2007  . Zoster Recombinat (Shingrix) 07/16/2016, 06/21/2018    Past Medical History:  Diagnosis Date  . Arthritis   . Cataract   . Cystocele   . Dementia ([place])    forgetfulness  . Diabetes mellitus   . Dyslipidemia   . History of colonic polyps   . Hx MRSA infection   . Hyperlipidemia   . Hypertension   . Mental disorder   . Neuropathic pain of lower extremity   . OAB (overactive bladder)   . Shortness of breath    with exertion    Tobacco History: Social History   Tobacco Use  Smoking Status Former Smoker  . Packs/day: 0.30  . Years: 60.00  . Pack years: 18.00  . Types: Cigarettes  . Quit date: 02/12/2020  . Years since quitting: 0.3  Smokeless Tobacco Never Used   Counseling given: Not Answered   Outpatient Medications Prior to Visit  Medication Sig Dispense Refill  . acetaminophen (TYLENOL) 325 MG tablet Take 2 tablets (650 mg total) by mouth every 6 (six) hours as needed for mild pain (or Fever >/= 101).    Marland Kitchen albuterol (VENTOLIN HFA) 108 (90 Base) MCG/ACT inhaler Inhale 2 puffs into the lungs every 6 (six) hours as needed for wheezing or shortness of breath.    Marland Kitchen amLODipine (NORVASC) 10 MG tablet TAKE 1 TABLET(10 MG) BY MOUTH DAILY (Patient taking differently: Take 10 mg by mouth daily.) 90 tablet 3  . furosemide (LASIX) 20 MG tablet Take 1 tablet (20 mg total) by mouth daily. 30 tablet 0  . furosemide (LASIX) 20 MG tablet TAKE 1 TABLET (20 MG TOTAL) BY MOUTH DAILY. 30 tablet 0  . ONETOUCH ULTRA  test strip USE AS DIRECTED 100 strip 3  . pioglitazone-metformin (ACTOPLUS MET) 15-850 MG tablet TAKE 1 TABLET BY MOUTH TWICE DAILY WITH A MEAL 180 tablet 0  . polyethylene glycol (MIRALAX / GLYCOLAX) 17 g packet Take 17 g by mouth daily as needed for mild constipation. 14 each 0  . [START ON 06/26/2020] rivaroxaban (XARELTO) 20 MG TABS tablet Take 1 tablet (20 mg total) by mouth daily with supper. 30 tablet 1  . rivaroxaban (XARELTO) 20 MG TABS tablet TAKE 1 TABLET (20 MG TOTAL) BY MOUTH DAILY WITH SUPPER. 30 tablet 1  . RIVAROXABAN (XARELTO) VTE STARTER PACK (15 & 20 MG) Take one $Remove'15mg'PfYviCN$  tablet by mouth twice a day until 4/9. On 06/17/20, switch to one $Remo'20mg'Xtdrp$  tablet once a day. Take with food. 51 each 0  . RIVAROXABAN (XARELTO) VTE STARTER PACK (15 & 20 MG) TAKE 1 TABLET ($RemoveB'15MG'vSUanoFS$ ) BY MOUTH TWICE A DAY UNTIL 4/9. ON 06/17/20, SWITCH TO 1 TABLET ($RemoveB'20MG'neyPrypE$ ) BY MOUTH ONCE A DAY. TAKE WITH FOOD. (Patient taking differently: TAKE 1 TABLET ($RemoveB'15MG'UsMkhbEf$ ) BY MOUTH TWICE A DAY UNTIL 4/9. ON 06/17/20, SWITCH TO 1 TABLET ($RemoveB'20MG'gkERIJpp$ ) BY MOUTH ONCE A DAY. TAKE WITH FOOD.)  51 each 0   No facility-administered medications prior to visit.     Review of Systems:   Constitutional:   No  weight loss, night sweats,  Fevers, chills,  +fatigue, or  lassitude.  HEENT:   No headaches,  Difficulty swallowing,  Tooth/dental problems, or  Sore throat,                No sneezing, itching, ear ache, nasal congestion, post nasal drip,   CV:  No chest pain,  Orthopnea, PND, swelling in lower extremities, anasarca, dizziness, palpitations, syncope.   GI  No heartburn, indigestion, abdominal pain, nausea, vomiting, diarrhea, change in bowel habits, loss of appetite, bloody stools.   Resp:   No chest wall deformity  Skin: no rash or lesions.  GU: no dysuria, change in color of urine, no urgency or frequency.  No flank pain, no hematuria   MS:  No joint pain or swelling.  No decreased range of motion.  No back pain.    Physical Exam  BP  112/78 (BP Location: Left Arm, Cuff Size: Normal)   Pulse 94   Temp (!) 97.4 F (36.3 C)   Ht $R'5\' 5"'An$  (1.651 m)   Wt 129 lb (58.5 kg)   SpO2 96%   BMI 21.47 kg/m   GEN: A/Ox3; pleasant , NAD, flat affect, hard of hearing   HEENT:  [place]/AT, TMs-wnl, NOSE-clear, THROAT-clear, no lesions, no postnasal drip or exudate noted.   NECK:  Supple w/ fair ROM; no JVD; normal carotid impulses w/o bruits; no thyromegaly or nodules palpated; no lymphadenopathy.    RESP bibasilar crackles . no accessory muscle use, no dullness to percussion  CARD:  RRR, no m/r/g, 1+ peripheral edema, pulses intact, no cyanosis or clubbing.  GI:   Soft & nt; nml bowel sounds; no organomegaly or masses detected.   Musco: Warm bil, no deformities or joint swelling noted.   Neuro: alert, no focal deficits noted.    Skin: Warm, no lesions or rashes    Lab Results:  CBC    Component Value Date/Time   WBC 9.2 05/29/2020 0023   RBC 4.21 05/29/2020 0023   HGB 12.7 05/29/2020 0023   HGB 14.9 01/18/2020 1005   HCT 38.1 05/29/2020 0023   HCT 44.4 01/18/2020 1005   PLT 278 05/29/2020 0023   PLT 263 01/18/2020 1005   MCV 90.5 05/29/2020 0023   MCV 88 01/18/2020 1005   MCH 30.2 05/29/2020 0023   MCHC 33.3 05/29/2020 0023   RDW 14.0 05/29/2020 0023   RDW 11.8 01/18/2020 1005   LYMPHSABS 1.3 05/29/2020 0023   LYMPHSABS 0.9 01/18/2020 1005   MONOABS 1.3 (H) 05/29/2020 0023   EOSABS 0.1 05/29/2020 0023   EOSABS 0.1 01/18/2020 1005   BASOSABS 0.1 05/29/2020 0023   BASOSABS 0.1 01/18/2020 1005    BMET    Component Value Date/Time   NA 134 (L) 05/29/2020 0023   NA 137 01/18/2020 1005   K 4.1 05/29/2020 0023   CL 98 05/29/2020 0023   CO2 27 05/29/2020 0023   GLUCOSE 194 (H) 05/29/2020 0023   BUN 24 (H) 05/29/2020 0023   BUN 11 01/18/2020 1005   CREATININE 0.99 05/29/2020 0023   CREATININE 0.76 10/14/2016 0822   CALCIUM 9.0 05/29/2020 0023   GFRNONAA 56 (L) 05/29/2020 0023   GFRAA 86 01/18/2020 1005     BNP    Component Value Date/Time   BNP 332.2 (H) 05/25/2020 1508    ProBNP No results  found for: PROBNP  Imaging: DG Chest 2 View  Result Date: 05/28/2020 CLINICAL DATA:  Pneumonia. EXAM: CHEST - 2 VIEW COMPARISON:  May 25, 2020. FINDINGS: Stable cardiomegaly. No pneumothorax or pleural effusion is noted. Stable bilateral diffuse interstitial densities are noted which may represent chronic interstitial lung disease, although acute superimposed atypical inflammation cannot be excluded. Bony thorax is unremarkable. IMPRESSION: Stable bilateral diffuse interstitial densities are noted which may represent chronic interstitial lung disease, although acute superimposed atypical inflammation cannot be excluded. Aortic Atherosclerosis (ICD10-I70.0). Electronically Signed   By: Lupita Raider M.D.   On: 05/28/2020 08:14   CT Angio Chest PE W and/or Wo Contrast  Result Date: 05/25/2020 CLINICAL DATA:  Shortness of breath for 2 days.  Hypoxia. EXAM: CT ANGIOGRAPHY CHEST WITH CONTRAST TECHNIQUE: Multidetector CT imaging of the chest was performed using the standard protocol during bolus administration of intravenous contrast. Multiplanar CT image reconstructions and MIPs were obtained to evaluate the vascular anatomy. CONTRAST:  53mL OMNIPAQUE IOHEXOL 350 MG/ML SOLN COMPARISON:  Multiple exams, including 04/27/2020 FINDINGS: Cardiovascular: There is a small web in the left lower lobe pulmonary artery on images 141 through 157 of series 7 indicating remote/chronic pulmonary embolus, but no acute pulmonary embolus is demonstrated. Mild to moderate cardiomegaly. Mitral valve calcification. Coronary, aortic arch, and branch vessel atherosclerotic vascular disease. Mediastinum/Nodes: Subcarinal node 1.4 cm in short axis on image 49 series 6, previously 1.6 cm. Paratracheal node 0.9 cm in short axis on image 37 series 6, previously 0.8 cm. Prevascular node 0.9 cm in short axis on image 38 series 6, previously  0.8 cm. Lungs/Pleura: Bilateral interstitial accentuation with patchy ground-glass opacities. Although much of this background appearance is likely attributable to UIP, the ground-glass densities are increased from prior and fairly widespread, raising the possibility of a component of pulmonary edema, atypical pneumonia, acute hypersensitivity reaction, or acute eosinophilic pneumonia. Previous subpleural nodular area of concern in the left upper lobe is unchanged on image 46 of series 6, although somewhat obscured by surrounding additional opacities. Upper Abdomen: Unremarkable Musculoskeletal: Lower thoracic vertebral augmentation. Review of the MIP images confirms the above findings. IMPRESSION: 1. Small web in the left lower lobe pulmonary artery indicating remote/chronic pulmonary embolus, but no acute pulmonary embolus is demonstrated. 2. Bilateral interstitial accentuation with patchy ground-glass opacities. Although much of this background appearance is likely attributable to UIP, the ground-glass densities are increased from prior and fairly widespread, raising the possibility of a component of pulmonary edema, atypical pneumonia, acute hypersensitivity reaction, or acute eosinophilic pneumonia. 3. Mild to moderate cardiomegaly. Mitral valve calcification. 4. Coronary, aortic arch, and branch vessel atherosclerotic vascular disease. 5. The potential 8 mm nodule peripherally in the left upper lobe is unchanged over the past month. Original recommendation for 3 month follow up remains in effect. Aortic Atherosclerosis (ICD10-I70.0). Electronically Signed   By: Gaylyn Rong M.D.   On: 05/25/2020 20:57   DG Chest Portable 1 View  Result Date: 05/25/2020 CLINICAL DATA:  Shortness of breath for the past 2 days. EXAM: PORTABLE CHEST 1 VIEW COMPARISON:  CT chest dated April 27, 2020. FINDINGS: Unchanged borderline cardiomegaly. Similar appearing diffuse interstitial thickening. No focal consolidation,  pleural effusion, or pneumothorax. No acute osseous abnormality. Prior T12 cement augmentation. IMPRESSION: 1.  No active cardiopulmonary disease. 2. Unchanged chronic interstitial lung disease. Electronically Signed   By: Obie Dredge M.D.   On: 05/25/2020 16:36   ECHOCARDIOGRAM COMPLETE  Result Date: 05/26/2020    ECHOCARDIOGRAM REPORT  Patient Name:   SHERLEEN PANGBORN Date of Exam: 05/26/2020 Medical Rec #:  [id number]        Height:       65.0 in Accession #:    [id number]       Weight:       130.5 lb Date of Birth:  1931/08/27        BSA:          1.650 m Patient Age:    69 years         BP:           137/54 mmHg Patient Gender: F                HR:           90 bpm. Exam Location:  Inpatient Procedure: 2D Echo, Cardiac Doppler and Color Doppler STAT ECHO Indications:    Pulmonary embolus  History:        Patient has prior history of Echocardiogram examinations, most                 recent 02/13/2020. Risk Factors:Hypertension, Dyslipidemia,                 Diabetes and Former Smoker.  Sonographer:    Clayton Lefort RDCS (AE) Referring Phys: [phone] [place]  1. Left ventricular ejection fraction, by estimation, is 65 to 70%. The left ventricle has normal function. The left ventricle has no regional wall motion abnormalities. There is moderate concentric left ventricular hypertrophy. Left ventricular diastolic parameters are consistent with Grade I diastolic dysfunction (impaired relaxation). Elevated left ventricular end-diastolic pressure. There is the interventricular septum is flattened in systole, consistent with right ventricular pressure overload.  2. Right ventricular systolic function is moderately reduced. The right ventricular size is normal. There is severely elevated pulmonary artery systolic pressure. The estimated right ventricular systolic pressure is 97.9 mmHg.  3. The mitral valve is degenerative. No evidence of mitral valve regurgitation. Mild to moderate mitral stenosis.  The mean mitral valve gradient is 5.0 mmHg at 87bpm.  4. The aortic valve is normal in structure. Aortic valve regurgitation is not visualized. No aortic stenosis is present.  5. The inferior vena cava is normal in size with <50% respiratory variability, suggesting right atrial pressure of 8 mmHg.  6. Tricuspid valve regurgitation is mild to moderate. FINDINGS  Left Ventricle: Left ventricular ejection fraction, by estimation, is 65 to 70%. The left ventricle has normal function. The left ventricle has no regional wall motion abnormalities. The left ventricular internal cavity size was normal in size. There is  moderate concentric left ventricular hypertrophy. The interventricular septum is flattened in systole, consistent with right ventricular pressure overload. Left ventricular diastolic parameters are consistent with Grade I diastolic dysfunction (impaired  relaxation). Elevated left ventricular end-diastolic pressure. Right Ventricle: The right ventricular size is normal. No increase in right ventricular wall thickness. Right ventricular systolic function is moderately reduced. There is severely elevated pulmonary artery systolic pressure. The tricuspid regurgitant velocity is 4.33 m/s, and with an assumed right atrial pressure of 8 mmHg, the estimated right ventricular systolic pressure is 89.2 mmHg. Left Atrium: Left atrial size was normal in size. Right Atrium: Right atrial size was normal in size. Pericardium: There is no evidence of pericardial effusion. Mitral Valve: The mitral valve is degenerative in appearance. There is moderate thickening of the mitral valve leaflet(s). There is mild calcification of the mitral valve leaflet(s). Moderate mitral annular calcification. No  evidence of mitral valve regurgitation. Moderate mitral valve stenosis. MV peak gradient, 14.1 mmHg. The mean mitral valve gradient is 5.0 mmHg. Tricuspid Valve: The tricuspid valve is normal in structure. Tricuspid valve regurgitation  is mild to moderate. No evidence of tricuspid stenosis. Aortic Valve: The aortic valve is normal in structure. Aortic valve regurgitation is not visualized. No aortic stenosis is present. Aortic valve mean gradient measures 9.0 mmHg. Aortic valve peak gradient measures 18.1 mmHg. Aortic valve area, by VTI measures 1.55 cm. Pulmonic Valve: The pulmonic valve was normal in structure. Pulmonic valve regurgitation is trivial. No evidence of pulmonic stenosis. Aorta: The aortic root is normal in size and structure. Venous: The inferior vena cava is normal in size with less than 50% respiratory variability, suggesting right atrial pressure of 8 mmHg. IAS/Shunts: No atrial level shunt detected by color flow Doppler.  LEFT VENTRICLE PLAX 2D LVIDd:         3.20 cm  Diastology LVIDs:         2.50 cm  LV e' medial:    4.79 cm/s LV PW:         1.30 cm  LV E/e' medial:  19.7 LV IVS:        1.30 cm  LV e' lateral:   4.79 cm/s LVOT diam:     1.90 cm  LV E/e' lateral: 19.7 LV SV:         67 LV SV Index:   40 LVOT Area:     2.84 cm  RIGHT VENTRICLE            IVC RV Basal diam:  3.40 cm    IVC diam: 1.60 cm RV Mid diam:    3.50 cm RV S prime:     7.29 cm/s TAPSE (M-mode): 1.3 cm LEFT ATRIUM             Index       RIGHT ATRIUM           Index LA diam:        2.60 cm 1.58 cm/m  RA Area:     12.70 cm LA Vol (A2C):   51.6 ml 31.27 ml/m RA Volume:   29.00 ml  17.58 ml/m LA Vol (A4C):   47.1 ml 28.55 ml/m LA Biplane Vol: 52.8 ml 32.00 ml/m  AORTIC VALVE AV Area (Vmax):    1.77 cm AV Area (Vmean):   1.89 cm AV Area (VTI):     1.55 cm AV Vmax:           212.50 cm/s AV Vmean:          138.000 cm/s AV VTI:            0.430 m AV Peak Grad:      18.1 mmHg AV Mean Grad:      9.0 mmHg LVOT Vmax:         133.00 cm/s LVOT Vmean:        91.900 cm/s LVOT VTI:          0.235 m LVOT/AV VTI ratio: 0.55  AORTA Ao Root diam: 3.20 cm Ao Asc diam:  3.30 cm MITRAL VALVE                TRICUSPID VALVE MV Area (PHT): 1.89 cm     TR Peak grad:    75.0 mmHg MV Area VTI:   1.51 cm     TR Vmax:        433.00 cm/s MV  Peak grad:  14.1 mmHg MV Mean grad:  5.0 mmHg     SHUNTS MV Vmax:       1.88 m/s     Systemic VTI:  0.24 m MV Vmean:      97.5 cm/s    Systemic Diam: 1.90 cm MV Decel Time: 401 msec MV E velocity: 94.50 cm/s MV A velocity: 184.00 cm/s MV E/A ratio:  0.51 Fransico Him MD Electronically signed by Fransico Him MD Signature Date/Time: 05/26/2020/1:39:33 PM    Final    VAS [place] LOWER EXTREMITY VENOUS (DVT)  Result Date: 05/27/2020  Lower Venous DVT Study Indications: Edema.  Comparison Study: Prior negative study done 02/13/20 Performing Technologist: Sharion Dove RVS  Examination Guidelines: A complete evaluation includes B-mode imaging, spectral Doppler, color Doppler, and power Doppler as needed of all accessible portions of each vessel. Bilateral testing is considered an integral part of a complete examination. Limited examinations for reoccurring indications may be performed as noted. The reflux portion of the exam is performed with the patient in reverse Trendelenburg.  +---------+---------------+---------+-----------+----------+--------------+ RIGHT    CompressibilityPhasicitySpontaneityPropertiesThrombus Aging +---------+---------------+---------+-----------+----------+--------------+ CFV      Full           Yes      Yes                                 +---------+---------------+---------+-----------+----------+--------------+ SFJ      Full                                                        +---------+---------------+---------+-----------+----------+--------------+ FV Prox  Full                                                        +---------+---------------+---------+-----------+----------+--------------+ FV Mid   Full                                                        +---------+---------------+---------+-----------+----------+--------------+ FV DistalFull                    Yes                                  +---------+---------------+---------+-----------+----------+--------------+ PFV      Full                                                        +---------+---------------+---------+-----------+----------+--------------+ POP      Full           Yes      Yes                                 +---------+---------------+---------+-----------+----------+--------------+  PTV      Full                                                        +---------+---------------+---------+-----------+----------+--------------+ PERO     Full                                                        +---------+---------------+---------+-----------+----------+--------------+   +---------+---------------+---------+-----------+----------+--------------+ LEFT     CompressibilityPhasicitySpontaneityPropertiesThrombus Aging +---------+---------------+---------+-----------+----------+--------------+ CFV      Full           Yes      Yes                                 +---------+---------------+---------+-----------+----------+--------------+ SFJ      Full                                                        +---------+---------------+---------+-----------+----------+--------------+ FV Prox  Full                                                        +---------+---------------+---------+-----------+----------+--------------+ FV Mid   Full                                                        +---------+---------------+---------+-----------+----------+--------------+ FV DistalFull                                                        +---------+---------------+---------+-----------+----------+--------------+ PFV      Full                                                        +---------+---------------+---------+-----------+----------+--------------+ POP      Full           Yes      Yes                                  +---------+---------------+---------+-----------+----------+--------------+ PTV      Full                                                        +---------+---------------+---------+-----------+----------+--------------+  PERO     Full                                                        +---------+---------------+---------+-----------+----------+--------------+ Soleal   Full                                                        +---------+---------------+---------+-----------+----------+--------------+     Summary: RIGHT: - Findings appear essentially unchanged compared to previous examination. - There is no evidence of deep vein thrombosis in the lower extremity.  LEFT: - Findings appear essentially unchanged compared to previous examination. - There is no evidence of deep vein thrombosis in the lower extremity.  *See table(s) above for measurements and observations. Electronically signed by Ruta Hinds MD on 05/27/2020 at 11:12:16 AM.    Final       No flowsheet data found.  No results found for: NITRICOXIDE      Assessment & Plan:   ILD (interstitial lung disease) ([place]) Progress of ILD-with probable UIP on CT scan.  Patient also had COVID-19 in December.  Patient's had a progressive clinical decline since then.  Has associated pulmonary hypertension along with probable chronic PE on recent CT angio.  Autoimmune and connective tissue labs were negative except for an elevated CCP at 130 For now we will continue on oxygen to maintain O2 saturations greater than 88 to 90%.  Manage volume overload. Goals of care discussion with patient family member. Patient does have moderate to advanced dementia  Plan  Patient Instructions  Begin Oxygen 2l/m with activity and At bedtime   Continue on Xarelto $RemoveBe'20mg'HGoUdqvRY$  daily  Labs today .  Continue on Lasix $Remove'20mg'IwOyVpq$  daily .  Follow up with Dr. Chase Caller in 2 weeks and As needed   Please contact office for sooner follow up if symptoms do  not improve or worsen or seek emergency care       Hypoxia Progressive decline with an associated interstitial lung disease and recent diagnosis PE.  Severe pulmonary hypertension on 2D echo. Patient has exertional hypoxemia.  Will need oxygen 2 L with activity.  Most likely has nocturnal hypoxemia will begin oxygen at bedtime as well.  Plan  Patient Instructions  Begin Oxygen 2l/m with activity and At bedtime   Continue on Xarelto $RemoveBe'20mg'fRVrKEEXo$  daily  Labs today .  Continue on Lasix $Remove'20mg'ZYEYLod$  daily .  Follow up with Dr. Chase Caller in 2 weeks and As needed   Please contact office for sooner follow up if symptoms do not improve or worsen or seek emergency care      Pulmonary embolism [facility]) CT chest angio on May 25, 2020 showed probable remote/chronic PE.  Patient has been started on Xarelto.  She is continue on current dose.  Patient education given.  Lab work today. We will most likely need Xarelto 3 to 6 months.  Would repeat 2D echo in 3 months to determine how pulmonary artery pressures are at that time.  Pulmonary hypertension ([place]) Severely elevated pulmonary hypertension with decreased RVSF on echo. With probable chronic PE on CT.  Patient is continue on Xarelto.  Along with patient's interstitial lung disease, oxygen dependence now  and dementia -unclear if patient would be a right heart cath candidate.  For now would begin oxygen to maintain O2 saturations greater than 88 to 90%.  Manage volume overload continue on Lasix.  Lab work today.  Plan  Patient Instructions  Begin Oxygen 2l/m with activity and At bedtime   Continue on Xarelto $RemoveBe'20mg'rHeQKYZqy$  daily  Labs today .  Continue on Lasix $Remove'20mg'aIvioLP$  daily .  Follow up with Dr. Chase Caller in 2 weeks and As needed   Please contact office for sooner follow up if symptoms do not improve or worsen or seek emergency care    '      Laren Orama, NP 06/20/2020

## 2020-06-20 NOTE — Progress Notes (Signed)
Patient not able to perform. 

## 2020-06-20 NOTE — Patient Instructions (Signed)
Patient not able to perform. 

## 2020-06-20 NOTE — Assessment & Plan Note (Signed)
Progressive decline with an associated interstitial lung disease and recent diagnosis PE.  Severe pulmonary hypertension on 2D echo. Patient has exertional hypoxemia.  Will need oxygen 2 L with activity.  Most likely has nocturnal hypoxemia will begin oxygen at bedtime as well.  Plan  Patient Instructions  Begin Oxygen 2l/m with activity and At bedtime   Continue on Xarelto 20mg  daily  Labs today .  Continue on Lasix 20mg  daily .  Follow up with Dr. in 2 weeks and As needed   Please contact office for sooner follow up if symptoms do not improve or worsen or seek emergency care

## 2020-06-20 NOTE — Assessment & Plan Note (Signed)
Severely elevated pulmonary hypertension with decreased RVSF on echo. With probable chronic PE on CT.  Patient is continue on Xarelto.  Along with patient's interstitial lung disease, oxygen dependence now and dementia -unclear if patient would be a right heart cath candidate.  For now would begin oxygen to maintain O2 saturations greater than 88 to 90%.  Manage volume overload continue on Lasix.  Lab work today.  Plan  Patient Instructions  Begin Oxygen 2l/m with activity and At bedtime   Continue on Xarelto 20mg  daily  Labs today .  Continue on Lasix 20mg  daily .  Follow up with Dr. in 2 weeks and As needed   Please contact office for sooner follow up if symptoms do not improve or worsen or seek emergency care    '

## 2020-06-20 NOTE — Assessment & Plan Note (Signed)
CT chest angio on May 25, 2020 showed probable remote/chronic PE.  Patient has been started on Xarelto.  She is continue on current dose.  Patient education given.  Lab work today. We will most likely need Xarelto 3 to 6 months.  Would repeat 2D echo in 3 months to determine how pulmonary artery pressures are at that time.

## 2020-06-21 ENCOUNTER — Telehealth: Payer: Self-pay | Admitting: Internal Medicine

## 2020-06-21 ENCOUNTER — Ambulatory Visit: Payer: Medicare Other

## 2020-06-21 ENCOUNTER — Other Ambulatory Visit (HOSPITAL_COMMUNITY): Payer: Self-pay

## 2020-06-21 ENCOUNTER — Other Ambulatory Visit (INDEPENDENT_AMBULATORY_CARE_PROVIDER_SITE_OTHER): Payer: Medicare Other

## 2020-06-21 ENCOUNTER — Ambulatory Visit (INDEPENDENT_AMBULATORY_CARE_PROVIDER_SITE_OTHER): Payer: Medicare Other

## 2020-06-21 DIAGNOSIS — R0602 Shortness of breath: Secondary | ICD-10-CM

## 2020-06-21 DIAGNOSIS — I272 Pulmonary hypertension, unspecified: Secondary | ICD-10-CM | POA: Diagnosis not present

## 2020-06-21 DIAGNOSIS — R0902 Hypoxemia: Secondary | ICD-10-CM | POA: Diagnosis not present

## 2020-06-21 DIAGNOSIS — J849 Interstitial pulmonary disease, unspecified: Secondary | ICD-10-CM

## 2020-06-21 LAB — CBC WITH DIFFERENTIAL/PLATELET
Basophils Absolute: 0 10*3/uL (ref 0.0–0.1)
Basophils Relative: 0.4 % (ref 0.0–3.0)
Eosinophils Absolute: 0.1 10*3/uL (ref 0.0–0.7)
Eosinophils Relative: 0.8 % (ref 0.0–5.0)
HCT: 37.7 % (ref 36.0–46.0)
Hemoglobin: 12.5 g/dL (ref 12.0–15.0)
Lymphocytes Relative: 8.2 % — ABNORMAL LOW (ref 12.0–46.0)
Lymphs Abs: 0.8 10*3/uL (ref 0.7–4.0)
MCHC: 33.3 g/dL (ref 30.0–36.0)
MCV: 90 fl (ref 78.0–100.0)
Monocytes Absolute: 0.9 10*3/uL (ref 0.1–1.0)
Monocytes Relative: 8.7 % (ref 3.0–12.0)
Neutro Abs: 8.1 10*3/uL — ABNORMAL HIGH (ref 1.4–7.7)
Neutrophils Relative %: 81.9 % — ABNORMAL HIGH (ref 43.0–77.0)
Platelets: 233 10*3/uL (ref 150.0–400.0)
RBC: 4.19 Mil/uL (ref 3.87–5.11)
RDW: 14.7 % (ref 11.5–15.5)
WBC: 9.9 10*3/uL (ref 4.0–10.5)

## 2020-06-21 MED ORDER — AZITHROMYCIN 250 MG PO TABS: ORAL_TABLET | ORAL | 0 refills | Status: AC

## 2020-06-21 MED ORDER — FUROSEMIDE 20 MG PO TABS: ORAL_TABLET | ORAL | 2 refills | Status: DC

## 2020-06-21 NOTE — Progress Notes (Signed)
See telephone note from 06/21/2020 at 11:38. Spoke with the pt's daughter in law, who stated she already had lab results given to her from someone at [facility] office. Nothing further needed at this time.

## 2020-06-21 NOTE — Telephone Encounter (Signed)
Pt daughter returning missed [phone]

## 2020-06-21 NOTE — Telephone Encounter (Signed)
[facility] 06/21/20

## 2020-06-21 NOTE — Telephone Encounter (Signed)
Spoke with the pt's daughter in law  Pt with dementia  Informed her to have DPR done next visit  Gave results of labs/cxr  Aware Zpack was sent  o2 to be delivered today- they called and spoke with the daughter in law

## 2020-06-21 NOTE — Addendum Note (Signed)
Addended by: Julio Sicks on: 06/21/2020 09:13 AM   Modules accepted: Orders

## 2020-06-21 NOTE — Telephone Encounter (Signed)
Refill was sent to pharmacy for pt today 4/14. Called and spoke with pt's daughter Jasmine December letting her know that TP did sent Rx for lasix to pharmacy for pt and she said that she has been able to get that.  Jasmine December wanted to know if we had the results of the cxr that was done today 4/14.  Went to imaging tab and see info in the cxr about the recent BNP labwork but nothing documented about the cxr.  Tammy, please advise.

## 2020-06-21 NOTE — Telephone Encounter (Signed)
Yes please let her know that the lab work and x-ray are back.  Please see my result notes.  There is possible increased fluid and possible pneumonia.  Would recommend continue on the Lasix as we discussed.  And add in a Z-Pak.  If patient is not improving or or worsens she will need to be admitted again.  Please make sure that her oxygen has been delivered.  I have sent in the Z-Pak.  Please contact office for sooner follow up if symptoms do not improve or worsen or seek emergency care

## 2020-06-30 ENCOUNTER — Inpatient Hospital Stay (HOSPITAL_COMMUNITY)
Admission: EM | Admit: 2020-06-30 | Discharge: 2020-07-04 | DRG: 291 | Disposition: A | Discharge status: Hospice | Payer: Medicare Other | Attending: Internal Medicine | Admitting: Internal Medicine

## 2020-06-30 ENCOUNTER — Emergency Department (HOSPITAL_COMMUNITY): Payer: Medicare Other

## 2020-06-30 ENCOUNTER — Other Ambulatory Visit: Payer: Self-pay

## 2020-06-30 ENCOUNTER — Encounter (HOSPITAL_COMMUNITY): Payer: Self-pay

## 2020-06-30 DIAGNOSIS — R54 Age-related physical debility: Secondary | ICD-10-CM | POA: Diagnosis present

## 2020-06-30 DIAGNOSIS — E873 Alkalosis: Secondary | ICD-10-CM | POA: Diagnosis present

## 2020-06-30 DIAGNOSIS — Z87891 Personal history of nicotine dependence: Secondary | ICD-10-CM | POA: Diagnosis not present

## 2020-06-30 DIAGNOSIS — J84112 Idiopathic pulmonary fibrosis: Secondary | ICD-10-CM | POA: Diagnosis not present

## 2020-06-30 DIAGNOSIS — J849 Interstitial pulmonary disease, unspecified: Secondary | ICD-10-CM | POA: Diagnosis not present

## 2020-06-30 DIAGNOSIS — Z743 Need for continuous supervision: Secondary | ICD-10-CM | POA: Diagnosis not present

## 2020-06-30 DIAGNOSIS — I499 Cardiac arrhythmia, unspecified: Secondary | ICD-10-CM | POA: Diagnosis not present

## 2020-06-30 DIAGNOSIS — R000 Tachycardia, unspecified: Secondary | ICD-10-CM | POA: Diagnosis not present

## 2020-06-30 DIAGNOSIS — Z7901 Long term (current) use of anticoagulants: Secondary | ICD-10-CM

## 2020-06-30 DIAGNOSIS — N179 Acute kidney failure, unspecified: Secondary | ICD-10-CM | POA: Diagnosis present

## 2020-06-30 DIAGNOSIS — Z8616 Personal history of COVID-19: Secondary | ICD-10-CM

## 2020-06-30 DIAGNOSIS — Z7401 Bed confinement status: Secondary | ICD-10-CM | POA: Diagnosis not present

## 2020-06-30 DIAGNOSIS — R0689 Other abnormalities of breathing: Secondary | ICD-10-CM | POA: Diagnosis not present

## 2020-06-30 DIAGNOSIS — Z833 Family history of diabetes mellitus: Secondary | ICD-10-CM

## 2020-06-30 DIAGNOSIS — E872 Acidosis: Secondary | ICD-10-CM | POA: Diagnosis present

## 2020-06-30 DIAGNOSIS — R739 Hyperglycemia, unspecified: Secondary | ICD-10-CM | POA: Diagnosis not present

## 2020-06-30 DIAGNOSIS — M1990 Unspecified osteoarthritis, unspecified site: Secondary | ICD-10-CM | POA: Diagnosis present

## 2020-06-30 DIAGNOSIS — G3109 Other frontotemporal dementia: Secondary | ICD-10-CM | POA: Diagnosis not present

## 2020-06-30 DIAGNOSIS — M2550 Pain in unspecified joint: Secondary | ICD-10-CM | POA: Diagnosis not present

## 2020-06-30 DIAGNOSIS — I2699 Other pulmonary embolism without acute cor pulmonale: Secondary | ICD-10-CM | POA: Diagnosis present

## 2020-06-30 DIAGNOSIS — R0602 Shortness of breath: Secondary | ICD-10-CM | POA: Diagnosis not present

## 2020-06-30 DIAGNOSIS — Z66 Do not resuscitate: Secondary | ICD-10-CM | POA: Diagnosis not present

## 2020-06-30 DIAGNOSIS — Z880 Allergy status to penicillin: Secondary | ICD-10-CM | POA: Diagnosis not present

## 2020-06-30 DIAGNOSIS — E1165 Type 2 diabetes mellitus with hyperglycemia: Secondary | ICD-10-CM | POA: Diagnosis present

## 2020-06-30 DIAGNOSIS — I2782 Chronic pulmonary embolism: Secondary | ICD-10-CM | POA: Diagnosis present

## 2020-06-30 DIAGNOSIS — Z882 Allergy status to sulfonamides status: Secondary | ICD-10-CM

## 2020-06-30 DIAGNOSIS — I5033 Acute on chronic diastolic (congestive) heart failure: Secondary | ICD-10-CM | POA: Diagnosis not present

## 2020-06-30 DIAGNOSIS — J9601 Acute respiratory failure with hypoxia: Secondary | ICD-10-CM

## 2020-06-30 DIAGNOSIS — E785 Hyperlipidemia, unspecified: Secondary | ICD-10-CM | POA: Diagnosis present

## 2020-06-30 DIAGNOSIS — F0390 Unspecified dementia without behavioral disturbance: Secondary | ICD-10-CM | POA: Diagnosis present

## 2020-06-30 DIAGNOSIS — I272 Pulmonary hypertension, unspecified: Secondary | ICD-10-CM | POA: Diagnosis present

## 2020-06-30 DIAGNOSIS — I2510 Atherosclerotic heart disease of native coronary artery without angina pectoris: Secondary | ICD-10-CM | POA: Diagnosis not present

## 2020-06-30 DIAGNOSIS — Z515 Encounter for palliative care: Secondary | ICD-10-CM

## 2020-06-30 DIAGNOSIS — R6889 Other general symptoms and signs: Secondary | ICD-10-CM | POA: Diagnosis not present

## 2020-06-30 DIAGNOSIS — Z8614 Personal history of Methicillin resistant Staphylococcus aureus infection: Secondary | ICD-10-CM

## 2020-06-30 DIAGNOSIS — J9690 Respiratory failure, unspecified, unspecified whether with hypoxia or hypercapnia: Secondary | ICD-10-CM | POA: Diagnosis present

## 2020-06-30 DIAGNOSIS — J9621 Acute and chronic respiratory failure with hypoxia: Secondary | ICD-10-CM | POA: Diagnosis present

## 2020-06-30 DIAGNOSIS — J90 Pleural effusion, not elsewhere classified: Secondary | ICD-10-CM | POA: Diagnosis not present

## 2020-06-30 DIAGNOSIS — Z90710 Acquired absence of both cervix and uterus: Secondary | ICD-10-CM

## 2020-06-30 DIAGNOSIS — I10 Essential (primary) hypertension: Secondary | ICD-10-CM | POA: Diagnosis present

## 2020-06-30 DIAGNOSIS — E1140 Type 2 diabetes mellitus with diabetic neuropathy, unspecified: Secondary | ICD-10-CM | POA: Diagnosis present

## 2020-06-30 DIAGNOSIS — I110 Hypertensive heart disease with heart failure: Principal | ICD-10-CM | POA: Diagnosis present

## 2020-06-30 DIAGNOSIS — Z20822 Contact with and (suspected) exposure to covid-19: Secondary | ICD-10-CM | POA: Diagnosis not present

## 2020-06-30 DIAGNOSIS — F0280 Dementia in other diseases classified elsewhere without behavioral disturbance: Secondary | ICD-10-CM | POA: Diagnosis not present

## 2020-06-30 DIAGNOSIS — Z79899 Other long term (current) drug therapy: Secondary | ICD-10-CM | POA: Diagnosis not present

## 2020-06-30 DIAGNOSIS — J984 Other disorders of lung: Secondary | ICD-10-CM | POA: Diagnosis not present

## 2020-06-30 DIAGNOSIS — R404 Transient alteration of awareness: Secondary | ICD-10-CM | POA: Diagnosis not present

## 2020-06-30 DIAGNOSIS — E118 Type 2 diabetes mellitus with unspecified complications: Secondary | ICD-10-CM

## 2020-06-30 DIAGNOSIS — J8410 Pulmonary fibrosis, unspecified: Secondary | ICD-10-CM | POA: Diagnosis not present

## 2020-06-30 LAB — CBC WITH DIFFERENTIAL/PLATELET
Abs Immature Granulocytes: 0.25 10*3/uL — ABNORMAL HIGH (ref 0.00–0.07)
Basophils Absolute: 0.1 10*3/uL (ref 0.0–0.1)
Basophils Relative: 1 %
Eosinophils Absolute: 0 10*3/uL (ref 0.0–0.5)
Eosinophils Relative: 0 %
HCT: 41.8 % (ref 36.0–46.0)
Hemoglobin: 12.6 g/dL (ref 12.0–15.0)
Immature Granulocytes: 2 %
Lymphocytes Relative: 11 %
Lymphs Abs: 1.3 10*3/uL (ref 0.7–4.0)
MCH: 29.4 pg (ref 26.0–34.0)
MCHC: 30.1 g/dL (ref 30.0–36.0)
MCV: 97.7 fL (ref 80.0–100.0)
Monocytes Absolute: 1.4 10*3/uL — ABNORMAL HIGH (ref 0.1–1.0)
Monocytes Relative: 11 %
Neutro Abs: 9.6 10*3/uL — ABNORMAL HIGH (ref 1.7–7.7)
Neutrophils Relative %: 75 %
Platelets: 436 10*3/uL — ABNORMAL HIGH (ref 150–400)
RBC: 4.28 MIL/uL (ref 3.87–5.11)
RDW: 15 % (ref 11.5–15.5)
WBC: 12.6 10*3/uL — ABNORMAL HIGH (ref 4.0–10.5)
nRBC: 0.7 % — ABNORMAL HIGH (ref 0.0–0.2)

## 2020-06-30 LAB — LACTIC ACID, PLASMA: Lactic Acid, Venous: 6.5 mmol/L (ref 0.5–1.9)

## 2020-06-30 LAB — I-STAT ARTERIAL BLOOD GAS, ED
Acid-base deficit: 5 mmol/L — ABNORMAL HIGH (ref 0.0–2.0)
Bicarbonate: 18.2 mmol/L — ABNORMAL LOW (ref 20.0–28.0)
Calcium, Ion: 1.15 mmol/L (ref 1.15–1.40)
HCT: 34 % — ABNORMAL LOW (ref 36.0–46.0)
Hemoglobin: 11.6 g/dL — ABNORMAL LOW (ref 12.0–15.0)
O2 Saturation: 96 %
Patient temperature: 97.6
Potassium: 5 mmol/L (ref 3.5–5.1)
Sodium: 136 mmol/L (ref 135–145)
TCO2: 19 mmol/L — ABNORMAL LOW (ref 22–32)
pCO2 arterial: 28.6 mmHg — ABNORMAL LOW (ref 32.0–48.0)
pH, Arterial: 7.411 (ref 7.350–7.450)
pO2, Arterial: 79 mmHg — ABNORMAL LOW (ref 83.0–108.0)

## 2020-06-30 LAB — BASIC METABOLIC PANEL
Anion gap: 19 — ABNORMAL HIGH (ref 5–15)
BUN: 40 mg/dL — ABNORMAL HIGH (ref 8–23)
CO2: 13 mmol/L — ABNORMAL LOW (ref 22–32)
Calcium: 8.8 mg/dL — ABNORMAL LOW (ref 8.9–10.3)
Chloride: 104 mmol/L (ref 98–111)
Creatinine, Ser: 1.29 mg/dL — ABNORMAL HIGH (ref 0.44–1.00)
GFR, Estimated: 40 mL/min — ABNORMAL LOW (ref >60)
Glucose, Bld: 317 mg/dL — ABNORMAL HIGH (ref 70–99)
Potassium: 5.6 mmol/L — ABNORMAL HIGH (ref 3.5–5.1)
Sodium: 136 mmol/L (ref 135–145)

## 2020-06-30 LAB — TROPONIN I (HIGH SENSITIVITY): Troponin I (High Sensitivity): 25 ng/L — ABNORMAL HIGH (ref <18)

## 2020-06-30 LAB — BRAIN NATRIURETIC PEPTIDE: B Natriuretic Peptide: 817.7 pg/mL — ABNORMAL HIGH (ref 0.0–100.0)

## 2020-06-30 MED ORDER — SODIUM CHLORIDE 0.9 % IV BOLUS
500.0000 mL | Freq: Once | INTRAVENOUS | Status: AC
  Administered 2020-06-30: 500 mL via INTRAVENOUS

## 2020-06-30 MED ORDER — INSULIN ASPART 100 UNIT/ML ~~LOC~~ SOLN
8.0000 [IU] | Freq: Once | SUBCUTANEOUS | Status: AC
  Administered 2020-06-30: 8 [IU] via SUBCUTANEOUS

## 2020-06-30 MED ORDER — SODIUM CHLORIDE 0.9 % IV BOLUS: 1000.0000 mL | Freq: Once | INTRAVENOUS | Status: DC

## 2020-06-30 NOTE — ED Provider Notes (Addendum)
[facility] EMERGENCY DEPARTMENT Provider Note   CSN: [id number] Arrival date & time: 06/30/20  2159     History Chief Complaint  Patient presents with  . Respiratory Distress    Kristin Cook is a 87 y.o. female.  With history of CHF dementia, presents with shortness of breath ongoing for 3 days worsening today.  She was sent home on 2 L nasal cannula by her primary care doctor, however symptoms worsened and he called EMS.  Upon EMS arrival they report patient satting about 60 to 70% on her 2 L.  They placed her on CPAP and brought to the ER.  Gave her 3 nitroglycerin tablets prior to arrival.  Patient also noted bilateral leg swelling unknown how long this has been going on.  No reports of fevers or cough.  Patient denies any chest pain.        Past Medical History:  Diagnosis Date  . Arthritis   . Cataract   . Cystocele   . Dementia ([place])    forgetfulness  . Diabetes mellitus   . Dyslipidemia   . History of colonic polyps   . Hx MRSA infection   . Hyperlipidemia   . Hypertension   . Mental disorder   . Neuropathic pain of lower extremity   . OAB (overactive bladder)   . Shortness of breath    with exertion    Patient Active Problem List   Diagnosis Date Noted  . Respiratory failure ([place]) 07/01/2020  . Hyperglycemia   . ILD (interstitial lung disease) ([place]) 06/20/2020  . Pulmonary embolism ([place]) 06/20/2020  . Pulmonary hypertension ([place]) 06/20/2020  . Hyponatremia 05/26/2020  . Dementia ([place]) 05/26/2020  . HLD (hyperlipidemia) 05/26/2020  . Acute hypoxemic respiratory failure ([place]) 05/25/2020  . Compression fracture of lumbar spine, non-traumatic, initial encounter ([place]) 02/12/2020  . Hypoxia 02/12/2020  . COVID-19 virus infection 02/12/2020  . Mild cognitive impairment with memory loss 09/21/2018  . Onychomycosis of multiple toenails with type 2 diabetes mellitus ([place]) 10/22/2015  . Presbycusis of both ears 06/19/2015  . Severe  nonproliferative diabetic retinopathy of left eye with macular edema ([place]) 12/30/2011  . Arthritis 09/29/2011  . Controlled diabetes mellitus type 2 with complications ([place]) 81/85/6314  . HTN (hypertension) 11/05/2010  . Hyperlipidemia associated with type 2 diabetes mellitus ([place]) 11/05/2010    Past Surgical History:  Procedure Laterality Date  . ABDOMINAL HYSTERECTOMY    . EYE SURGERY Bilateral    Cataract  . IR KYPHO LUMBAR INC FX REDUCE BONE BX UNI/BIL CANNULATION INC/IMAGING  02/14/2020  . PARS PLANA VITRECTOMY Left 03/15/2013   Procedure: PARS PLANA VITRECTOMY WITH 25 GAUGE LEFT EYE with Endolaser;  Surgeon: Hurman Horn, MD;  Location: [place];  Service: Ophthalmology;  Laterality: Left;  Marland Kitchen VAGINAL PROLAPSE REPAIR  2007   Dr. Gaetano Net     OB History   No obstetric history on file.     Family History  Problem Relation Age of Onset  . Healthy Mother   . Diabetes Father   . Hypertension Neg Hx     Social History   Tobacco Use  . Smoking status: Former Smoker    Packs/day: 0.30    Years: 60.00    Pack years: 18.00    Types: Cigarettes    Quit date: 02/12/2020    Years since quitting: 0.3  . Smokeless tobacco: Never Used  Vaping Use  . Vaping Use: Never used  Substance Use Topics  . Alcohol  use: No  . Drug use: No    Home Medications Prior to Admission medications   Medication Sig Start Date End Date Taking? Authorizing Provider  acetaminophen (TYLENOL) 325 MG tablet Take 2 tablets (650 mg total) by mouth every 6 (six) hours as needed for mild pain (or Fever >/= 101). 02/17/20   Ghimire, Henreitta Leber, MD  albuterol (VENTOLIN HFA) 108 (90 Base) MCG/ACT inhaler Inhale 2 puffs into the lungs every 6 (six) hours as needed for wheezing or shortness of breath. 02/17/20   Ghimire, Henreitta Leber, MD  amLODipine (NORVASC) 10 MG tablet TAKE 1 TABLET(10 MG) BY MOUTH DAILY Patient taking differently: Take 10 mg by mouth daily. 09/13/19   [name], MD  furosemide (LASIX) 20 MG  tablet 1-2 tabs daily 06/21/20   Parrett, Fonnie Mu, NP  [facility] ULTRA test strip USE AS DIRECTED 06/27/19   [name], MD  pioglitazone-metformin (ACTOPLUS MET) [phone] MG tablet TAKE 1 TABLET BY MOUTH TWICE DAILY WITH A MEAL 06/18/20   [name], MD  polyethylene glycol (MIRALAX / GLYCOLAX) 17 g packet Take 17 g by mouth daily as needed for mild constipation. 02/17/20   Ghimire, Henreitta Leber, MD  rivaroxaban (XARELTO) 20 MG TABS tablet Take 1 tablet (20 mg total) by mouth daily with supper. 06/26/20 07/26/20  Nita Sells, MD  rivaroxaban (XARELTO) 20 MG TABS tablet TAKE 1 TABLET (20 MG TOTAL) BY MOUTH DAILY WITH SUPPER. 05/29/20 05/29/21  Nita Sells, MD  RIVAROXABAN [employer]) VTE STARTER PACK (15 & 20 MG) Take one 41m tablet by mouth twice a day until 4/9. On 06/17/20, switch to one 28mtablet once a day. Take with food. 05/28/20   SaNita SellsMD  RIVAROXABAN (XAlveda ReasonsVTE STARTER PACK (15 & 20 MG) TAKE 1 TABLET (15MG) BY MOUTH TWICE A DAY UNTIL 4/9. ON 06/17/20, SWITCH TO 1 TABLET (20MG) BY MOUTH ONCE A DAY. TAKE WITH FOOD. Patient taking differently: TAKE 1 TABLET (15MG) BY MOUTH TWICE A DAY UNTIL 4/9. ON 06/17/20, SWITCH TO 1 TABLET (20MG) BY MOUTH ONCE A DAY. TAKE WITH FOOD. 05/28/20 05/28/21  SaNita SellsMD    Allergies    Penicillins, Sulfa antibiotics, and Sulfamethoxazole  Review of Systems   Review of Systems  Unable to perform ROS: Dementia    Physical Exam Updated Vital Signs BP 112/71   Pulse 86   Temp 98 F (36.7 C) (Axillary)   Resp (!) 25   Ht '5\' 5"'  (1.651 m)   Wt 58.8 kg   SpO2 100%   BMI 21.57 kg/m   Physical Exam Constitutional:      General: She is in acute distress.     Appearance: Normal appearance. She is ill-appearing.  HENT:     Head: Normocephalic.     Nose: Nose normal.  Eyes:     Extraocular Movements: Extraocular movements intact.  Cardiovascular:     Rate and Rhythm: Normal rate.  Pulmonary:     Effort:  Respiratory distress present.     Breath sounds: No wheezing.  Abdominal:     General: Abdomen is flat.     Tenderness: There is no abdominal tenderness.  Musculoskeletal:        General: Normal range of motion.     Cervical back: Normal range of motion.     Right lower leg: Edema present.     Left lower leg: Edema present.  Neurological:     General: No focal deficit present.  Mental Status: She is alert. Mental status is at baseline.     ED Results / Procedures / Treatments   Labs (all labs ordered are listed, but only abnormal results are displayed) Labs Reviewed  CBC WITH DIFFERENTIAL/PLATELET - Abnormal; Notable for the following components:      Result Value   WBC 12.6 (*)    Platelets 436 (*)    nRBC 0.7 (*)    Neutro Abs 9.6 (*)    Monocytes Absolute 1.4 (*)    Abs Immature Granulocytes 0.25 (*)    All other components within normal limits  BASIC METABOLIC PANEL - Abnormal; Notable for the following components:   Potassium 5.6 (*)    CO2 13 (*)    Glucose, Bld 317 (*)    BUN 40 (*)    Creatinine, Ser 1.29 (*)    Calcium 8.8 (*)    GFR, Estimated 40 (*)    Anion gap 19 (*)    All other components within normal limits  BRAIN NATRIURETIC PEPTIDE - Abnormal; Notable for the following components:   B Natriuretic Peptide 817.7 (*)    All other components within normal limits  LACTIC ACID, PLASMA - Abnormal; Notable for the following components:   Lactic Acid, Venous 6.5 (*)    All other components within normal limits  D-DIMER, QUANTITATIVE - Abnormal; Notable for the following components:   D-Dimer, Quant 1.12 (*)    All other components within normal limits  CBC - Abnormal; Notable for the following components:   WBC 12.9 (*)    RBC 3.86 (*)    Hemoglobin 11.5 (*)    HCT 35.7 (*)    nRBC 0.6 (*)    All other components within normal limits  BASIC METABOLIC PANEL - Abnormal; Notable for the following components:   CO2 21 (*)    BUN 41 (*)    Creatinine,  Ser 1.22 (*)    GFR, Estimated 43 (*)    All other components within normal limits  BLOOD GAS, VENOUS - Abnormal; Notable for the following components:   pCO2, Ven 38.7 (*)    pO2, Ven 62.7 (*)    Acid-base deficit 2.9 (*)    All other components within normal limits  LACTIC ACID, PLASMA - Abnormal; Notable for the following components:   Lactic Acid, Venous 3.6 (*)    All other components within normal limits  URINALYSIS, ROUTINE W REFLEX MICROSCOPIC - Abnormal; Notable for the following components:   Color, Urine AMBER (*)    APPearance HAZY (*)    Glucose, UA 50 (*)    Protein, ur 100 (*)    Bacteria, UA MANY (*)    All other components within normal limits  GLUCOSE, CAPILLARY - Abnormal; Notable for the following components:   Glucose-Capillary 144 (*)    All other components within normal limits  GLUCOSE, CAPILLARY - Abnormal; Notable for the following components:   Glucose-Capillary 122 (*)    All other components within normal limits  GLUCOSE, CAPILLARY - Abnormal; Notable for the following components:   Glucose-Capillary 104 (*)    All other components within normal limits  I-STAT ARTERIAL BLOOD GAS, ED - Abnormal; Notable for the following components:   pCO2 arterial 28.6 (*)    pO2, Arterial 79 (*)    Bicarbonate 18.2 (*)    TCO2 19 (*)    Acid-base deficit 5.0 (*)    HCT 34.0 (*)    Hemoglobin 11.6 (*)    All  other components within normal limits  TROPONIN I (HIGH SENSITIVITY) - Abnormal; Notable for the following components:   Troponin I (High Sensitivity) 25 (*)    All other components within normal limits  CULTURE, BLOOD (ROUTINE X 2)  CULTURE, BLOOD (ROUTINE X 2)  RESP PANEL BY RT-PCR (FLU A&B, COVID) ARPGX2  MRSA PCR SCREENING  MAGNESIUM  PHOSPHORUS  PROCALCITONIN  BETA-HYDROXYBUTYRIC ACID  GLUCOSE, CAPILLARY  GLUCOSE, CAPILLARY  GLUCOSE, CAPILLARY  GLUCOSE, CAPILLARY  GLUCOSE, CAPILLARY  BLOOD GAS, ARTERIAL  TROPONIN I (HIGH SENSITIVITY)     EKG EKG Interpretation  Date/Time:  Saturday June 30 2020 22:04:19 EDT Ventricular Rate:  113 PR Interval:  150 QRS Duration: 162 QT Interval:  329 QTC Calculation: 452 R Axis:   208 Text Interpretation: Sinus tachycardia Ventricular premature complex Right bundle branch block Anterolateral infarct, old Artifact in lead(s) I II III aVR aVL aVF V1 V3 V4 V5 V6 Confirmed by Thamas Jaegers ([id number]) on 06/30/2020 10:46:00 PM   Radiology CT Chest Wo Contrast  Result Date: 07/01/2020 CLINICAL DATA:  Respiratory illness and nondiagnostic chest x-ray EXAM: CT CHEST WITHOUT CONTRAST TECHNIQUE: Multidetector CT imaging of the chest was performed following the standard protocol without IV contrast. COMPARISON:  05/25/2020 FINDINGS: Cardiovascular: Cardiomegaly. No pericardial effusion. Aortic and coronary atherosclerosis. Mediastinum/Nodes: Reactive appearing mediastinal nodal prominence. Lungs/Pleura: Chronic lung disease based on prior. There is acute patchy ground-glass and consolidative opacities with a new and moderate right pleural effusion. Chronic interlobular septal thickening and fibrotic changes. No pneumothorax Upper Abdomen: Negative Musculoskeletal: No acute finding. Remote T12 compression fracture and cement augmentation. Body wall edema. IMPRESSION: Acute on chronic airspace disease with body wall edema and right pleural effusion favoring pulmonary edema with atypical appearance due to underlying interstitial lung disease. Atypical infection or acute alveolitis should also be considered. Electronically Signed   By: Monte Fantasia M.D.   On: 07/01/2020 09:09   DG Chest Port 1 View  Result Date: 06/30/2020 CLINICAL DATA:  Respiratory distress and shortness of breath for 3 days. EXAM: PORTABLE CHEST 1 VIEW COMPARISON:  06/21/2020 FINDINGS: Heart size and pulmonary vascularity are normal for technique. Diffuse reticulonodular interstitial pattern to both lungs is similar to previous study.  Changes likely represent chronic interstitial lung disease. No pleural effusions. No pneumothorax. Mediastinal contours appear intact. Calcification of the aorta. Vertebral kyphoplasty at T12. IMPRESSION: Chronic interstitial lung disease. Electronically Signed   By: Lucienne Capers M.D.   On: 06/30/2020 22:39    Procedures .Critical Care E&M Performed by: Luna Fuse, MD  Critical care provider statement:    Critical care time (minutes):  30   Critical care time was exclusive of:  Separately billable procedures and treating other patients   Critical care was necessary to treat or prevent imminent or life-threatening deterioration of the following conditions:  Respiratory failure After initial E/M assessment, critical care services were subsequently performed that were exclusive of separately billable procedures or treatment.       Medications Ordered in ED Medications  polyethylene glycol (MIRALAX / GLYCOLAX) packet 17 g (has no administration in time range)  rivaroxaban (XARELTO) tablet 20 mg (has no administration in time range)  albuterol (VENTOLIN HFA) 108 (90 Base) MCG/ACT inhaler 2 puff (has no administration in time range)  docusate sodium (COLACE) capsule 100 mg (has no administration in time range)  insulin aspart (novoLOG) injection 0-15 Units (0 Units Subcutaneous Not Given 07/01/20 1219)  Chlorhexidine Gluconate Cloth 2 % PADS 6 each (6 each Topical  Given 07/01/20 0130)  chlorhexidine (PERIDEX) 0.12 % solution 15 mL (15 mLs Mouth Rinse Given 07/01/20 1030)  MEDLINE mouth rinse (15 mLs Mouth Rinse Given 07/01/20 1229)  dextrose 50 % solution 50 mL (has no administration in time range)  sodium chloride 0.9 % bolus 500 mL (500 mLs Intravenous New Bag/Given 06/30/20 2341)  insulin aspart (novoLOG) injection 8 Units (8 Units Subcutaneous Given 06/30/20 2342)  furosemide (LASIX) injection 40 mg (40 mg Intravenous Given 07/01/20 0458)  dextrose 50 % solution (50 mLs  Given 07/01/20  0500)  furosemide (LASIX) injection 80 mg (80 mg Intravenous Given 07/01/20 0955)  magnesium sulfate IVPB 2 g 50 mL (2 g Intravenous New Bag/Given 07/01/20 1224)    ED Course  I have reviewed the triage vital signs and the nursing notes.  Pertinent labs & imaging results that were available during my care of the patient were reviewed by me and considered in my medical decision making (see chart for details).    MDM Rules/Calculators/A&P                          On arrival to the ER patient was transitioned to to BiPAP here in the ER O2 saturation about 94 to 95% on BiPAP which is appropriate.  Blood pressure approximately 191 systolic.  Labs and imaging studies show elevated lactic acidosis.  There is an anion gap bicarb of 13 glucose is also elevated but the pH is 7.41.  Case discussed with critical care physicians will accept the patient for admission.  Agreeable to plan for IV fluid hydration subcu insulin, foregoing insulin drip for now and Noncon CT scanning of the chest pending.  Addendum: Did.  This was discussed with critical care physician.  Sepsis was considered, however blood pressure remained stable she is afebrile, and there are other possible causes of the lactic acidosis perhaps due to metformin use which would explain her metabolic acidosis with respiratory compensation perhaps.  Decision made to give the patient a 500 cc trial of IV fluid hydration given severe respiratory failure with patient known history of CHF.  Final Clinical Impression(s) / ED Diagnoses Final diagnoses:  Acute respiratory failure with hypoxia ([place])  Hyperglycemia    Rx / DC Orders ED Discharge Orders    None       Luna Fuse, MD 06/30/20 2230    Luna Fuse, MD 06/30/20 2329    Luna Fuse, MD 07/01/20 [phone]

## 2020-06-30 NOTE — ED Notes (Signed)
X-ray at bedside

## 2020-06-30 NOTE — ED Triage Notes (Signed)
Per [facility], pt coming from home reporting sob/ distress for 3 days. With ems pt spo2 60s on 2liters . Hx of dementia and chf. Rhales noted in all fields. Cpap spo2 88%. 150/90 hr 100, 3 nitro given in route. Pt family requesting DNR / DNI with ems. 22g in left hand. Pt has taken lasix as prescribed.

## 2020-06-30 NOTE — ED Notes (Signed)
Respiratory at bedside for abg

## 2020-07-01 ENCOUNTER — Inpatient Hospital Stay (HOSPITAL_COMMUNITY): Payer: Medicare Other

## 2020-07-01 DIAGNOSIS — J984 Other disorders of lung: Secondary | ICD-10-CM | POA: Diagnosis not present

## 2020-07-01 DIAGNOSIS — J9601 Acute respiratory failure with hypoxia: Secondary | ICD-10-CM

## 2020-07-01 DIAGNOSIS — I272 Pulmonary hypertension, unspecified: Secondary | ICD-10-CM

## 2020-07-01 DIAGNOSIS — J9690 Respiratory failure, unspecified, unspecified whether with hypoxia or hypercapnia: Secondary | ICD-10-CM | POA: Diagnosis present

## 2020-07-01 DIAGNOSIS — J90 Pleural effusion, not elsewhere classified: Secondary | ICD-10-CM | POA: Diagnosis not present

## 2020-07-01 DIAGNOSIS — E872 Acidosis: Secondary | ICD-10-CM | POA: Diagnosis present

## 2020-07-01 DIAGNOSIS — I5033 Acute on chronic diastolic (congestive) heart failure: Secondary | ICD-10-CM | POA: Diagnosis present

## 2020-07-01 DIAGNOSIS — J9621 Acute and chronic respiratory failure with hypoxia: Secondary | ICD-10-CM | POA: Diagnosis not present

## 2020-07-01 DIAGNOSIS — F0390 Unspecified dementia without behavioral disturbance: Secondary | ICD-10-CM | POA: Diagnosis present

## 2020-07-01 DIAGNOSIS — N179 Acute kidney failure, unspecified: Secondary | ICD-10-CM | POA: Diagnosis present

## 2020-07-01 DIAGNOSIS — Z87891 Personal history of nicotine dependence: Secondary | ICD-10-CM | POA: Diagnosis not present

## 2020-07-01 DIAGNOSIS — J84112 Idiopathic pulmonary fibrosis: Secondary | ICD-10-CM | POA: Diagnosis present

## 2020-07-01 DIAGNOSIS — F0280 Dementia in other diseases classified elsewhere without behavioral disturbance: Secondary | ICD-10-CM | POA: Diagnosis not present

## 2020-07-01 DIAGNOSIS — Z66 Do not resuscitate: Secondary | ICD-10-CM | POA: Diagnosis not present

## 2020-07-01 DIAGNOSIS — I2510 Atherosclerotic heart disease of native coronary artery without angina pectoris: Secondary | ICD-10-CM | POA: Diagnosis not present

## 2020-07-01 DIAGNOSIS — Z8614 Personal history of Methicillin resistant Staphylococcus aureus infection: Secondary | ICD-10-CM | POA: Diagnosis not present

## 2020-07-01 DIAGNOSIS — J8410 Pulmonary fibrosis, unspecified: Secondary | ICD-10-CM | POA: Diagnosis not present

## 2020-07-01 DIAGNOSIS — G3109 Other frontotemporal dementia: Secondary | ICD-10-CM | POA: Diagnosis not present

## 2020-07-01 DIAGNOSIS — E1165 Type 2 diabetes mellitus with hyperglycemia: Secondary | ICD-10-CM | POA: Diagnosis present

## 2020-07-01 DIAGNOSIS — R739 Hyperglycemia, unspecified: Secondary | ICD-10-CM | POA: Diagnosis not present

## 2020-07-01 DIAGNOSIS — Z880 Allergy status to penicillin: Secondary | ICD-10-CM | POA: Diagnosis not present

## 2020-07-01 DIAGNOSIS — Z20822 Contact with and (suspected) exposure to covid-19: Secondary | ICD-10-CM | POA: Diagnosis present

## 2020-07-01 DIAGNOSIS — M1990 Unspecified osteoarthritis, unspecified site: Secondary | ICD-10-CM | POA: Diagnosis present

## 2020-07-01 DIAGNOSIS — E785 Hyperlipidemia, unspecified: Secondary | ICD-10-CM | POA: Diagnosis present

## 2020-07-01 DIAGNOSIS — J849 Interstitial pulmonary disease, unspecified: Secondary | ICD-10-CM | POA: Diagnosis not present

## 2020-07-01 DIAGNOSIS — Z7901 Long term (current) use of anticoagulants: Secondary | ICD-10-CM | POA: Diagnosis not present

## 2020-07-01 DIAGNOSIS — Z79899 Other long term (current) drug therapy: Secondary | ICD-10-CM | POA: Diagnosis not present

## 2020-07-01 DIAGNOSIS — Z90710 Acquired absence of both cervix and uterus: Secondary | ICD-10-CM | POA: Diagnosis not present

## 2020-07-01 DIAGNOSIS — Z8616 Personal history of COVID-19: Secondary | ICD-10-CM | POA: Diagnosis not present

## 2020-07-01 DIAGNOSIS — I110 Hypertensive heart disease with heart failure: Secondary | ICD-10-CM | POA: Diagnosis present

## 2020-07-01 DIAGNOSIS — E873 Alkalosis: Secondary | ICD-10-CM | POA: Diagnosis present

## 2020-07-01 DIAGNOSIS — R54 Age-related physical debility: Secondary | ICD-10-CM | POA: Diagnosis present

## 2020-07-01 DIAGNOSIS — I2782 Chronic pulmonary embolism: Secondary | ICD-10-CM

## 2020-07-01 DIAGNOSIS — E1140 Type 2 diabetes mellitus with diabetic neuropathy, unspecified: Secondary | ICD-10-CM | POA: Diagnosis present

## 2020-07-01 LAB — GLUCOSE, CAPILLARY
Glucose-Capillary: 104 mg/dL — ABNORMAL HIGH (ref 70–99)
Glucose-Capillary: 122 mg/dL — ABNORMAL HIGH (ref 70–99)
Glucose-Capillary: 144 mg/dL — ABNORMAL HIGH (ref 70–99)
Glucose-Capillary: 71 mg/dL (ref 70–99)
Glucose-Capillary: 74 mg/dL (ref 70–99)
Glucose-Capillary: 77 mg/dL (ref 70–99)
Glucose-Capillary: 80 mg/dL (ref 70–99)
Glucose-Capillary: 82 mg/dL (ref 70–99)
Glucose-Capillary: 85 mg/dL (ref 70–99)
Glucose-Capillary: 99 mg/dL (ref 70–99)

## 2020-07-01 LAB — CBC
HCT: 35.7 % — ABNORMAL LOW (ref 36.0–46.0)
Hemoglobin: 11.5 g/dL — ABNORMAL LOW (ref 12.0–15.0)
MCH: 29.8 pg (ref 26.0–34.0)
MCHC: 32.2 g/dL (ref 30.0–36.0)
MCV: 92.5 fL (ref 80.0–100.0)
Platelets: 325 10*3/uL (ref 150–400)
RBC: 3.86 MIL/uL — ABNORMAL LOW (ref 3.87–5.11)
RDW: 15 % (ref 11.5–15.5)
WBC: 12.9 10*3/uL — ABNORMAL HIGH (ref 4.0–10.5)
nRBC: 0.6 % — ABNORMAL HIGH (ref 0.0–0.2)

## 2020-07-01 LAB — URINALYSIS, ROUTINE W REFLEX MICROSCOPIC
Bilirubin Urine: NEGATIVE
Glucose, UA: 50 mg/dL — AB
Hgb urine dipstick: NEGATIVE
Ketones, ur: NEGATIVE mg/dL
Leukocytes,Ua: NEGATIVE
Nitrite: NEGATIVE
Protein, ur: 100 mg/dL — AB
Specific Gravity, Urine: 1.018 (ref 1.005–1.030)
pH: 5 (ref 5.0–8.0)

## 2020-07-01 LAB — BLOOD GAS, VENOUS
Acid-base deficit: 2.9 mmol/L — ABNORMAL HIGH (ref 0.0–2.0)
Bicarbonate: 21.6 mmol/L (ref 20.0–28.0)
Drawn by: 5694
FIO2: 50
O2 Saturation: 88.2 %
Patient temperature: 37
pCO2, Ven: 38.7 mmHg — ABNORMAL LOW (ref 44.0–60.0)
pH, Ven: 7.365 (ref 7.250–7.430)
pO2, Ven: 62.7 mmHg — ABNORMAL HIGH (ref 32.0–45.0)

## 2020-07-01 LAB — BASIC METABOLIC PANEL
Anion gap: 12 (ref 5–15)
BUN: 41 mg/dL — ABNORMAL HIGH (ref 8–23)
CO2: 21 mmol/L — ABNORMAL LOW (ref 22–32)
Calcium: 8.9 mg/dL (ref 8.9–10.3)
Chloride: 106 mmol/L (ref 98–111)
Creatinine, Ser: 1.22 mg/dL — ABNORMAL HIGH (ref 0.44–1.00)
GFR, Estimated: 43 mL/min — ABNORMAL LOW (ref >60)
Glucose, Bld: 71 mg/dL (ref 70–99)
Potassium: 4.6 mmol/L (ref 3.5–5.1)
Sodium: 139 mmol/L (ref 135–145)

## 2020-07-01 LAB — TROPONIN I (HIGH SENSITIVITY): Troponin I (High Sensitivity): 12 ng/L (ref <18)

## 2020-07-01 LAB — MRSA PCR SCREENING: MRSA by PCR: NEGATIVE

## 2020-07-01 LAB — MAGNESIUM: Magnesium: 1.7 mg/dL (ref 1.7–2.4)

## 2020-07-01 LAB — D-DIMER, QUANTITATIVE: D-Dimer, Quant: 1.12 ug/mL-FEU — ABNORMAL HIGH (ref 0.00–0.50)

## 2020-07-01 LAB — RESP PANEL BY RT-PCR (FLU A&B, COVID) ARPGX2
Influenza A by PCR: NEGATIVE
Influenza B by PCR: NEGATIVE
SARS Coronavirus 2 by RT PCR: NEGATIVE

## 2020-07-01 LAB — LACTIC ACID, PLASMA: Lactic Acid, Venous: 3.6 mmol/L (ref 0.5–1.9)

## 2020-07-01 LAB — PHOSPHORUS: Phosphorus: 3.5 mg/dL (ref 2.5–4.6)

## 2020-07-01 LAB — BETA-HYDROXYBUTYRIC ACID: Beta-Hydroxybutyric Acid: 0.13 mmol/L (ref 0.05–0.27)

## 2020-07-01 LAB — PROCALCITONIN: Procalcitonin: 0.87 ng/mL

## 2020-07-01 MED ORDER — ORAL CARE MOUTH RINSE
15.0000 mL | Freq: Two times a day (BID) | OROMUCOSAL | Status: DC
  Administered 2020-07-01 – 2020-07-03 (×6): 15 mL via OROMUCOSAL

## 2020-07-01 MED ORDER — FUROSEMIDE 10 MG/ML IJ SOLN
80.0000 mg | Freq: Once | INTRAMUSCULAR | Status: AC
  Administered 2020-07-01: 80 mg via INTRAVENOUS
  Filled 2020-07-01: qty 8

## 2020-07-01 MED ORDER — FUROSEMIDE 10 MG/ML IJ SOLN
40.0000 mg | Freq: Once | INTRAMUSCULAR | Status: AC
  Administered 2020-07-01: 40 mg via INTRAVENOUS
  Filled 2020-07-01: qty 4

## 2020-07-01 MED ORDER — DEXTROSE 50 % IV SOLN
INTRAVENOUS | Status: AC
  Administered 2020-07-01: 50 mL
  Filled 2020-07-01: qty 50

## 2020-07-01 MED ORDER — ALBUTEROL SULFATE HFA 108 (90 BASE) MCG/ACT IN AERS
2.0000 | INHALATION_SPRAY | Freq: Four times a day (QID) | RESPIRATORY_TRACT | Status: DC | PRN
  Filled 2020-07-01: qty 6.7

## 2020-07-01 MED ORDER — POLYETHYLENE GLYCOL 3350 17 G PO PACK: 17.0000 g | PACK | Freq: Every day | ORAL | Status: DC | PRN

## 2020-07-01 MED ORDER — RIVAROXABAN 20 MG PO TABS
20.0000 mg | ORAL_TABLET | Freq: Every day | ORAL | Status: DC
  Administered 2020-07-01 – 2020-07-03 (×3): 20 mg via ORAL
  Filled 2020-07-01 (×3): qty 1

## 2020-07-01 MED ORDER — CHLORHEXIDINE GLUCONATE CLOTH 2 % EX PADS
6.0000 | MEDICATED_PAD | Freq: Every day | CUTANEOUS | Status: DC
  Administered 2020-07-01 – 2020-07-04 (×3): 6 via TOPICAL

## 2020-07-01 MED ORDER — INSULIN GLARGINE 100 UNIT/ML ~~LOC~~ SOLN
10.0000 [IU] | Freq: Every day | SUBCUTANEOUS | Status: DC
  Administered 2020-07-01: 10 [IU] via SUBCUTANEOUS
  Filled 2020-07-01 (×2): qty 0.1

## 2020-07-01 MED ORDER — DEXTROSE 50 % IV SOLN: 1.0000 | Freq: Once | INTRAVENOUS | Status: DC

## 2020-07-01 MED ORDER — MAGNESIUM SULFATE 2 GM/50ML IV SOLN
2.0000 g | Freq: Once | INTRAVENOUS | Status: AC
  Administered 2020-07-01: 2 g via INTRAVENOUS
  Filled 2020-07-01: qty 50

## 2020-07-01 MED ORDER — DOCUSATE SODIUM 100 MG PO CAPS: 100.0000 mg | ORAL_CAPSULE | Freq: Two times a day (BID) | ORAL | Status: DC | PRN

## 2020-07-01 MED ORDER — INSULIN ASPART 100 UNIT/ML ~~LOC~~ SOLN
0.0000 [IU] | SUBCUTANEOUS | Status: DC
  Administered 2020-07-01: 2 [IU] via SUBCUTANEOUS
  Administered 2020-07-01: 3 [IU] via SUBCUTANEOUS
  Administered 2020-07-02 (×2): 2 [IU] via SUBCUTANEOUS
  Administered 2020-07-02: 5 [IU] via SUBCUTANEOUS
  Administered 2020-07-03: 3 [IU] via SUBCUTANEOUS
  Administered 2020-07-03: 11 [IU] via SUBCUTANEOUS
  Administered 2020-07-03: 3 [IU] via SUBCUTANEOUS
  Administered 2020-07-03: 2 [IU] via SUBCUTANEOUS

## 2020-07-01 MED ORDER — CHLORHEXIDINE GLUCONATE 0.12 % MT SOLN
15.0000 mL | Freq: Two times a day (BID) | OROMUCOSAL | Status: DC
  Administered 2020-07-01 – 2020-07-04 (×6): 15 mL via OROMUCOSAL
  Filled 2020-07-01 (×4): qty 15

## 2020-07-01 NOTE — Progress Notes (Signed)
Pt transferred to 2W12 accompanied by RN and on telemetry and oxygen. Pt stable. Pt's phone is inside pt's black bag and transported with her. Pt left with 2W RN.

## 2020-07-01 NOTE — Progress Notes (Signed)
Patient transported from ED Trauma Room A to 2M04 on NIV Servo I with no complications.

## 2020-07-01 NOTE — Progress Notes (Signed)
Upon skin assessment , there is some redness and moisture to the sacrum area, foam pad placed on bottom

## 2020-07-01 NOTE — H&P (Signed)
NAME:  Kristin Cook, MRN:  [id number], DOB:  10/10/1931, LOS: 0 ADMISSION DATE:  06/30/2020, CONSULTATION DATE:  07/01/20 REFERRING MD:  EDP, CHIEF COMPLAINT:  Shortness of breath  History of Present Illness:  Kristin Cook is a 87 y.o. F with PMH of dementia, IUP, likely pulmonary HTN, HL, subacute PE on xarelto and Covid-19 infection in 02/2020 who presents with acute on chronic dyspnea that has been worse over the last several days per family member at the bedside.  Daughter in law reports that she collapsed in the chair when trying to walk across the room with increased cough.  She has been afebrile and been taking Lasix as prescribed.  Palliative care consult was discussed at last pulmonology office visit and family would like to pursue this.   Per EMS report, pt was saturating in the 60% on 2L.  In the ED, pt was placed on bipap with improvement.  CXR consistent with chronic lung disease.  Labs showed lactic acid of 6, WBC 12k, glucose 317, creatinine 1.2, BNP 817.  PCCM consulted for admission, family confirms DNR/DNI  Pertinent  Medical History   has a past medical history of Arthritis, Cataract, Cystocele, Dementia ([place]), Diabetes mellitus, Dyslipidemia, History of colonic polyps, MRSA infection, Hyperlipidemia, Hypertension, Mental disorder, Neuropathic pain of lower extremity, OAB (overactive bladder), and Shortness of breath.   Significant Hospital Events: Including procedures, antibiotic start and stop dates in addition to other pertinent events   . 4/24 admit to PCCM  Interim History / Subjective:  Pt is on bipap and hemodynamically stable  Objective   Blood pressure 138/87, pulse 97, temperature 97.6 F (36.4 C), temperature source Temporal, resp. rate (!) 34, height '5\' 5"'  (1.651 m), weight 58 kg, SpO2 97 %.    Vent Mode: BIPAP;PCV FiO2 (%):  [50 %] 50 % Set Rate:  [10 bmp] 10 bmp PEEP:  [6 cmH20] 6 cmH20 Plateau Pressure:  [18 cmH20] 18 cmH20  No intake or output data  in the 24 hours ending 07/01/20 0036 Filed Weights   06/30/20 2203  Weight: 58 kg    General:  Elderly, chronically ill-appearing F in no acute distress HEENT: MM pink/moist, sclera anicteric  Neuro: awake, answering questions, moves all extremities  CV: s1s2 rrr, no m/r/g PULM:  Course bilaterally, good oxygen sats on 40% GI: soft, bsx4 active  Extremities: warm/dry, 4+ edema bilaterally  Skin: no rashes or lesions   Labs/imaging that I havepersonally reviewed  (right click and "Reselect all SmartList Selections" daily)  CBC BMP CXR BNP  Resolved Hospital Problem list     Assessment & Plan:   Acute on Chronic Hypoxic Respiratory Failure In the setting of chronic lung disease, likely IUP, pulmonary HTN, chronic PE on Xarelto  P: -suspect chronic worsening of the above with acute volume overload -Lasix 63m x1 -family very amenable to palliative consult, confirm DNR/DNI but would like to continue aggressive care, consult palliative  -Saturating well on Bipap, check repeat VBG and consider transition to HFNC for comfort -last echo one month ago, EF 637-04% grade 1 diastolic, severe RV moderately reduced, severely elevated pulmonary artery pressure -though lactic acid elevated, suspect this may be secondary to metformin use, she is afebrile with minimally elevated WBC, check procalcitonin -low suspicion acute PE as she has been taking Xarelto, CT scan ordered in ED -continue Xarelto  Lactic acidosis Suspect this may be secondary to metformin use  P: -trend lactic acid and BMP, if shows signs of sepsis would  start empiric antibiotics -check UA   AGMA Suspect secondary to lactic acidosis, compensated P: -trend BMP -in the setting of hyperglycemia check beta hydroxybutyrate    Type 2 DM with hyperglycemia On Metformin P: -hold Metformin, start SSI and Lantus -beta hydroxybutyrate   Best practice (right click and "Reselect all SmartList Selections" daily)  Diet:   NPO Pain/Anxiety/Delirium protocol (if indicated): No VAP protocol (if indicated): Not indicated DVT prophylaxis: Systemic AC GI prophylaxis: N/A Glucose control:  SSI Yes Central venous access:  N/A Arterial line:  N/A Foley:  N/A Mobility:  bed rest  PT consulted: N/A Last date of multidisciplinary goals of care discussion [pending] Code Status:  DNR Disposition: ICU  Labs   CBC: Recent Labs  Lab 06/30/20 2214 06/30/20 2304  WBC 12.6*  --   NEUTROABS 9.6*  --   HGB 12.6 11.6*  HCT 41.8 34.0*  MCV 97.7  --   PLT 436*  --     Basic Metabolic Panel: Recent Labs  Lab 06/30/20 2214 06/30/20 2304  NA 136 136  K 5.6* 5.0  CL 104  --   CO2 13*  --   GLUCOSE 317*  --   BUN 40*  --   CREATININE 1.29*  --   CALCIUM 8.8*  --    GFR: Estimated Creatinine Clearance: 28.2 mL/min (A) (by C-G formula based on SCr of 1.29 mg/dL (H)). Recent Labs  Lab 06/30/20 2214 06/30/20 2242  WBC 12.6*  --   LATICACIDVEN  --  6.5*    Liver Function Tests: No results for input(s): AST, ALT, ALKPHOS, BILITOT, PROT, ALBUMIN in the last 168 hours. No results for input(s): LIPASE, AMYLASE in the last 168 hours. No results for input(s): AMMONIA in the last 168 hours.  ABG    Component Value Date/Time   PHART 7.411 06/30/2020 2304   PCO2ART 28.6 (L) 06/30/2020 2304   PO2ART 79 (L) 06/30/2020 2304   HCO3 18.2 (L) 06/30/2020 2304   TCO2 19 (L) 06/30/2020 2304   ACIDBASEDEF 5.0 (H) 06/30/2020 2304   O2SAT 96.0 06/30/2020 2304     Coagulation Profile: No results for input(s): INR, PROTIME in the last 168 hours.  Cardiac Enzymes: No results for input(s): CKTOTAL, CKMB, CKMBINDEX, TROPONINI in the last 168 hours.  HbA1C: Hemoglobin A1C  Date/Time Value Ref Range Status  01/18/2020 10:09 AM 9.0 (A) 4.0 - 5.6 % Final  09/13/2019 12:50 PM 8.1 (A) 4.0 - 5.6 % Final   Hgb A1c MFr Bld  Date/Time Value Ref Range Status  05/26/2020 06:58 AM 8.0 (H) 4.8 - 5.6 % Final    Comment:     (NOTE) Pre diabetes:          5.7%-6.4%  Diabetes:              >6.4%  Glycemic control for   <7.0% adults with diabetes     CBG: No results for input(s): GLUCAP in the last 168 hours.  Review of Systems:   Unable to obtain secondary to mental status and Bipap  Past Medical History:  She,  has a past medical history of Arthritis, Cataract, Cystocele, Dementia ([place]), Diabetes mellitus, Dyslipidemia, History of colonic polyps, MRSA infection, Hyperlipidemia, Hypertension, Mental disorder, Neuropathic pain of lower extremity, OAB (overactive bladder), and Shortness of breath.   Surgical History:   Past Surgical History:  Procedure Laterality Date  . ABDOMINAL HYSTERECTOMY    . EYE SURGERY Bilateral    Cataract  . IR [place]  FX REDUCE BONE BX UNI/BIL CANNULATION INC/IMAGING  02/14/2020  . PARS PLANA VITRECTOMY Left 03/15/2013   Procedure: PARS PLANA VITRECTOMY WITH 25 GAUGE LEFT EYE with Endolaser;  Surgeon: Hurman Horn, MD;  Location: [facility];  Service: Ophthalmology;  Laterality: Left;  Marland Kitchen VAGINAL PROLAPSE REPAIR  2007   Dr. Gaetano Net     Social History:   reports that she quit smoking about 4 months ago. Her smoking use included cigarettes. She has a 18.00 pack-year smoking history. She has never used smokeless tobacco. She reports that she does not drink alcohol and does not use drugs.   Family History:  Her family history includes Diabetes in her father; Healthy in her mother. There is no history of Hypertension.   Allergies Allergies  Allergen Reactions  . Penicillins Swelling  . Sulfa Antibiotics Swelling    Weak   . Sulfamethoxazole Other (See Comments)    Feels like her legs are weighted down     Home Medications  Prior to Admission medications   Medication Sig Start Date End Date Taking? Authorizing Provider  acetaminophen (TYLENOL) 325 MG tablet Take 2 tablets (650 mg total) by mouth every 6 (six) hours as needed for mild pain (or Fever >/= 101). 02/17/20    Ghimire, Henreitta Leber, MD  albuterol (VENTOLIN HFA) 108 (90 Base) MCG/ACT inhaler Inhale 2 puffs into the lungs every 6 (six) hours as needed for wheezing or shortness of breath. 02/17/20   Ghimire, Henreitta Leber, MD  amLODipine (NORVASC) 10 MG tablet TAKE 1 TABLET(10 MG) BY MOUTH DAILY Patient taking differently: Take 10 mg by mouth daily. 09/13/19   [name], MD  furosemide (LASIX) 20 MG tablet 1-2 tabs daily 06/21/20   Parrett, Fonnie Mu, NP  [facility] ULTRA test strip USE AS DIRECTED 06/27/19   [name], MD  pioglitazone-metformin (ACTOPLUS MET) [phone] MG tablet TAKE 1 TABLET BY MOUTH TWICE DAILY WITH A MEAL 06/18/20   [name], MD  polyethylene glycol (MIRALAX / GLYCOLAX) 17 g packet Take 17 g by mouth daily as needed for mild constipation. 02/17/20   Ghimire, Henreitta Leber, MD  rivaroxaban (XARELTO) 20 MG TABS tablet Take 1 tablet (20 mg total) by mouth daily with supper. 06/26/20 07/26/20  Nita Sells, MD  rivaroxaban (XARELTO) 20 MG TABS tablet TAKE 1 TABLET (20 MG TOTAL) BY MOUTH DAILY WITH SUPPER. 05/29/20 05/29/21  Nita Sells, MD  RIVAROXABAN [employer]) VTE STARTER PACK (15 & 20 MG) Take one 12m tablet by mouth twice a day until 4/9. On 06/17/20, switch to one 26mtablet once a day. Take with food. 05/28/20   SaNita SellsMD  RIVAROXABAN (XAlveda ReasonsVTE STARTER PACK (15 & 20 MG) TAKE 1 TABLET (15MG) BY MOUTH TWICE A DAY UNTIL 4/9. ON 06/17/20, SWITCH TO 1 TABLET (20MG) BY MOUTH ONCE A DAY. TAKE WITH FOOD. Patient taking differently: TAKE 1 TABLET (15MG) BY MOUTH TWICE A DAY UNTIL 4/9. ON 06/17/20, SWITCH TO 1 TABLET (20MG) BY MOUTH ONCE A DAY. TAKE WITH FOOD. 05/28/20 05/28/21  SaNita SellsMD     Critical care time: 50 minutes       CRITICAL CARE Performed by: LaOtilio Carpenleason   Total critical care time: 50 minutes  Critical care time was exclusive of separately billable procedures and treating other patients.  Critical care was necessary to treat  or prevent imminent or life-threatening deterioration.  Critical care was time spent personally by me on the following activities: development of treatment plan  with patient and/or surrogate as well as nursing, discussions with consultants, evaluation of patient's response to treatment, examination of patient, obtaining history from patient or surrogate, ordering and performing treatments and interventions, ordering and review of laboratory studies, ordering and review of radiographic studies, pulse oximetry and re-evaluation of patient's condition.   Otilio Carpen Jamaris Theard, PA-C [facility] See Amion for pager If no response to pager , please call [phone] until 7pm After 7:00 pm call Elink  [phone]

## 2020-07-01 NOTE — Treatment Plan (Signed)
Patient seen and examined. Volume overloaded. CT with pulmonary edema, bilateral effusions super-imposed on underlying IPF. More lasix given. O2 weaned to 2-4L [place]. NWOB. Transfer out of ICU, wil lask TRH to be primary. PCCM will follow.

## 2020-07-01 NOTE — Progress Notes (Signed)
eLink Physician-Brief Progress Note Patient Name: SUMIKO CEASAR DOB: 1931/05/05 MRN: [id number]   Date of Service  07/01/2020  HPI/Events of Note  Patient admitted with acute on chronic respiratory  failure currently on BIPAP, work up is in progress.  eICU Interventions  New Patient Evaluation completed.        Thomasene Lot Yvaine Jankowiak 07/01/2020, 2:34 AM

## 2020-07-02 DIAGNOSIS — G3109 Other frontotemporal dementia: Secondary | ICD-10-CM

## 2020-07-02 DIAGNOSIS — J849 Interstitial pulmonary disease, unspecified: Secondary | ICD-10-CM

## 2020-07-02 DIAGNOSIS — J9621 Acute and chronic respiratory failure with hypoxia: Secondary | ICD-10-CM | POA: Diagnosis present

## 2020-07-02 DIAGNOSIS — F0280 Dementia in other diseases classified elsewhere without behavioral disturbance: Secondary | ICD-10-CM

## 2020-07-02 LAB — GLUCOSE, CAPILLARY
Glucose-Capillary: 132 mg/dL — ABNORMAL HIGH (ref 70–99)
Glucose-Capillary: 133 mg/dL — ABNORMAL HIGH (ref 70–99)
Glucose-Capillary: 138 mg/dL — ABNORMAL HIGH (ref 70–99)
Glucose-Capillary: 250 mg/dL — ABNORMAL HIGH (ref 70–99)
Glucose-Capillary: 72 mg/dL (ref 70–99)
Glucose-Capillary: 80 mg/dL (ref 70–99)
Glucose-Capillary: 89 mg/dL (ref 70–99)

## 2020-07-02 LAB — BASIC METABOLIC PANEL
Anion gap: 12 (ref 5–15)
BUN: 41 mg/dL — ABNORMAL HIGH (ref 8–23)
CO2: 24 mmol/L (ref 22–32)
Calcium: 8.5 mg/dL — ABNORMAL LOW (ref 8.9–10.3)
Chloride: 104 mmol/L (ref 98–111)
Creatinine, Ser: 1.04 mg/dL — ABNORMAL HIGH (ref 0.44–1.00)
GFR, Estimated: 52 mL/min — ABNORMAL LOW (ref >60)
Glucose, Bld: 96 mg/dL (ref 70–99)
Potassium: 4 mmol/L (ref 3.5–5.1)
Sodium: 140 mmol/L (ref 135–145)

## 2020-07-02 MED ORDER — FUROSEMIDE 10 MG/ML IJ SOLN
20.0000 mg | Freq: Two times a day (BID) | INTRAMUSCULAR | Status: DC
  Administered 2020-07-02 – 2020-07-03 (×3): 20 mg via INTRAVENOUS
  Filled 2020-07-02 (×3): qty 2

## 2020-07-02 NOTE — Progress Notes (Signed)
NAME:  Kristin Cook, MRN:  [id number], DOB:  02/13/1932, LOS: 1 ADMISSION DATE:  06/30/2020, CONSULTATION DATE:  07/02/20 REFERRING MD:  EDP, CHIEF COMPLAINT:  Shortness of breath  History of Present Illness:  Ms. Brockel is a 87 y.o. F with PMH of dementia, IUP, likely pulmonary HTN, HL, subacute PE on xarelto and Covid-19 infection in 02/2020 who presents with acute on chronic dyspnea that has been worse over the last several days per family member at the bedside.  Daughter in law reports that she collapsed in the chair when trying to walk across the room with increased cough.  She has been afebrile and been taking Lasix as prescribed.  Palliative care consult was discussed at last pulmonology office visit and family would like to pursue this.   Per EMS report, pt was saturating in the 60% on 2L.  In the ED, pt was placed on bipap with improvement.  CXR consistent with chronic lung disease.  Labs showed lactic acid of 6, WBC 12k, glucose 317, creatinine 1.2, BNP 817.  PCCM consulted for admission, family confirms DNR/DNI  Pertinent  Medical History   has a past medical history of Arthritis, Cataract, Cystocele, Dementia ([facility]), Diabetes mellitus, Dyslipidemia, History of colonic polyps, MRSA infection, Hyperlipidemia, Hypertension, Mental disorder, Neuropathic pain of lower extremity, OAB (overactive bladder), and Shortness of breath.   Significant Hospital Events: Including procedures, antibiotic start and stop dates in addition to other pertinent events   . 4/24 admit to PCCM .   Interim History / Subjective:  Transitioned to regular nursing floor. She appears comfortable laying flat on nasal cannula. Denies complaints.  Objective   Blood pressure 137/70, pulse 73, temperature 97.6 F (36.4 C), temperature source Oral, resp. rate 18, height 5\' 5"  (1.651 m), weight 58.8 kg, SpO2 100 %.        Intake/Output Summary (Last 24 hours) at 07/02/2020 1001 Last data filed at 07/02/2020  0340 Gross per 24 hour  Intake --  Output 1250 ml  Net -1250 ml   Filed Weights   06/30/20 2203 07/01/20 0500  Weight: 58 kg 58.8 kg    General:  Elderly, chronically ill appearing, laying flat comfortably HEENT: MM pink/moist, sclera anicteric  Neuro: awake, answering questions, moves all extremities  CV: s1s2 rrr, no m/r/g PULM:  Coarse  Bilateral breath sounds, no wheezes, minimal crackles GI: soft, bsx4 active  Extremities: warm/dry, mild edema Skin: no rashes or lesions   Labs/imaging that I havepersonally reviewed  (right click and "Reselect all SmartList Selections" daily)  Na 140, K 4 Cr 1.04 CT Chest 4/24 personally reviewed shows extensive bilateral fibrotic changes consistent with UIP pattern  Resolved Hospital Problem list     Assessment & Plan:   Acute on Chronic Hypoxic Respiratory Failure Interstitial Lung Disease (likely UIP), pulmonary HTN, chronic PE on Xarelto  P: -suspect chronic worsening of the above with acute volume overload Agree with a litte more lasix, she has responded will to initial dosage. I was able to turn her oxygen requirements from [facility] to [facility] and she tolerated this well. I suspect she is approaching baseline which is 2LNC.  -last echo one month ago, EF 65-70%, grade 1 diastolic, severe RV moderately reduced, severely elevated pulmonary artery pressure -continue Xarelto - will need follow up in our office prior to discharge (we will arrange.)  [facility], MD Pulmonary and Critical Care Medicine [facility]   CBC: Recent Labs  Lab 06/30/20 2214 06/30/20 2304  07/01/20 0319  WBC 12.6*  --  12.9*  NEUTROABS 9.6*  --   --   HGB 12.6 11.6* 11.5*  HCT 41.8 34.0* 35.7*  MCV 97.7  --  92.5  PLT 436*  --  325    Basic Metabolic Panel: Recent Labs  Lab 06/30/20 2214 06/30/20 2304 07/01/20 0319 07/02/20 0248  NA 136 136 139 140  K 5.6* 5.0 4.6 4.0  CL 104  --  106 104  CO2 13*  --  21* 24  GLUCOSE 317*   --  71 96  BUN 40*  --  41* 41*  CREATININE 1.29*  --  1.22* 1.04*  CALCIUM 8.8*  --  8.9 8.5*  MG  --   --  1.7  --   PHOS  --   --  3.5  --    GFR: Estimated Creatinine Clearance: 34.9 mL/min (A) (by C-G formula based on SCr of 1.04 mg/dL (H)). Recent Labs  Lab 06/30/20 2214 06/30/20 2242 07/01/20 0319  PROCALCITON  --   --  0.87  WBC 12.6*  --  12.9*  LATICACIDVEN  --  6.5* 3.6*    Liver Function Tests: No results for input(s): AST, ALT, ALKPHOS, BILITOT, PROT, ALBUMIN in the last 168 hours. No results for input(s): LIPASE, AMYLASE in the last 168 hours. No results for input(s): AMMONIA in the last 168 hours.  ABG    Component Value Date/Time   PHART 7.411 06/30/2020 2304   PCO2ART 28.6 (L) 06/30/2020 2304   PO2ART 79 (L) 06/30/2020 2304   HCO3 21.6 07/01/2020 0319   TCO2 19 (L) 06/30/2020 2304   ACIDBASEDEF 2.9 (H) 07/01/2020 0319   O2SAT 88.2 07/01/2020 0319     Coagulation Profile: No results for input(s): INR, PROTIME in the last 168 hours.  Cardiac Enzymes: No results for input(s): CKTOTAL, CKMB, CKMBINDEX, TROPONINI in the last 168 hours.  HbA1C: Hemoglobin A1C  Date/Time Value Ref Range Status  01/18/2020 10:09 AM 9.0 (A) 4.0 - 5.6 % Final  09/13/2019 12:50 PM 8.1 (A) 4.0 - 5.6 % Final   Hgb A1c MFr Bld  Date/Time Value Ref Range Status  05/26/2020 06:58 AM 8.0 (H) 4.8 - 5.6 % Final    Comment:    (NOTE) Pre diabetes:          5.7%-6.4%  Diabetes:              >6.4%  Glycemic control for   <7.0% adults with diabetes     CBG: Recent Labs  Lab 07/01/20 1451 07/01/20 2035 07/01/20 2353 07/02/20 0334 07/02/20 0743  GLUCAP 104* 99 80 89 80

## 2020-07-02 NOTE — Progress Notes (Signed)
OT Cancellation Note  Patient Details Name: Kristin Cook MRN: [id number] DOB: 1932/01/06   Cancelled Treatment:    Reason Eval/Treat Not Completed: Patient declined, no reason specified (Pt reporting "I am comfortable and I do not want to move." OTR describing benefits of EOB tasks, but pt continues to decline.)   Flora Lipps, OTR/L Acute Rehabilitation Services Pager: [phone] Office: [phone]   Lanore Renderos C 07/02/2020, 4:18 PM

## 2020-07-02 NOTE — Progress Notes (Signed)
PROGRESS NOTE    Kristin Cook  RUE:[id number] DOB: 1931-10-12 DOA: 06/30/2020 PCP: Ronnald Nian, MD   Chief Complaint  Patient presents with  . Respiratory Distress  Brief Narrative: 87 year old female dementia, IUFD likely pulmonary hypertension, subacute PE with Xarelto, COVID infection in 02/2020 presented with acute on chronic dyspnea worsening past several days per family and apparently patient collapsed in the chair while trying to walk across the room with increased cough.  Has been taking Lasix as prescribed.  EMS was summoned patient was saturating 60% on 2 L.  In the ED-placed on BiPAP, chest x-ray consistent with chronic lung disease labs showed elevated lactic acid 6 leukocytosis 12 K, glucose 317, creatinine 1.2 BNP 817.  PCCM was consulted.  Patient was admitted to ICU. Patient was managed for acute on chronic hypoxic respiratory failure in the setting of chronic lung disease with fluid overload.Patient was diuresed, oxygen was weaned down and transferred to [facility] service 4/25  Subjective: Seen and examined Resting comfortably.  She is on 4 L nasal cannula at home on 2 L. Denies any shortness of breath chest pain fever chills   Assessment & Plan: Acute on chronic respiratory failure with hypoxia Acute respiratory alkalosis IUP/possible pulmonary hypertension Chronic PE: Felt to be secondary to fluid overload with chronic lung disease.  CT shows acute on chronic airspace disease with body wall edema and right pleural effusion favoring pulmonary edema with atypical appearance due to underlying interstitial lung disease.  Managed with BiPAP initially and subsequently weaned to nasal cannula with diuresis.Net negative balance as below monitor intake output.  Continue supplemental oxygen.  Pulmonary following closely-still appears fluid overloaded, needing 4 L nasal cannula normally on 2 L at home, we will give 20 Lasix IV cuticular x4 and reassess  Net IO Since Admission:  -1,400 mL [07/02/20 0739]   Intake/Output Summary (Last 24 hours) at 07/02/2020 0739 Last data filed at 07/02/2020 0340 Gross per 24 hour  Intake --  Output 1250 ml  Net -1250 ml    HTN: BP controlled.  Home amlodipine on hold.  Dementia: stable  Pulmonary embolism: Continue home Xarelto  Diabetes mellitus with uncontrolled hyperglycemia:hba1c 8.0 in march. Sugar stable, cont ssi.  Metformin discontinued due to lactic acidosis patient also Recent Labs  Lab 07/01/20 1255 07/01/20 1451 07/01/20 2035 07/01/20 2353 07/02/20 0334  GLUCAP 85 104* 99 80 89   Acute metabolic acidosis severe lactic acidosis- felt to be due to metformin: Improved to 3.6 from 6.5.  Acute kidney injury resolved Recent Labs  Lab 06/30/20 2214 07/01/20 0319 07/02/20 0248  BUN 40* 41* 41*  CREATININE 1.29* 1.22* 1.04*   Goal of care patient w/ complex chronic lung disease medical history is, is DNR  Diet Order            DIET SOFT Room service appropriate? Yes; Fluid consistency: Thin  Diet effective now               Patient's Body mass index is 21.57 kg/m.  DVT prophylaxis: SCDs Start: 07/01/20 0034 Code Status:   Code Status: DNR  Family Communication: plan of care discussed with patient at bedside.  Status is: Inpatient Remains inpatient appropriate because:IV treatments appropriate due to intensity of illness or inability to take PO and Inpatient level of care appropriate due to severity of illness  Dispo: The patient is from: Home.  Obtain PT OT evaluation encourage ambulation  Anticipated d/c is to: tbd              Patient currently is not medically stable to d/c.   Difficult to place patient No    Unresulted Labs (From admission, onward)          Start     Ordered   06/30/20 2208  Blood gas, arterial  Once,   STAT        06/30/20 2207           Medications reviewed:  Scheduled Meds: . chlorhexidine  15 mL Mouth Rinse BID  . Chlorhexidine Gluconate Cloth   6 each Topical Q0600  . dextrose  1 ampule Intravenous Once  . insulin aspart  0-15 Units Subcutaneous Q4H  . mouth rinse  15 mL Mouth Rinse q12n4p  . rivaroxaban  20 mg Oral Q supper   Continuous Infusions:  Consultants:see note  Procedures:see note  Antimicrobials: Anti-infectives (From admission, onward)   None     Culture/Microbiology    Component Value Date/Time   SDES BLOOD SITE NOT SPECIFIED 06/30/2020 2242   SPECREQUEST  06/30/2020 2242    BOTTLES DRAWN AEROBIC AND ANAEROBIC Blood Culture adequate volume   CULT  06/30/2020 2242    NO GROWTH < 12 HOURS Performed at [facility], [address], [place], [place] [address]    REPTSTATUS PENDING 06/30/2020 2242    Other culture-see note  Objective: Vitals: Today's Vitals   07/01/20 1857 07/01/20 1906 07/01/20 1915 07/02/20 0337  BP: 137/71   137/70  Pulse: 80   73  Resp: 20   18  Temp: 98.1 F (36.7 C)   97.6 F (36.4 C)  TempSrc:    Oral  SpO2: (!) 89%   100%  Weight:      Height:      PainSc:  0-No pain 0-No pain     Intake/Output Summary (Last 24 hours) at 07/02/2020 0737 Last data filed at 07/02/2020 0340 Gross per 24 hour  Intake --  Output 1250 ml  Net -1250 ml   Filed Weights   06/30/20 2203 07/01/20 0500  Weight: 58 kg 58.8 kg   Weight change:   Intake/Output from previous day: 04/24 0701 - 04/25 0700 In: -  Out: 1250 [Urine:1250] Intake/Output this shift: No intake/output data recorded. Filed Weights   06/30/20 2203 07/01/20 0500  Weight: 58 kg 58.8 kg    Examination: General exam: AAO at baseline, not in distress,NAD,weak appearing. HEENT:Oral mucosa moist,Ear/Nose WNL grossly,dentition normal. Respiratory system: bilaterally crackles present,no use of accessory muscle, non tender. Cardiovascular system: S1 & S2 +, regular, No JVD. Gastrointestinal system: Abdomen soft, NT,ND, BS+. Nervous System:Alert, awake, moving extremities and grossly nonfocal Extremities: Bilateral  lower leg edema, distal peripheral pulses palpable.  Skin: No rashes,no icterus. MSK: Normal muscle bulk,tone, power  Data Reviewed: I have personally reviewed following labs and imaging studies CBC: Recent Labs  Lab 06/30/20 2214 06/30/20 2304 07/01/20 0319  WBC 12.6*  --  12.9*  NEUTROABS 9.6*  --   --   HGB 12.6 11.6* 11.5*  HCT 41.8 34.0* 35.7*  MCV 97.7  --  92.5  PLT 436*  --  325   Basic Metabolic Panel: Recent Labs  Lab 06/30/20 2214 06/30/20 2304 07/01/20 0319 07/02/20 0248  NA 136 136 139 140  K 5.6* 5.0 4.6 4.0  CL 104  --  106 104  CO2 13*  --  21* 24  GLUCOSE 317*  --  71 96  BUN 40*  --  41* 41*  CREATININE 1.29*  --  1.22* 1.04*  CALCIUM 8.8*  --  8.9 8.5*  MG  --   --  1.7  --   PHOS  --   --  3.5  --    GFR: Estimated Creatinine Clearance: 34.9 mL/min (A) (by C-G formula based on SCr of 1.04 mg/dL (H)). Liver Function Tests: No results for input(s): AST, ALT, ALKPHOS, BILITOT, PROT, ALBUMIN in the last 168 hours. No results for input(s): LIPASE, AMYLASE in the last 168 hours. No results for input(s): AMMONIA in the last 168 hours. Coagulation Profile: No results for input(s): INR, PROTIME in the last 168 hours. Cardiac Enzymes: No results for input(s): CKTOTAL, CKMB, CKMBINDEX, TROPONINI in the last 168 hours. BNP (last 3 results) Recent Labs    06/20/20 1229  PROBNP 334.0*   HbA1C: No results for input(s): HGBA1C in the last 72 hours. CBG: Recent Labs  Lab 07/01/20 1255 07/01/20 1451 07/01/20 2035 07/01/20 2353 07/02/20 0334  GLUCAP 85 104* 99 80 89   Lipid Profile: No results for input(s): CHOL, HDL, LDLCALC, TRIG, CHOLHDL, LDLDIRECT in the last 72 hours. Thyroid Function Tests: No results for input(s): TSH, T4TOTAL, FREET4, T3FREE, THYROIDAB in the last 72 hours. Anemia Panel: No results for input(s): VITAMINB12, FOLATE, FERRITIN, TIBC, IRON, RETICCTPCT in the last 72 hours. Sepsis Labs: Recent Labs  Lab 06/30/20 2242  07/01/20 0319  PROCALCITON  --  0.87  LATICACIDVEN 6.5* 3.6*    Recent Results (from the past 240 hour(s))  Culture, blood (routine x 2)     Status: None (Preliminary result)   Collection Time: 06/30/20 10:39 PM   Specimen: BLOOD  Result Value Ref Range Status   Specimen Description BLOOD SITE NOT SPECIFIED  Final   Special Requests   Final    BOTTLES DRAWN AEROBIC AND ANAEROBIC Blood Culture adequate volume   Culture   Final    NO GROWTH < 12 HOURS Performed at [facility], [address], [place], [place] [address]    Report Status PENDING  Incomplete  Culture, blood (routine x 2)     Status: None (Preliminary result)   Collection Time: 06/30/20 10:42 PM   Specimen: BLOOD  Result Value Ref Range Status   Specimen Description BLOOD SITE NOT SPECIFIED  Final   Special Requests   Final    BOTTLES DRAWN AEROBIC AND ANAEROBIC Blood Culture adequate volume   Culture   Final    NO GROWTH < 12 HOURS Performed at [facility], [address] [place], [place] [address]    Report Status PENDING  Incomplete  Resp Panel by RT-PCR (Flu A&B, Covid) Nasopharyngeal Swab     Status: None   Collection Time: 06/30/20 11:45 PM   Specimen: Nasopharyngeal Swab; Nasopharyngeal(NP) swabs in vial transport medium  Result Value Ref Range Status   SARS Coronavirus 2 by RT PCR NEGATIVE NEGATIVE Final    Comment: (NOTE) SARS-CoV-2 target nucleic acids are NOT DETECTED.  The SARS-CoV-2 RNA is generally detectable in upper respiratory specimens during the acute phase of infection. The lowest concentration of SARS-CoV-2 viral copies this assay can detect is 138 copies/mL. A negative result does not preclude SARS-Cov-2 infection and should not be used as the sole basis for treatment or other patient management decisions. A negative result may occur with  improper specimen collection/handling, submission of specimen other than nasopharyngeal swab, presence of viral mutation(s) within  the areas targeted by this assay, and inadequate number of viral copies(<138 copies/mL). A negative result must be combined with clinical observations, patient history, and epidemiological information. The expected result is Negative.  Fact Sheet for Patients:  BloggerCourse.com  Fact Sheet for Healthcare Providers:  SeriousBroker.it  This test is no t yet approved or cleared by the [place] FDA and  has been authorized for detection and/or diagnosis of SARS-CoV-2 by FDA under an Emergency Use Authorization (EUA). This EUA will remain  in effect (meaning this test can be used) for the duration of the COVID-19 declaration under Section 564(b)(1) of the Act, 21 U.S.C.section 360bbb-3(b)(1), unless the authorization is terminated  or revoked sooner.       Influenza A by PCR NEGATIVE NEGATIVE Final   Influenza B by PCR NEGATIVE NEGATIVE Final    Comment: (NOTE) The Xpert Xpress SARS-CoV-2/FLU/RSV plus assay is intended as an aid in the diagnosis of influenza from Nasopharyngeal swab specimens and should not be used as a sole basis for treatment. Nasal washings and aspirates are unacceptable for Xpert Xpress SARS-CoV-2/FLU/RSV testing.  Fact Sheet for Patients: BloggerCourse.com  Fact Sheet for Healthcare Providers: SeriousBroker.it  This test is not yet approved or cleared by the [place] FDA and has been authorized for detection and/or diagnosis of SARS-CoV-2 by FDA under an Emergency Use Authorization (EUA). This EUA will remain in effect (meaning this test can be used) for the duration of the COVID-19 declaration under Section 564(b)(1) of the Act, 21 U.S.C. section 360bbb-3(b)(1), unless the authorization is terminated or revoked.  Performed at [facility], [address], [place], [place] [address]   MRSA PCR Screening     Status: None   Collection Time:  07/01/20  2:17 AM   Specimen: Nasopharyngeal  Result Value Ref Range Status   MRSA by PCR NEGATIVE NEGATIVE Final    Comment:        The GeneXpert MRSA Assay (FDA approved for NASAL specimens only), is one component of a comprehensive MRSA colonization surveillance program. It is not intended to diagnose MRSA infection nor to guide or monitor treatment for MRSA infections. Performed at [facility], [address], [place], [place] [address]      Radiology Studies: CT Chest Wo Contrast  Result Date: 07/01/2020 CLINICAL DATA:  Respiratory illness and nondiagnostic chest x-ray EXAM: CT CHEST WITHOUT CONTRAST TECHNIQUE: Multidetector CT imaging of the chest was performed following the standard protocol without IV contrast. COMPARISON:  05/25/2020 FINDINGS: Cardiovascular: Cardiomegaly. No pericardial effusion. Aortic and coronary atherosclerosis. Mediastinum/Nodes: Reactive appearing mediastinal nodal prominence. Lungs/Pleura: Chronic lung disease based on prior. There is acute patchy ground-glass and consolidative opacities with a new and moderate right pleural effusion. Chronic interlobular septal thickening and fibrotic changes. No pneumothorax Upper Abdomen: Negative Musculoskeletal: No acute finding. Remote T12 compression fracture and cement augmentation. Body wall edema. IMPRESSION: Acute on chronic airspace disease with body wall edema and right pleural effusion favoring pulmonary edema with atypical appearance due to underlying interstitial lung disease. Atypical infection or acute alveolitis should also be considered. Electronically Signed   By: Marnee Spring M.D.   On: 07/01/2020 09:09   DG Chest Port 1 View  Result Date: 06/30/2020 CLINICAL DATA:  Respiratory distress and shortness of breath for 3 days. EXAM: PORTABLE CHEST 1 VIEW COMPARISON:  06/21/2020 FINDINGS: Heart size and pulmonary vascularity are normal for technique. Diffuse reticulonodular interstitial pattern to  both lungs is similar to previous study. Changes likely  represent chronic interstitial lung disease. No pleural effusions. No pneumothorax. Mediastinal contours appear intact. Calcification of the aorta. Vertebral kyphoplasty at T12. IMPRESSION: Chronic interstitial lung disease. Electronically Signed   By: Burman Nieves M.D.   On: 06/30/2020 22:39     LOS: 1 day   Lanae Boast, MD [facility]  07/02/2020, 7:37 AM

## 2020-07-03 ENCOUNTER — Telehealth: Payer: Self-pay | Admitting: Internal Medicine

## 2020-07-03 DIAGNOSIS — Z66 Do not resuscitate: Secondary | ICD-10-CM | POA: Diagnosis present

## 2020-07-03 DIAGNOSIS — Z515 Encounter for palliative care: Secondary | ICD-10-CM

## 2020-07-03 LAB — CBC
HCT: 36.4 % (ref 36.0–46.0)
Hemoglobin: 11.6 g/dL — ABNORMAL LOW (ref 12.0–15.0)
MCH: 29.6 pg (ref 26.0–34.0)
MCHC: 31.9 g/dL (ref 30.0–36.0)
MCV: 92.9 fL (ref 80.0–100.0)
Platelets: 354 10*3/uL (ref 150–400)
RBC: 3.92 MIL/uL (ref 3.87–5.11)
RDW: 14.9 % (ref 11.5–15.5)
WBC: 8.4 10*3/uL (ref 4.0–10.5)
nRBC: 0 % (ref 0.0–0.2)

## 2020-07-03 LAB — BASIC METABOLIC PANEL
Anion gap: 9 (ref 5–15)
BUN: 34 mg/dL — ABNORMAL HIGH (ref 8–23)
CO2: 27 mmol/L (ref 22–32)
Calcium: 8.7 mg/dL — ABNORMAL LOW (ref 8.9–10.3)
Chloride: 102 mmol/L (ref 98–111)
Creatinine, Ser: 0.91 mg/dL (ref 0.44–1.00)
GFR, Estimated: >60 mL/min (ref >60)
Glucose, Bld: 134 mg/dL — ABNORMAL HIGH (ref 70–99)
Potassium: 4.2 mmol/L (ref 3.5–5.1)
Sodium: 138 mmol/L (ref 135–145)

## 2020-07-03 LAB — GLUCOSE, CAPILLARY
Glucose-Capillary: 127 mg/dL — ABNORMAL HIGH (ref 70–99)
Glucose-Capillary: 157 mg/dL — ABNORMAL HIGH (ref 70–99)
Glucose-Capillary: 165 mg/dL — ABNORMAL HIGH (ref 70–99)
Glucose-Capillary: 332 mg/dL — ABNORMAL HIGH (ref 70–99)
Glucose-Capillary: 123 mg/dL — ABNORMAL HIGH (ref 70–99)
Glucose-Capillary: 147 mg/dL — ABNORMAL HIGH (ref 70–99)

## 2020-07-03 LAB — BRAIN NATRIURETIC PEPTIDE: B Natriuretic Peptide: 646.4 pg/mL — ABNORMAL HIGH (ref 0.0–100.0)

## 2020-07-03 MED ORDER — MORPHINE SULFATE (CONCENTRATE) 10 MG/0.5ML PO SOLN: 5.0000 mg | ORAL | Status: DC | PRN

## 2020-07-03 MED ORDER — ACETAMINOPHEN 500 MG PO TABS
1000.0000 mg | ORAL_TABLET | Freq: Every day | ORAL | Status: DC
  Administered 2020-07-03: 1000 mg via ORAL
  Filled 2020-07-03: qty 2

## 2020-07-03 MED ORDER — FUROSEMIDE 10 MG/ML IJ SOLN
40.0000 mg | Freq: Two times a day (BID) | INTRAMUSCULAR | Status: AC
  Administered 2020-07-03: 40 mg via INTRAVENOUS
  Filled 2020-07-03: qty 4

## 2020-07-03 MED ORDER — ACETAMINOPHEN 325 MG PO TABS: 650.0000 mg | ORAL_TABLET | Freq: Four times a day (QID) | ORAL | Status: DC | PRN

## 2020-07-03 NOTE — Evaluation (Signed)
Occupational Therapy Evaluation Patient Details Name: Kristin Cook MRN: [id number] DOB: 1931-11-08 Today's Date: 07/03/2020    History of Present Illness 87 y.o. female who presented 07/01/20 with acute on chronic dyspnea that has been worse over the last several days per family member at the bedside.  Daughter in law reports that she collapsed in the chair when trying to walk across the room. EMS sats in 60s on 2L. BiPAP in ED   PMH significant of dementia, mental disorder, type II diabetes, hypertension, hyperlipidemia, ILD, pulmonary hypertension, Covid infection and kyphoplasty in December 2021 with discharge to SNF   Clinical Impression   Pt PTA: Pt living with DIL in separate apartment in home. Pt reports independence with ADL and mobility with SPC. Pt currently, limited by decreased strength, activity tolerlance and ability to care for self. Pt minA for ADL and minA for mobility with +2 handheld assist. Pt requires encouragement for OOB tasks. Pt's DIL apparently works from home so hoping that initially pt can return home with near 24/7. Or think about purchasing a Life Alert or cameras in home. Pt's O2 on 3.5L O2 with sats >90%. Pt would benefit from continued OT skilled services. OT following acutely.     Follow Up Recommendations  Home health OT;Supervision/Assistance - 24 hour    Equipment Recommendations  None recommended by OT    Recommendations for Other Services       Precautions / Restrictions Precautions Precautions: Fall Restrictions Weight Bearing Restrictions: No      Mobility Bed Mobility Overal bed mobility: Needs Assistance Bed Mobility: Rolling;Sidelying to Sit Rolling: Min assist Sidelying to sit: Min assist;HOB elevated       General bed mobility comments: Pt needed encouragement and insisted on lying back down    Transfers Overall transfer level: Needs assistance Equipment used: 1 person hand held assist Transfers: Sit to/from Stand Sit to  Stand: Min guard         General transfer comment: pt accustomed to using a cane; steadying assist only    Balance Overall balance assessment: Needs assistance Sitting-balance support: Feet supported Sitting balance-Leahy Scale: Fair     Standing balance support: Single extremity supported Standing balance-Leahy Scale: Poor                             ADL either performed or assessed with clinical judgement   ADL Overall ADL's : Needs assistance/impaired Eating/Feeding: Set up;Sitting   Grooming: Set up;Sitting Grooming Details (indicate cue type and reason): brushing gums with to[place]te. Upper Body Bathing: Set up;Sitting   Lower Body Bathing: Minimal assistance;Sit to/from stand   Upper Body Dressing : Set up;Sitting   Lower Body Dressing: Minimal assistance;Sit to/from stand   Toilet Transfer: Minimal assistance;Ambulation;RW Toilet Transfer Details (indicate cue type and reason): walked around the bed to recliner with +2 hand held assist Toileting- Clothing Manipulation and Hygiene: Moderate assistance;Sit to/from stand;Cueing for safety       Functional mobility during ADLs: Moderate assistance;Rolling walker;Cueing for safety General ADL Comments: Pt limited by decreased strength, activity tolerlance and ability to care for self.     Vision Baseline Vision/History: Wears glasses Wears Glasses: At all times Patient Visual Report: No change from baseline Vision Assessment?: No apparent visual deficits     Perception     Praxis      Pertinent Vitals/Pain Pain Assessment: Faces Faces Pain Scale: Hurts whole lot Pain Location: left knee and generalized Pain  Descriptors / Indicators: Grimacing;Moaning Pain Intervention(s): Monitored during session     Hand Dominance Right   Extremity/Trunk Assessment Upper Extremity Assessment Upper Extremity Assessment: Generalized weakness   Lower Extremity Assessment Lower Extremity Assessment:  Generalized weakness   Cervical / Trunk Assessment Cervical / Trunk Assessment: Kyphotic   Communication Communication Communication: HOH   Cognition Arousal/Alertness: Awake/alert Behavior During Therapy: WFL for tasks assessed/performed Overall Cognitive Status: Within Functional Limits for tasks assessed                                 General Comments: oriented x 4   General Comments  3.5LO2 >91% with exertion 99% O2 at rest    Exercises     Shoulder Instructions      Home Living Family/patient expects to be discharged to:: Private residence Living Arrangements: Children (DIL lives upstairs) Available Help at Discharge: Family;Available PRN/intermittently (DIL works from home) Type of Home: House Home Access: Stairs to enter [employer] of Steps: 2   Home Layout: Two level;Able to live on main level with bedroom/bathroom     Bathroom Shower/Tub: Producer, television/film/video: Standard     Home Equipment: Cane - single point;Shower seat   Additional Comments: DiL lives upstairs      Prior Functioning/Environment Level of Independence: Needs assistance  Gait / Transfers Assistance Needed: modified independent with cane ADL's / Homemaking Assistance Needed: DIL does grocery shopping and picks up medications; otherwise pt is modified independent with ADLs Communication / Swallowing Assistance Needed: HOH Comments: DIl works from home and can check on pt        OT Problem List: Decreased strength;Decreased activity tolerance;Impaired balance (sitting and/or standing);Decreased cognition;Decreased safety awareness;Cardiopulmonary status limiting activity;Pain      OT Treatment/Interventions: Self-care/ADL training;Therapeutic exercise;Energy conservation;DME and/or AE instruction;Therapeutic activities;Cognitive remediation/compensation;Patient/family education;Balance training    OT Goals(Current goals can be found in the care plan  section) Acute Rehab OT Goals Patient Stated Goal: go home OT Goal Formulation: With patient Time For Goal Achievement: 07/17/20 Potential to Achieve Goals: Good ADL Goals Pt Will Perform Grooming: with supervision;standing Pt Will Perform Lower Body Dressing: with min guard assist;sitting/lateral leans;sit to/from stand Pt Will Transfer to Toilet: with supervision;ambulating;regular height toilet Pt Will Perform Toileting - Clothing Manipulation and hygiene: with min guard assist;sit to/from stand Additional ADL Goal #1: Pt will participate in x10 mins of OOB ADL with minguardA overall to increase activity tolerance. Additional ADL Goal #2: Pt will state 3 energy conservation techniques for ADL/mobility.  OT Frequency: Min 2X/week   Barriers to D/C:            Co-evaluation PT/OT/SLP Co-Evaluation/Treatment: Yes Reason for Co-Treatment: Necessary to address cognition/behavior during functional activity   OT goals addressed during session: ADL's and self-care      AM-PAC OT "6 Clicks" Daily Activity     Outcome Measure Help from another person eating meals?: None Help from another person taking care of personal grooming?: A Little Help from another person toileting, which includes using toliet, bedpan, or urinal?: A Little Help from another person bathing (including washing, rinsing, drying)?: A Little Help from another person to put on and taking off regular upper body clothing?: A Little Help from another person to put on and taking off regular lower body clothing?: A Little 6 Click Score: 19   End of Session Equipment Utilized During Treatment: Gait belt;Oxygen Nurse Communication: Mobility status  Activity Tolerance: Patient tolerated treatment well Patient left: in chair;with call bell/phone within reach;with chair alarm set  OT Visit Diagnosis: Unsteadiness on feet (R26.81);Muscle weakness (generalized) (M62.81);Other symptoms and signs involving cognitive function                 Time: 1020-1050 OT Time Calculation (min): 30 min Charges:  OT General Charges $OT Visit: 1 Visit OT Evaluation $OT Eval Moderate Complexity: 1 Mod  Flora Lipps, OTR/L Acute Rehabilitation Services Pager: [phone] Office: [phone]  Dao Mearns C 07/03/2020, 3:28 PM

## 2020-07-03 NOTE — Progress Notes (Signed)
NAME:  Kristin Cook, MRN:  [id number], DOB:  Aug 10, 1931, LOS: 2 ADMISSION DATE:  06/30/2020, CONSULTATION DATE:  07/03/20 REFERRING MD:  EDP, CHIEF COMPLAINT:  Shortness of breath  History of Present Illness:  Kristin Cook is a 87 y.o. F with PMH of dementia, IUP, likely pulmonary HTN, HL, subacute PE on xarelto and Covid-19 infection in 02/2020 who presents with acute on chronic dyspnea that has been worse over the last several days per family member at the bedside.  Daughter in law reports that she collapsed in the chair when trying to walk across the room with increased cough.  She has been afebrile and been taking Lasix as prescribed.  Palliative care consult was discussed at last pulmonology office visit and family would like to pursue this.   Per EMS report, pt was saturating in the 60% on 2L.  In the ED, pt was placed on bipap with improvement.  CXR consistent with chronic lung disease.  Labs showed lactic acid of 6, WBC 12k, glucose 317, creatinine 1.2, BNP 817.  PCCM consulted for admission, family confirms DNR/DNI  Pertinent  Medical History   has a past medical history of Arthritis, Cataract, Cystocele, Dementia (HCC), Diabetes mellitus, Dyslipidemia, History of colonic polyps, MRSA infection, Hyperlipidemia, Hypertension, Mental disorder, Neuropathic pain of lower extremity, OAB (overactive bladder), and Shortness of breath.   Significant Hospital Events: Including procedures, antibiotic start and stop dates in addition to other pertinent events   . 4/24 admit to PCCM, quickly transitioned to [facility] not requiring bipap . 4/25 declined working with OT  Interim History / Subjective:  Resting comfortably on 3LNC. No distress  Objective   Blood pressure 121/68, pulse (!) 110, temperature 99.1 F (37.3 C), resp. rate 20, height 5\' 5"  (1.651 m), weight 58.8 kg, SpO2 (!) 89 %.        Intake/Output Summary (Last 24 hours) at 07/03/2020 0911 Last data filed at 07/02/2020 1331 Gross per  24 hour  Intake --  Output 900 ml  Net -900 ml   Filed Weights   06/30/20 2203 07/01/20 0500  Weight: 58 kg 58.8 kg    General:  Elderly, chronically ill appearing, laying flat comfortably HEENT: on nasal cannula CV: tachycardic, regular PULM:  Coarse bilateral air entry, no wheezes or crackles to anterior auscultation GI: soft, bsx4 active  Extremities: warm/dry, mild edema   Labs/imaging that I havepersonally reviewed  (right click and "Reselect all SmartList Selections" daily)  Na 138, K 4.2, BNP 646, WBC 8.4 CT Chest 4/24 personally reviewed shows extensive bilateral fibrotic changes consistent with UIP pattern  Resolved Hospital Problem list     Assessment & Plan:   Acute on Chronic Hypoxic Respiratory Failure Interstitial Lung Disease (likely UIP), Group 2pulmonary HTN, chronic PE on Xarelto  P: -suspect chronic worsening of the above with acute volume overload, she is now down to 3-4LNC which I suspect is not far from baseline. She has diuresed over 2L since admission.  - my concern is that with her dementia and deconditioning, this is progression of her disease. 3-4LNC may be her new baseline oxygen requirement - agree with palliative medicine consultation as she seems to be declining aspects of her care and not everything we are doing may be consistent with her current goals. Pulmonary is available for any meetings.  -continue Xarelto - will need follow up in our office prior to discharge (we will arrange.)  5/24, MD Pulmonary and Critical Care Medicine [facility]  Labs   CBC: Recent Labs  Lab 06/30/20 2214 06/30/20 2304 07/01/20 0319 07/03/20 0412  WBC 12.6*  --  12.9* 8.4  NEUTROABS 9.6*  --   --   --   HGB 12.6 11.6* 11.5* 11.6*  HCT 41.8 34.0* 35.7* 36.4  MCV 97.7  --  92.5 92.9  PLT 436*  --  325 354    Basic Metabolic Panel: Recent Labs  Lab 06/30/20 2214 06/30/20 2304 07/01/20 0319 07/02/20 0248 07/03/20 0412  NA 136  136 139 140 138  K 5.6* 5.0 4.6 4.0 4.2  CL 104  --  106 104 102  CO2 13*  --  21* 24 27  GLUCOSE 317*  --  71 96 134*  BUN 40*  --  41* 41* 34*  CREATININE 1.29*  --  1.22* 1.04* 0.91  CALCIUM 8.8*  --  8.9 8.5* 8.7*  MG  --   --  1.7  --   --   PHOS  --   --  3.5  --   --    GFR: Estimated Creatinine Clearance: 39.9 mL/min (by C-G formula based on SCr of 0.91 mg/dL). Recent Labs  Lab 06/30/20 2214 06/30/20 2242 07/01/20 0319 07/03/20 0412  PROCALCITON  --   --  0.87  --   WBC 12.6*  --  12.9* 8.4  LATICACIDVEN  --  6.5* 3.6*  --     Liver Function Tests: No results for input(s): AST, ALT, ALKPHOS, BILITOT, PROT, ALBUMIN in the last 168 hours. No results for input(s): LIPASE, AMYLASE in the last 168 hours. No results for input(s): AMMONIA in the last 168 hours.  ABG    Component Value Date/Time   PHART 7.411 06/30/2020 2304   PCO2ART 28.6 (L) 06/30/2020 2304   PO2ART 79 (L) 06/30/2020 2304   HCO3 21.6 07/01/2020 0319   TCO2 19 (L) 06/30/2020 2304   ACIDBASEDEF 2.9 (H) 07/01/2020 0319   O2SAT 88.2 07/01/2020 0319     Coagulation Profile: No results for input(s): INR, PROTIME in the last 168 hours.  Cardiac Enzymes: No results for input(s): CKTOTAL, CKMB, CKMBINDEX, TROPONINI in the last 168 hours.  HbA1C: Hemoglobin A1C  Date/Time Value Ref Range Status  01/18/2020 10:09 AM 9.0 (A) 4.0 - 5.6 % Final  09/13/2019 12:50 PM 8.1 (A) 4.0 - 5.6 % Final   Hgb A1c MFr Bld  Date/Time Value Ref Range Status  05/26/2020 06:58 AM 8.0 (H) 4.8 - 5.6 % Final    Comment:    (NOTE) Pre diabetes:          5.7%-6.4%  Diabetes:              >6.4%  Glycemic control for   <7.0% adults with diabetes     CBG: Recent Labs  Lab 07/02/20 1648 07/02/20 2120 07/02/20 2345 07/03/20 0429 07/03/20 0805  GLUCAP 72 250* 138* 127* 157*

## 2020-07-03 NOTE — Telephone Encounter (Signed)
07/19/20 at 1030 with TP  Should show up on discharge paperwork from hospital  Will route to Dr. Celine Mans as Lorain Childes

## 2020-07-03 NOTE — Evaluation (Signed)
Physical Therapy Evaluation Patient Details Name: Kristin Cook MRN: [id number] DOB: 1932-03-09 Today's Date: 07/03/2020   History of Present Illness  87 y.o. female who presented 07/01/20 with acute on chronic dyspnea that has been worse over the last several days per family member at the bedside.  Daughter in law reports that she collapsed in the chair when trying to walk across the room. EMS sats in 60s on 2L. BiPAP in ED   PMH significant of dementia, mental disorder, type II diabetes, hypertension, hyperlipidemia, ILD, pulmonary hypertension, Covid infection and kyphoplasty in December 2021 with discharge to SNF  Clinical Impression   Pt admitted secondary to problem above with deficits below. PTA patient was modified independent using cane. Her daughter in law lives upstairs and they share a kitchen. She provides supervision for pt and does her grocery shopping (per pt). Pt currently requires min assist for ambulating short distance with hand-held assist. Will need to assess safety with cane. Likely could benefit from use of RW however anticipate she will refuse to use.  Will continue to follow acutely to maximize functional mobility independence and safety.       Follow Up Recommendations Home health PT;Supervision/Assistance - 24 hour (24/7 initial few days (?if Dtr in law can provide))    Equipment Recommendations  Rolling walker with 5" wheels (pt will likely refuse RW)    Recommendations for Other Services       Precautions / Restrictions Precautions Precautions: Fall      Mobility  Bed Mobility Overal bed mobility: Needs Assistance Bed Mobility: Rolling;Sidelying to Sit Rolling: Min assist Sidelying to sit: Min assist;HOB elevated       General bed mobility comments: pt required encouragement and reminding that if she wants to go home she has to get up and keep her strength up    Transfers Overall transfer level: Needs assistance Equipment used: 1 person hand  held assist Transfers: Sit to/from Stand Sit to Stand: Min guard         General transfer comment: pt accustomed to using a cane; steadying assist only  Ambulation/Gait Ambulation/Gait assistance: Min assist Gait Distance (Feet): 15 Feet Assistive device: 1 person hand held assist Gait Pattern/deviations: Step-through pattern;Decreased stride length;Shuffle;Trunk flexed Gait velocity: decr Gait velocity interpretation: <1.8 ft/sec, indicate of risk for recurrent falls General Gait Details: pt without imbalance  Stairs            Wheelchair Mobility    Modified Rankin (Stroke Patients Only)       Balance Overall balance assessment: Needs assistance Sitting-balance support: Feet supported Sitting balance-Leahy Scale: Fair     Standing balance support: Single extremity supported Standing balance-Leahy Scale: Poor                               Pertinent Vitals/Pain Pain Assessment: Faces Faces Pain Scale: Hurts whole lot Pain Location: left knee with ROM Pain Descriptors / Indicators: Grimacing;Moaning Pain Intervention(s): Limited activity within patient's tolerance;Monitored during session    Home Living Family/patient expects to be discharged to:: Private residence Living Arrangements: Children (DIL lives upstairs) Available Help at Discharge: Family;Available PRN/intermittently (DIL works from home) Type of Home: House Home Access: Stairs to enter   [employer] of Steps: 2 Home Layout: Two level;Able to live on main level with bedroom/bathroom Home Equipment: Gilmer Mor - single point;Shower seat Additional Comments: DiL lives upstairs    Prior Function Level of Independence: Needs assistance  Gait / Transfers Assistance Needed: modified independent with cane  ADL's / Homemaking Assistance Needed: DIL does grocery shopping and picks up medications; otherwise pt is modified independent with ADLs        Hand Dominance   Dominant  Hand: Right    Extremity/Trunk Assessment   Upper Extremity Assessment Upper Extremity Assessment: Defer to OT evaluation    Lower Extremity Assessment Lower Extremity Assessment: Generalized weakness (bil LEs 3+ to 4/5)    Cervical / Trunk Assessment Cervical / Trunk Assessment: Kyphotic  Communication   Communication: HOH  Cognition Arousal/Alertness: Awake/alert Behavior During Therapy: WFL for tasks assessed/performed Overall Cognitive Status: Within Functional Limits for tasks assessed                                 General Comments: oriented x 4      General Comments General comments (skin integrity, edema, etc.): on 3.5 L O2 with sats 98% down to 91% with walking and back up to 100% at rest.    Exercises     Assessment/Plan    PT Assessment Patient needs continued PT services  PT Problem List Decreased strength;Decreased activity tolerance;Decreased balance;Decreased mobility;Decreased knowledge of use of DME;Cardiopulmonary status limiting activity       PT Treatment Interventions DME instruction;Gait training;Stair training;Functional mobility training;Therapeutic activities;Therapeutic exercise;Balance training;Patient/family education    PT Goals (Current goals can be found in the Care Plan section)  Acute Rehab PT Goals Patient Stated Goal: go home PT Goal Formulation: With patient Time For Goal Achievement: 07/17/20 Potential to Achieve Goals: Good    Frequency Min 3X/week   Barriers to discharge Decreased caregiver support DIL provides intermittent supervision (works from home)    Co-evaluation PT/OT/SLP Co-Evaluation/Treatment: Yes Reason for Co-Treatment: Necessary to address cognition/behavior during functional activity PT goals addressed during session: Mobility/safety with mobility;Balance         AM-PAC PT "6 Clicks" Mobility  Outcome Measure Help needed turning from your back to your side while in a flat bed without  using bedrails?: A Little Help needed moving from lying on your back to sitting on the side of a flat bed without using bedrails?: A Little Help needed moving to and from a bed to a chair (including a wheelchair)?: A Little Help needed standing up from a chair using your arms (e.g., wheelchair or bedside chair)?: A Little Help needed to walk in hospital room?: A Little Help needed climbing 3-5 steps with a railing? : A Little 6 Click Score: 18    End of Session Equipment Utilized During Treatment: Oxygen Activity Tolerance: Patient limited by fatigue Patient left: in chair;with call bell/phone within reach;with chair alarm set Nurse Communication: Mobility status PT Visit Diagnosis: Muscle weakness (generalized) (M62.81);Difficulty in walking, not elsewhere classified (R26.2)    Time: 1022-1050 PT Time Calculation (min) (ACUTE ONLY): 28 min   Charges:   PT Evaluation $PT Eval Moderate Complexity: 1 Mod           Jerolyn Center, PT Pager [phone]   Zena Amos 07/03/2020, 11:03 AM

## 2020-07-03 NOTE — Progress Notes (Signed)
PROGRESS NOTE    Kristin ORTEGO  Cook:[id number] DOB: Jun 08, 1931 DOA: 06/30/2020 PCP: Ronnald Nian, MD   Chief Complaint  Patient presents with  . Respiratory Distress  Brief Narrative: 87 year old female dementia, IUFD likely pulmonary hypertension, subacute PE with Xarelto, COVID infection in 02/2020 presented with acute on chronic dyspnea worsening past several days per family and apparently patient collapsed in the chair while trying to walk across the room with increased cough.  Has been taking Lasix as prescribed.  EMS was summoned patient was saturating 60% on 2 L.  In the ED-placed on BiPAP, chest x-ray consistent with chronic lung disease labs showed elevated lactic acid 6 leukocytosis 12 K, glucose 317, creatinine 1.2 BNP 817.  PCCM was consulted.  Patient was admitted to ICU. Patient was managed for acute on chronic hypoxic respiratory failure in the setting of chronic lung disease with fluid overload.Patient was diuresed, oxygen was weaned down and transferred to [facility] service 4/25  Subjective: On 3 to nasal cannula this morning alert awake, responds w/ "yes". Pleasantly confused with dementia. Declined working with PT yesterday  Assessment & Plan: Acute on chronic respiratory failure with hypoxia Acute respiratory alkalosis IUP/possible pulmonary hypertension Chronic PE: Felt to be secondary to fluid overload with chronic lung disease/iup/pULM HTN. CT shows acute on chronic airspace disease with body wall edema and right pleural effusion favoring pulmonary edema with atypical appearance due to underlying interstitial lung disease.  Managed with BiPAP initially and subsequently weaned to nasal cannula with diuresis.  On 3 L nasal cannula.  This may be her new baseline with 3 to 4 L.  On gentle IV diuresis Lasix 20 mg daily net -2.3 L, appreciate pulmonary input.  Palliative care has been consulted to discuss further GOC. D/w w/ pulm. Patient significant declining overall. Net IO  Since Admission: -2,300 mL [07/03/20 0950]   Intake/Output Summary (Last 24 hours) at 07/03/2020 0950 Last data filed at 07/02/2020 1331 Gross per 24 hour  Intake --  Output 900 ml  Net -900 ml    HTN: BP controlled.Home amlodipine on hold.  Dementia: She is alert awake and interactive.  Pulmonary embolism: on Xarelto  Diabetes mellitus with uncontrolled hyperglycemia:hba1c 8.0 in march.  Well-controlled currently continue sliding scale insulin.Metformin discontinued due to lactic acidosis. Recent Labs  Lab 07/02/20 1648 07/02/20 2120 07/02/20 2345 07/03/20 0429 07/03/20 0805  GLUCAP 72 250* 138* 127* 157*   Acute metabolic acidosis severe lactic acidosis- felt to be due to metformin: Improved to 3.6 from 6.5.  Metformin has been discontinued.  Acute kidney injury resolved.  Creatinine improving with diuresis. Recent Labs  Lab 06/30/20 2214 07/01/20 0319 07/02/20 0248 07/03/20 0412  BUN 40* 41* 41* 34*  CREATININE 1.29* 1.22* 1.04* 0.91   Goal of care patient w/ complex chronic lung disease medical history is, is DNR.  Palliative care consult was requested per Daughter request.  Diet Order            DIET SOFT Room service appropriate? Yes; Fluid consistency: Thin  Diet effective now               Patient's Body mass index is 21.57 kg/m.  DVT prophylaxis: SCDs Start: 07/01/20 0034 Code Status:   Code Status: DNR  Family Communication: plan of care discussed with patient at bedside. Discussed with patient's daughter 4/25-plan is to discuss palliative care.  Status is: Inpatient Remains inpatient appropriate because:IV treatments appropriate due to intensity of illness or inability to take PO  and Inpatient level of care appropriate due to severity of illness  Dispo: The patient is from: Home.  Continue PT OT-but patient has been refusing.              Anticipated d/c is to: tbd              Patient currently is not medically stable to d/c.   Difficult to place  patient No    Unresulted Labs (From admission, onward)          Start     Ordered   07/03/20 0500  Basic metabolic panel  Daily,   R     Question:  Specimen collection method  Answer:  Lab=Lab collect   07/02/20 0819   07/03/20 0500  CBC  Daily,   R     Question:  Specimen collection method  Answer:  Lab=Lab collect   07/02/20 0819           Medications reviewed:  Scheduled Meds: . chlorhexidine  15 mL Mouth Rinse BID  . Chlorhexidine Gluconate Cloth  6 each Topical Q0600  . dextrose  1 ampule Intravenous Once  . furosemide  20 mg Intravenous Q12H  . insulin aspart  0-15 Units Subcutaneous Q4H  . mouth rinse  15 mL Mouth Rinse q12n4p  . rivaroxaban  20 mg Oral Q supper   Continuous Infusions:  Consultants:see note  Procedures:see note  Antimicrobials: Anti-infectives (From admission, onward)   None     Culture/Microbiology    Component Value Date/Time   SDES BLOOD SITE NOT SPECIFIED 06/30/2020 2242   SPECREQUEST  06/30/2020 2242    BOTTLES DRAWN AEROBIC AND ANAEROBIC Blood Culture adequate volume   CULT  06/30/2020 2242    NO GROWTH 3 DAYS Performed at [facility] Lab, [address], [place], [place] [address]    REPTSTATUS PENDING 06/30/2020 2242    Other culture-see note  Objective: Vitals: Today's Vitals   07/02/20 1351 07/02/20 2010 07/02/20 2100 07/03/20 0838  BP: 118/66 121/68    Pulse: 73 (!) 110    Resp: 16 20    Temp: 98.1 F (36.7 C) 99.1 F (37.3 C)    TempSrc:      SpO2: 100% (!) 89%    Weight:      Height:      PainSc:   0-No pain 0-No pain    Intake/Output Summary (Last 24 hours) at 07/03/2020 0950 Last data filed at 07/02/2020 1331 Gross per 24 hour  Intake --  Output 900 ml  Net -900 ml   Filed Weights   06/30/20 2203 07/01/20 0500  Weight: 58 kg 58.8 kg   Weight change:   Intake/Output from previous day: 04/25 0701 - 04/26 0700 In: -  Out: 900 [Urine:900] Intake/Output this shift: No intake/output data  recorded. Filed Weights   06/30/20 2203 07/01/20 0500  Weight: 58 kg 58.8 kg    Examination: General exam: AAO to self, pleasantly confused and demented, NAD, weak appearing. HEENT:Oral mucosa moist, Ear/Nose WNL grossly, dentition normal. Respiratory system: bilaterally diminished,no use of accessory muscle Cardiovascular system: S1 & S2 +, No JVD,. Gastrointestinal system: Abdomen soft, NT,ND, BS+ Nervous System:Alert, awake, moving extremities and grossly nonfocal Extremities: Lower leg edema+, distal peripheral pulses palpable.  Skin: No rashes,no icterus. MSK: Normal muscle bulk,tone, power  Data Reviewed: I have personally reviewed following labs and imaging studies CBC: Recent Labs  Lab 06/30/20 2214 06/30/20 2304 07/01/20 0319 07/03/20 0412  WBC 12.6*  --  12.9* 8.4  NEUTROABS 9.6*  --   --   --   HGB 12.6 11.6* 11.5* 11.6*  HCT 41.8 34.0* 35.7* 36.4  MCV 97.7  --  92.5 92.9  PLT 436*  --  325 354   Basic Metabolic Panel: Recent Labs  Lab 06/30/20 2214 06/30/20 2304 07/01/20 0319 07/02/20 0248 07/03/20 0412  NA 136 136 139 140 138  K 5.6* 5.0 4.6 4.0 4.2  CL 104  --  106 104 102  CO2 13*  --  21* 24 27  GLUCOSE 317*  --  71 96 134*  BUN 40*  --  41* 41* 34*  CREATININE 1.29*  --  1.22* 1.04* 0.91  CALCIUM 8.8*  --  8.9 8.5* 8.7*  MG  --   --  1.7  --   --   PHOS  --   --  3.5  --   --    GFR: Estimated Creatinine Clearance: 39.9 mL/min (by C-G formula based on SCr of 0.91 mg/dL). Liver Function Tests: No results for input(s): AST, ALT, ALKPHOS, BILITOT, PROT, ALBUMIN in the last 168 hours. No results for input(s): LIPASE, AMYLASE in the last 168 hours. No results for input(s): AMMONIA in the last 168 hours. Coagulation Profile: No results for input(s): INR, PROTIME in the last 168 hours. Cardiac Enzymes: No results for input(s): CKTOTAL, CKMB, CKMBINDEX, TROPONINI in the last 168 hours. BNP (last 3 results) Recent Labs    06/20/20 1229  PROBNP  334.0*   HbA1C: No results for input(s): HGBA1C in the last 72 hours. CBG: Recent Labs  Lab 07/02/20 1648 07/02/20 2120 07/02/20 2345 07/03/20 0429 07/03/20 0805  GLUCAP 72 250* 138* 127* 157*   Lipid Profile: No results for input(s): CHOL, HDL, LDLCALC, TRIG, CHOLHDL, LDLDIRECT in the last 72 hours. Thyroid Function Tests: No results for input(s): TSH, T4TOTAL, FREET4, T3FREE, THYROIDAB in the last 72 hours. Anemia Panel: No results for input(s): VITAMINB12, FOLATE, FERRITIN, TIBC, IRON, RETICCTPCT in the last 72 hours. Sepsis Labs: Recent Labs  Lab 06/30/20 2242 07/01/20 0319  PROCALCITON  --  0.87  LATICACIDVEN 6.5* 3.6*    Recent Results (from the past 240 hour(s))  Culture, blood (routine x 2)     Status: None (Preliminary result)   Collection Time: 06/30/20 10:39 PM   Specimen: BLOOD  Result Value Ref Range Status   Specimen Description BLOOD SITE NOT SPECIFIED  Final   Special Requests   Final    BOTTLES DRAWN AEROBIC AND ANAEROBIC Blood Culture adequate volume   Culture   Final    NO GROWTH 3 DAYS Performed at [facility], [address] [place], [place] [address]    Report Status PENDING  Incomplete  Culture, blood (routine x 2)     Status: None (Preliminary result)   Collection Time: 06/30/20 10:42 PM   Specimen: BLOOD  Result Value Ref Range Status   Specimen Description BLOOD SITE NOT SPECIFIED  Final   Special Requests   Final    BOTTLES DRAWN AEROBIC AND ANAEROBIC Blood Culture adequate volume   Culture   Final    NO GROWTH 3 DAYS Performed at [facility], [address], [place], [place] [address]    Report Status PENDING  Incomplete  Resp Panel by RT-PCR (Flu A&B, Covid) Nasopharyngeal Swab     Status: None   Collection Time: 06/30/20 11:45 PM   Specimen: Nasopharyngeal Swab; Nasopharyngeal(NP) swabs in vial transport medium  Result  Value Ref Range Status   SARS Coronavirus 2 by RT PCR NEGATIVE NEGATIVE Final    Comment:  (NOTE) SARS-CoV-2 target nucleic acids are NOT DETECTED.  The SARS-CoV-2 RNA is generally detectable in upper respiratory specimens during the acute phase of infection. The lowest concentration of SARS-CoV-2 viral copies this assay can detect is 138 copies/mL. A negative result does not preclude SARS-Cov-2 infection and should not be used as the sole basis for treatment or other patient management decisions. A negative result may occur with  improper specimen collection/handling, submission of specimen other than nasopharyngeal swab, presence of viral mutation(s) within the areas targeted by this assay, and inadequate number of viral copies(<138 copies/mL). A negative result must be combined with clinical observations, patient history, and epidemiological information. The expected result is Negative.  Fact Sheet for Patients:  BloggerCourse.com  Fact Sheet for Healthcare Providers:  SeriousBroker.it  This test is no t yet approved or cleared by the [place] FDA and  has been authorized for detection and/or diagnosis of SARS-CoV-2 by FDA under an Emergency Use Authorization (EUA). This EUA will remain  in effect (meaning this test can be used) for the duration of the COVID-19 declaration under Section 564(b)(1) of the Act, 21 U.S.C.section 360bbb-3(b)(1), unless the authorization is terminated  or revoked sooner.       Influenza A by PCR NEGATIVE NEGATIVE Final   Influenza B by PCR NEGATIVE NEGATIVE Final    Comment: (NOTE) The Xpert Xpress SARS-CoV-2/FLU/RSV plus assay is intended as an aid in the diagnosis of influenza from Nasopharyngeal swab specimens and should not be used as a sole basis for treatment. Nasal washings and aspirates are unacceptable for Xpert Xpress SARS-CoV-2/FLU/RSV testing.  Fact Sheet for Patients: BloggerCourse.com  Fact Sheet for Healthcare  Providers: SeriousBroker.it  This test is not yet approved or cleared by the [place] FDA and has been authorized for detection and/or diagnosis of SARS-CoV-2 by FDA under an Emergency Use Authorization (EUA). This EUA will remain in effect (meaning this test can be used) for the duration of the COVID-19 declaration under Section 564(b)(1) of the Act, 21 U.S.C. section 360bbb-3(b)(1), unless the authorization is terminated or revoked.  Performed at [facility], [address] [place], [place] [address]   MRSA PCR Screening     Status: None   Collection Time: 07/01/20  2:17 AM   Specimen: Nasopharyngeal  Result Value Ref Range Status   MRSA by PCR NEGATIVE NEGATIVE Final    Comment:        The GeneXpert MRSA Assay (FDA approved for NASAL specimens only), is one component of a comprehensive MRSA colonization surveillance program. It is not intended to diagnose MRSA infection nor to guide or monitor treatment for MRSA infections. Performed at [facility], [address] [place], [place] [address]      Radiology Studies: No results found.   LOS: 2 days   Lanae Boast, MD [facility]  07/03/2020, 9:50 AM

## 2020-07-03 NOTE — Telephone Encounter (Signed)
Hospital follow up in 2 weeks - needs to move appt.

## 2020-07-03 NOTE — Plan of Care (Signed)

## 2020-07-03 NOTE — Consult Note (Signed)
Palliative Care Consultation Note  87 yo with progressive dementia, post-COVID ILD and functional status decline, admitted with hypoxemic respiratory failure and pulmonary HTN with worsening plueral effusion and pulmonary edema. She responded to gentle diuresis and temporary bipap in terms of relieving hypoxemia. This is her 3rd inpatient admission in 6 months.  She lives with her son and DIL. Her DIL is her primary caregiver-her husband is currently working out of the country. Since her COVID infection and home O2 requirement it has gotten increasingly difficult to care for her at home-but prior to admission she was able to ambulate to bathroom but needed increasing assistance with ADLs. Her DIL works from home and is able to help.  Her DIL tells me that she has been trying to get "palliative care services" or help at home but cannot get in touch with the patient's PCP or get a referral placed. She feels relieved that she was able to connect with palliative in the hospital.  Goals of Care: 1. Family recognize her decline and that she is very fragile, they want her to be comfortable. 2. They want her treatment focused on her symptoms-treat her lung disease to maintain her comfort. 3. Avoid re-hospitalization, have access to in home support prior to a crisis situation like this hospitalization.  Recommendations:   DNR   Given her level of frailty, goals of care, dementia and ILD/Pulmonary HTN she is very appropriate for hospice services.   Discharge Home with Hospice Services. DIL is in agreement, I have recommended that a referral be made and that she be admitted to in home hospice services the same day as her discharge.   Maximize her comfort with diuresis, oxygen and bronchodilators as indicated.  Anderson Malta, DO Palliative Medicine   Time: 50 minutes Greater than 50%  of this time was spent counseling and coordinating care related to the above assessment and plan.

## 2020-07-04 ENCOUNTER — Telehealth: Payer: Self-pay | Admitting: Family Medicine

## 2020-07-04 DIAGNOSIS — Z66 Do not resuscitate: Secondary | ICD-10-CM

## 2020-07-04 MED ORDER — FUROSEMIDE 20 MG PO TABS: ORAL_TABLET | ORAL | 2 refills | Status: AC

## 2020-07-04 NOTE — Discharge Instructions (Signed)
Information on my medicine - XARELTO (rivaroxaban)  This medication education was reviewed with me or my healthcare representative as part of my discharge preparation.  The pharmacist that spoke with me during my hospital stay was:    WHY WAS XARELTO PRESCRIBED FOR YOU? Xarelto was prescribed to treat blood clots that may have been found in the veins of your legs (deep vein thrombosis) or in your lungs (pulmonary embolism) and to reduce the risk of them occurring again.  What do you need to know about Xarelto? Continue Xarelto 20 mg tablet taken ONCE A DAY with your evening meal.  DO NOT stop taking Xarelto without talking to the health care provider who prescribed the medication.  Refill your prescription for 20 mg tablets before you run out.  After discharge, you should have regular check-up appointments with your healthcare provider that is prescribing your Xarelto.  In the future your dose may need to be changed if your kidney function changes by a significant amount.  What do you do if you miss a dose? If you are taking Xarelto TWICE DAILY and you miss a dose, take it as soon as you remember. You may take two 15 mg tablets (total 30 mg) at the same time then resume your regularly scheduled 15 mg twice daily the next day.  If you are taking Xarelto ONCE DAILY and you miss a dose, take it as soon as you remember on the same day then continue your regularly scheduled once daily regimen the next day. Do not take two doses of Xarelto at the same time.   Important Safety Information Xarelto is a blood thinner medicine that can cause bleeding. You should call your healthcare provider right away if you experience any of the following: Bleeding from an injury or your nose that does not stop. Unusual colored urine (red or dark brown) or unusual colored stools (red or black). Unusual bruising for unknown reasons. A serious fall or if you hit your head (even if there is no  bleeding).  Some medicines may interact with Xarelto and might increase your risk of bleeding while on Xarelto. To help avoid this, consult your healthcare provider or pharmacist prior to using any new prescription or non-prescription medications, including herbals, vitamins, non-steroidal anti-inflammatory drugs (NSAIDs) and supplements.  This website has more information on Xarelto: www.xarelto.com.  

## 2020-07-04 NOTE — Telephone Encounter (Signed)
yes

## 2020-07-04 NOTE — Discharge Summary (Signed)
Physician Discharge Summary  Kristin Cook [facility] PYY:[id number] DOB: 1931-11-17 DOA: 06/30/2020  PCP: [name], MD  Admit date: 06/30/2020 Discharge date: 07/04/2020  Admitted From: home Disposition:  Home w/ hospice  Recommendations for Outpatient Follow-up:  1. Follow up with PCP in 1-2 weeks Follow up with the hospice at home  Home Health:no  Equipment/Devices: none  Discharge Condition: Stable Code Status:   Code Status: DNR Diet recommendation:  Diet Order            Diet - low sodium heart healthy           DIET SOFT Room service appropriate? Yes; Fluid consistency: Thin  Diet effective now                  Brief/Interim Summary: 87 year old female dementia, IUFD likely pulmonary hypertension, subacute PE with Xarelto, COVID infection in 02/2020 presented with acute on chronic dyspnea worsening past several days per family and apparently patient collapsed in the chair while trying to walk across the room with increased cough.  Has been taking Lasix as prescribed.  EMS was summoned patient was saturating 60% on 2 L.  In the ED-placed on BiPAP, chest x-ray consistent with chronic lung disease labs showed elevated lactic acid 6 leukocytosis 12 K, glucose 317, creatinine 1.2 BNP 817.  PCCM was consulted.  Patient was admitted to ICU. Patient was managed for acute on chronic hypoxic respiratory failure in the setting of chronic lung disease with fluid overload.Patient was diuresed, oxygen was weaned down and transferred to [facility] service 4/25. Managed with IV Lasix gentle diuresis respiratory status close to baseline 2l not short of breath not in distress.  Seen by pulmonary no follow-up.  Given her overall declining condition seen by palliative care and recommended discharge home with hospice at home. She can follow-up with pulmonary outpatient..   Discharge Diagnoses:  Acute respiratory alkalosis IUP/possible pulmonary hypertension Chronic PE: Felt to be secondary to fluid  overload with chronic lung disease/iup/pULM HTN. CT shows acute on chronic airspace disease with body wall edema and right pleural effusion favoring pulmonary edema with atypical appearance due to underlying interstitial lung disease.  Managed with BiPAP initially and subsequently weaned to nasal cannula with diuresis.  On 2 L nasal cannula.  Overall frail high risk of readmissions has had multiple hospital admission.  Seen by palliative care and plan is for home with hospice.  Per pulmonary No need for further Lasix given her poor intake and the risk of renal failure.  HTN: BP controlled.Home amlodipine on hold.  Dementia: She is alert awake and interactive.  Pulmonary embolism: on Xarelto  Diabetes mellitus with uncontrolled hyperglycemia:hba1c 8.0 in march.  Well-controlled currently continue sliding scale insulin.Metformin discontinued due to lactic acidosis. Last Labs          Recent Labs  Lab 07/02/20 1648 07/02/20 2120 07/02/20 2345 07/03/20 0429 07/03/20 0805  GLUCAP 72 250* 138* 127* 157*     Acute metabolic acidosis severe lactic acidosis- felt to be due to metformin: Improved to 3.6 from 6.5.  Metformin has been discontinued.  Acute kidney injury has resolved. Last Labs         Recent Labs  Lab 06/30/20 2214 07/01/20 0319 07/02/20 0248 07/03/20 0412  BUN 40* 41* 41* 34*  CREATININE 1.29* 1.22* 1.04* 0.91     Goal of care patient w/ complex chronic lung disease medical history is, is DNR.  Seen by palliative care plan is for home with hospice.  Consults:  PCCM  Palliative care  Subjective: Alert awake with baseline dementia not in distress on 2 L nasal cannula saturating well. Discharge Exam: Vitals:   07/03/20 1157 07/03/20 2026  BP: 140/73 125/69  Pulse: 83 (!) 108  Resp: 15 16  Temp: 97.6 F (36.4 C) 98.6 F (37 C)  SpO2: 100% 100%   General: Pt is alert, awake, not in acute distress Cardiovascular: RRR, S1/S2 +, no rubs, no  gallops Respiratory: CTA bilaterally, no wheezing, no rhonchi Abdominal: Soft, NT, ND, bowel sounds + Extremities: no edema, no cyanosis  Discharge Instructions  Discharge Instructions    Diet - low sodium heart healthy   Complete by: As directed    Discharge instructions   Complete by: As directed    Check weight daily. Restrict fluid intake to 1200 ml daily and salt intake to <2 g mdaily. Call MD:  Anytime you have any of the following symptoms: 1) 3 pound weight gain in 24 hours or 5 pounds in 1 week 2) shortness of breath, with or without a dry hacking cough 3) swelling in the hands, feet or stomach 4) if you have to sleep on extra pillows at night in order to breathe   Please call call MD or return to ER for similar or worsening recurring problem that brought you to hospital or if any fever,nausea/vomiting,abdominal pain, uncontrolled pain, chest pain,  shortness of breath or any other alarming symptoms.  Please follow-up your doctor as instructed in a week time and call the office for appointment.  Please avoid alcohol, smoking, or any other illicit substance and maintain healthy habits including taking your regular medications as prescribed.  You were cared for by a hospitalist during your hospital stay. If you have any questions about your discharge medications or the care you received while you were in the hospital after you are discharged, you can call the unit and ask to speak with the hospitalist on call if the hospitalist that took care of you is not available.  Once you are discharged, your primary care physician will handle any further medical issues. Please note that NO REFILLS for any discharge medications will be authorized once you are discharged, as it is imperative that you return to your primary care physician (or establish a relationship with a primary care physician if you do not have one) for your aftercare needs so that they can reassess your need for medications and  monitor your lab values   Increase activity slowly   Complete by: As directed      Allergies as of 07/04/2020      Reactions   Penicillins Swelling   Sulfa Antibiotics Swelling   Weak    Sulfamethoxazole Other (See Comments)   Feels like her legs are weighted down      Medication List    STOP taking these medications   acetaminophen 325 MG tablet Commonly known as: TYLENOL   amLODipine 10 MG tablet Commonly known as: NORVASC   pioglitazone-metformin 15-850 MG tablet Commonly known as: ACTOPLUS MET     TAKE these medications   furosemide 20 MG tablet Commonly known as: Lasix As needed for fluid retention leg edema: 3 pound weight gain in 24 hours or 5 pounds in 1 week 2) shortness of breath, with or without a dry hacking cough 3) swelling in the hands, feet or stomach 4) if you have to sleep on extra pillows at night in order to breathe What changed: additional instructions  OneTouch Ultra test strip Generic drug: glucose blood USE AS DIRECTED   Xarelto 20 MG Tabs tablet Generic drug: rivaroxaban TAKE 1 TABLET (20 MG TOTAL) BY MOUTH DAILY WITH SUPPER. What changed: Another medication with the same name was removed. Continue taking this medication, and follow the directions you see here.       Follow-up Information    Spero Geralds, MD. Call.   Specialty: Pulmonary Disease Contact information: [address] [place] [address] [phone]              Allergies  Allergen Reactions  . Penicillins Swelling  . Sulfa Antibiotics Swelling    Weak   . Sulfamethoxazole Other (See Comments)    Feels like her legs are weighted down    The results of significant diagnostics from this hospitalization (including imaging, microbiology, ancillary and laboratory) are listed below for reference.    Microbiology: Recent Results (from the past 240 hour(s))  Culture, blood (routine x 2)     Status: None (Preliminary result)   Collection Time: 06/30/20  10:39 PM   Specimen: BLOOD  Result Value Ref Range Status   Specimen Description BLOOD SITE NOT SPECIFIED  Final   Special Requests   Final    BOTTLES DRAWN AEROBIC AND ANAEROBIC Blood Culture adequate volume   Culture   Final    NO GROWTH 4 DAYS Performed at [facility], [address] [place], [place] [address]    Report Status PENDING  Incomplete  Culture, blood (routine x 2)     Status: None (Preliminary result)   Collection Time: 06/30/20 10:42 PM   Specimen: BLOOD  Result Value Ref Range Status   Specimen Description BLOOD SITE NOT SPECIFIED  Final   Special Requests   Final    BOTTLES DRAWN AEROBIC AND ANAEROBIC Blood Culture adequate volume   Culture   Final    NO GROWTH 4 DAYS Performed at [facility], [address], [place], [place] [address]    Report Status PENDING  Incomplete  Resp Panel by RT-PCR (Flu A&B, Covid) Nasopharyngeal Swab     Status: None   Collection Time: 06/30/20 11:45 PM   Specimen: Nasopharyngeal Swab; Nasopharyngeal(NP) swabs in vial transport medium  Result Value Ref Range Status   SARS Coronavirus 2 by RT PCR NEGATIVE NEGATIVE Final    Comment: (NOTE) SARS-CoV-2 target nucleic acids are NOT DETECTED.  The SARS-CoV-2 RNA is generally detectable in upper respiratory specimens during the acute phase of infection. The lowest concentration of SARS-CoV-2 viral copies this assay can detect is 138 copies/mL. A negative result does not preclude SARS-Cov-2 infection and should not be used as the sole basis for treatment or other patient management decisions. A negative result may occur with  improper specimen collection/handling, submission of specimen other than nasopharyngeal swab, presence of viral mutation(s) within the areas targeted by this assay, and inadequate number of viral copies(<138 copies/mL). A negative result must be combined with clinical observations, patient history, and epidemiological information. The expected  result is Negative.  Fact Sheet for Patients:  EntrepreneurPulse.com.au  Fact Sheet for Healthcare Providers:  IncredibleEmployment.be  This test is no t yet approved or cleared by the [place] FDA and  has been authorized for detection and/or diagnosis of SARS-CoV-2 by FDA under an Emergency Use Authorization (EUA). This EUA will remain  in effect (meaning this test can be used) for the duration of the COVID-19 declaration under Section 564(b)(1) of the  Act, 21 U.S.C.section 360bbb-3(b)(1), unless the authorization is terminated  or revoked sooner.       Influenza A by PCR NEGATIVE NEGATIVE Final   Influenza B by PCR NEGATIVE NEGATIVE Final    Comment: (NOTE) The Xpert Xpress SARS-CoV-2/FLU/RSV plus assay is intended as an aid in the diagnosis of influenza from Nasopharyngeal swab specimens and should not be used as a sole basis for treatment. Nasal washings and aspirates are unacceptable for Xpert Xpress SARS-CoV-2/FLU/RSV testing.  Fact Sheet for Patients: EntrepreneurPulse.com.au  Fact Sheet for Healthcare Providers: IncredibleEmployment.be  This test is not yet approved or cleared by the [place] FDA and has been authorized for detection and/or diagnosis of SARS-CoV-2 by FDA under an Emergency Use Authorization (EUA). This EUA will remain in effect (meaning this test can be used) for the duration of the COVID-19 declaration under Section 564(b)(1) of the Act, 21 U.S.C. section 360bbb-3(b)(1), unless the authorization is terminated or revoked.  Performed at [facility], [place] [address] [place], [place] [address]   MRSA PCR Screening     Status: None   Collection Time: 07/01/20  2:17 AM   Specimen: Nasopharyngeal  Result Value Ref Range Status   MRSA by PCR NEGATIVE NEGATIVE Final    Comment:        The GeneXpert MRSA Assay (FDA approved for NASAL specimens only), is one  component of a comprehensive MRSA colonization surveillance program. It is not intended to diagnose MRSA infection nor to guide or monitor treatment for MRSA infections. Performed at [facility], [place] [address], [place], [place] [address]     Procedures/Studies: DG Chest 2 View  Result Date: 06/21/2020 CLINICAL DATA:  Shortness of breath EXAM: CHEST - 2 VIEW COMPARISON:  05/28/2020 FINDINGS: Severe diffuse reticulonodular interstitial opacities throughout the lungs. There may be a component of superimposed airspace disease on today's study. Heart is borderline in size. No effusions or pneumothorax. No acute bony abnormality. IMPRESSION: Severe diffuse reticulonodular interstitial disease compatible with chronic interstitial lung disease. Concern for superimposed airspace disease which could reflect edema or infection. Electronically Signed   By: Rolm Baptise M.D.   On: 06/21/2020 10:13   CT Chest Wo Contrast  Result Date: 07/01/2020 CLINICAL DATA:  Respiratory illness and nondiagnostic chest x-ray EXAM: CT CHEST WITHOUT CONTRAST TECHNIQUE: Multidetector CT imaging of the chest was performed following the standard protocol without IV contrast. COMPARISON:  05/25/2020 FINDINGS: Cardiovascular: Cardiomegaly. No pericardial effusion. Aortic and coronary atherosclerosis. Mediastinum/Nodes: Reactive appearing mediastinal nodal prominence. Lungs/Pleura: Chronic lung disease based on prior. There is acute patchy ground-glass and consolidative opacities with a new and moderate right pleural effusion. Chronic interlobular septal thickening and fibrotic changes. No pneumothorax Upper Abdomen: Negative Musculoskeletal: No acute finding. Remote T12 compression fracture and cement augmentation. Body wall edema. IMPRESSION: Acute on chronic airspace disease with body wall edema and right pleural effusion favoring pulmonary edema with atypical appearance due to underlying interstitial lung disease. Atypical  infection or acute alveolitis should also be considered. Electronically Signed   By: Monte Fantasia M.D.   On: 07/01/2020 09:09   DG Chest Port 1 View  Result Date: 06/30/2020 CLINICAL DATA:  Respiratory distress and shortness of breath for 3 days. EXAM: PORTABLE CHEST 1 VIEW COMPARISON:  06/21/2020 FINDINGS: Heart size and pulmonary vascularity are normal for technique. Diffuse reticulonodular interstitial pattern to both lungs is similar to previous study. Changes likely represent chronic interstitial lung disease. No pleural effusions. No pneumothorax. Mediastinal contours appear  intact. Calcification of the aorta. Vertebral kyphoplasty at T12. IMPRESSION: Chronic interstitial lung disease. Electronically Signed   By: Lucienne Capers M.D.   On: 06/30/2020 22:39    Labs: BNP (last 3 results) Recent Labs    05/25/20 1508 06/30/20 2214 07/03/20 0412  BNP 332.2* 817.7* 458.5*   Basic Metabolic Panel: Recent Labs  Lab 06/30/20 2214 06/30/20 2304 07/01/20 0319 07/02/20 0248 07/03/20 0412  NA 136 136 139 140 138  K 5.6* 5.0 4.6 4.0 4.2  CL 104  --  106 104 102  CO2 13*  --  21* 24 27  GLUCOSE 317*  --  71 96 134*  BUN 40*  --  41* 41* 34*  CREATININE 1.29*  --  1.22* 1.04* 0.91  CALCIUM 8.8*  --  8.9 8.5* 8.7*  MG  --   --  1.7  --   --   PHOS  --   --  3.5  --   --    Liver Function Tests: No results for input(s): AST, ALT, ALKPHOS, BILITOT, PROT, ALBUMIN in the last 168 hours. No results for input(s): LIPASE, AMYLASE in the last 168 hours. No results for input(s): AMMONIA in the last 168 hours. CBC: Recent Labs  Lab 06/30/20 2214 06/30/20 2304 07/01/20 0319 07/03/20 0412  WBC 12.6*  --  12.9* 8.4  NEUTROABS 9.6*  --   --   --   HGB 12.6 11.6* 11.5* 11.6*  HCT 41.8 34.0* 35.7* 36.4  MCV 97.7  --  92.5 92.9  PLT 436*  --  325 354   Cardiac Enzymes: No results for input(s): CKTOTAL, CKMB, CKMBINDEX, TROPONINI in the last 168 hours. BNP: Invalid input(s):  POCBNP CBG: Recent Labs  Lab 07/03/20 0805 07/03/20 1131 07/03/20 1531 07/03/20 2025 07/03/20 2320  GLUCAP 157* 165* 332* 123* 147*   D-Dimer No results for input(s): DDIMER in the last 72 hours. Hgb A1c No results for input(s): HGBA1C in the last 72 hours. Lipid Profile No results for input(s): CHOL, HDL, LDLCALC, TRIG, CHOLHDL, LDLDIRECT in the last 72 hours. Thyroid function studies No results for input(s): TSH, T4TOTAL, T3FREE, THYROIDAB in the last 72 hours.  Invalid input(s): FREET3 Anemia work up No results for input(s): VITAMINB12, FOLATE, FERRITIN, TIBC, IRON, RETICCTPCT in the last 72 hours. Urinalysis    Component Value Date/Time   COLORURINE AMBER (A) 07/01/2020 0459   APPEARANCEUR HAZY (A) 07/01/2020 0459   LABSPEC 1.018 07/01/2020 0459   PHURINE 5.0 07/01/2020 0459   GLUCOSEU 50 (A) 07/01/2020 0459   HGBUR NEGATIVE 07/01/2020 0459   BILIRUBINUR NEGATIVE 07/01/2020 0459   BILIRUBINUR n 08/05/2012 0838   KETONESUR NEGATIVE 07/01/2020 0459   PROTEINUR 100 (A) 07/01/2020 0459   UROBILINOGEN negative 08/05/2012 0838   NITRITE NEGATIVE 07/01/2020 0459   LEUKOCYTESUR NEGATIVE 07/01/2020 0459   Sepsis Labs Invalid input(s): PROCALCITONIN,  WBC,  LACTICIDVEN Microbiology Recent Results (from the past 240 hour(s))  Culture, blood (routine x 2)     Status: None (Preliminary result)   Collection Time: 06/30/20 10:39 PM   Specimen: BLOOD  Result Value Ref Range Status   Specimen Description BLOOD SITE NOT SPECIFIED  Final   Special Requests   Final    BOTTLES DRAWN AEROBIC AND ANAEROBIC Blood Culture adequate volume   Culture   Final    NO GROWTH 4 DAYS Performed at [facility], [place] [address], [place], [place] [address]    Report Status PENDING  Incomplete  Culture, blood (  routine x 2)     Status: None (Preliminary result)   Collection Time: 06/30/20 10:42 PM   Specimen: BLOOD  Result Value Ref Range Status   Specimen Description BLOOD SITE NOT  SPECIFIED  Final   Special Requests   Final    BOTTLES DRAWN AEROBIC AND ANAEROBIC Blood Culture adequate volume   Culture   Final    NO GROWTH 4 DAYS Performed at [facility], [address] [place], [place] [address]    Report Status PENDING  Incomplete  Resp Panel by RT-PCR (Flu A&B, Covid) Nasopharyngeal Swab     Status: None   Collection Time: 06/30/20 11:45 PM   Specimen: Nasopharyngeal Swab; Nasopharyngeal(NP) swabs in vial transport medium  Result Value Ref Range Status   SARS Coronavirus 2 by RT PCR NEGATIVE NEGATIVE Final    Comment: (NOTE) SARS-CoV-2 target nucleic acids are NOT DETECTED.  The SARS-CoV-2 RNA is generally detectable in upper respiratory specimens during the acute phase of infection. The lowest concentration of SARS-CoV-2 viral copies this assay can detect is 138 copies/mL. A negative result does not preclude SARS-Cov-2 infection and should not be used as the sole basis for treatment or other patient management decisions. A negative result may occur with  improper specimen collection/handling, submission of specimen other than nasopharyngeal swab, presence of viral mutation(s) within the areas targeted by this assay, and inadequate number of viral copies(<138 copies/mL). A negative result must be combined with clinical observations, patient history, and epidemiological information. The expected result is Negative.  Fact Sheet for Patients:  EntrepreneurPulse.com.au  Fact Sheet for Healthcare Providers:  IncredibleEmployment.be  This test is no t yet approved or cleared by the [place] FDA and  has been authorized for detection and/or diagnosis of SARS-CoV-2 by FDA under an Emergency Use Authorization (EUA). This EUA will remain  in effect (meaning this test can be used) for the duration of the COVID-19 declaration under Section 564(b)(1) of the Act, 21 U.S.C.section 360bbb-3(b)(1), unless the  authorization is terminated  or revoked sooner.       Influenza A by PCR NEGATIVE NEGATIVE Final   Influenza B by PCR NEGATIVE NEGATIVE Final    Comment: (NOTE) The Xpert Xpress SARS-CoV-2/FLU/RSV plus assay is intended as an aid in the diagnosis of influenza from Nasopharyngeal swab specimens and should not be used as a sole basis for treatment. Nasal washings and aspirates are unacceptable for Xpert Xpress SARS-CoV-2/FLU/RSV testing.  Fact Sheet for Patients: EntrepreneurPulse.com.au  Fact Sheet for Healthcare Providers: IncredibleEmployment.be  This test is not yet approved or cleared by the [place] FDA and has been authorized for detection and/or diagnosis of SARS-CoV-2 by FDA under an Emergency Use Authorization (EUA). This EUA will remain in effect (meaning this test can be used) for the duration of the COVID-19 declaration under Section 564(b)(1) of the Act, 21 U.S.C. section 360bbb-3(b)(1), unless the authorization is terminated or revoked.  Performed at [facility], [place] [address], [place], [place] [address]   MRSA PCR Screening     Status: None   Collection Time: 07/01/20  2:17 AM   Specimen: Nasopharyngeal  Result Value Ref Range Status   MRSA by PCR NEGATIVE NEGATIVE Final    Comment:        The GeneXpert MRSA Assay (FDA approved for NASAL specimens only), is one component of a comprehensive MRSA colonization surveillance program. It is not intended to diagnose MRSA infection nor to guide or monitor treatment for MRSA  infections. Performed at [facility], [place] [address], [place], [place] [address]   Time coordinating discharge: 35 minutes  SIGNED: Antonieta Pert, MD  [facility] 07/04/2020, 11:24 AM  If 7PM-7AM, please contact night-coverage [email or web address]

## 2020-07-04 NOTE — Telephone Encounter (Signed)
Kendal Hymen from [facility] called and she states that pt wants to know if you will continue to be her dr while she is home with hospice,  She can be reached at [phone]

## 2020-07-04 NOTE — Telephone Encounter (Signed)
Called and informed Kristin Cook

## 2020-07-04 NOTE — Consult Note (Signed)
   [facility] CM Inpatient Consult   07/04/2020  Celia Friedland [facility] 24-Mar-1931 [id number]  [facility]  Accountable Care Organization [ACO] Patient: [employer]   Patient screened for transition of care needs for post hospitalization with noted high risk score for unplanned readmission risk.  Patient was assess for potential [facility] service needs for post hospital transition.  Review of patient's medical record reveals patient is currently going home with hospice with [facility].  Plan:  Patient needs will be met with home with Hospice.  Will sign off at transition.   For questions contact:   Natividad Brood, RN BSN [facility] Liaison  [phone] business mobile phone Toll free office [phone]  Fax number: [phone] [email or web address]

## 2020-07-04 NOTE — Progress Notes (Signed)
NAME:  Kristin Cook, MRN:  [id number], DOB:  1931/10/08, LOS: 3 ADMISSION DATE:  06/30/2020, CONSULTATION DATE:  07/04/20 REFERRING MD:  EDP, CHIEF COMPLAINT:  Shortness of breath  History of Present Illness:  Kristin Cook is a 87 y.o. F with PMH of dementia, IUP, likely pulmonary HTN, HL, subacute PE on xarelto and Covid-19 infection in 02/2020 who presents with acute on chronic dyspnea that has been worse over the last several days per family member at the bedside.  Daughter in law reports that she collapsed in the chair when trying to walk across the room with increased cough.  She has been afebrile and been taking Lasix as prescribed.  Palliative care consult was discussed at last pulmonology office visit and family would like to pursue this.   Per EMS report, pt was saturating in the 60% on 2L.  In the ED, pt was placed on bipap with improvement.  CXR consistent with chronic lung disease.  Labs showed lactic acid of 6, WBC 12k, glucose 317, creatinine 1.2, BNP 817.  PCCM consulted for admission, family confirms DNR/DNI  Pertinent  Medical History   has a past medical history of Arthritis, Cataract, Cystocele, Dementia ([place]), Diabetes mellitus, Dyslipidemia, History of colonic polyps, MRSA infection, Hyperlipidemia, Hypertension, Mental disorder, Neuropathic pain of lower extremity, OAB (overactive bladder), and Shortness of breath.   Significant Hospital Events: Including procedures, antibiotic start and stop dates in addition to other pertinent events   . 4/24 admit to PCCM, quickly transitioned to [facility] not requiring bipap . 4/25 declined working with OT . 4/26 met with palliative care  Interim History / Subjective:  No overnight issues. Had meeting with palliative care and DIL. Leaning towards home hospice.   Objective   Blood pressure 125/69, pulse (!) 108, temperature 98.6 F (37 C), temperature source Oral, resp. rate 16, height _0  (1.651 m), weight 58.8 kg, SpO2 100 %.         Intake/Output Summary (Last 24 hours) at 07/04/2020 5248 Last data filed at 07/04/2020 1859 Gross per 24 hour  Intake --  Output 1350 ml  Net -1350 ml   Filed Weights   06/30/20 2203 07/01/20 0500  Weight: 58 kg 58.8 kg    General:  Resting comfortably, no distress HEENT: on nasal cannula PULM:  No respiratory distress   Labs/imaging that I havepersonally reviewed  (right click and "Reselect all SmartList Selections" daily)  Chest 4/24 personally reviewed shows extensive bilateral fibrotic changes consistent with UIP pattern  Resolved Hospital Problem list     Assessment & Plan:   Acute on Chronic Hypoxic Respiratory Failure Interstitial Lung Disease (likely UIP), Group 2pulmonary HTN, chronic PE on Xarelto  Dementia, recurrent hospitalizations P: - agree with discussion for home hospice - she has had recurrent hospital stays and decline in function status from her ILD as well as her dementia. We have scheduled a follow up with our office should her family have any additional concerns.   Pulmonary will be available as needed.   Lenice Llamas, MD Pulmonary and [place]   Labs   CBC: Recent Labs  Lab 06/30/20 2214 06/30/20 2304 07/01/20 0319 07/03/20 0412  WBC 12.6*  --  12.9* 8.4  NEUTROABS 9.6*  --   --   --   HGB 12.6 11.6* 11.5* 11.6*  HCT 41.8 34.0* 35.7* 36.4  MCV 97.7  --  92.5 92.9  PLT 436*  --  325 354    Basic  Metabolic Panel: Recent Labs  Lab 06/30/20 2214 06/30/20 2304 07/01/20 0319 07/02/20 0248 07/03/20 0412  NA 136 136 139 140 138  K 5.6* 5.0 4.6 4.0 4.2  CL 104  --  106 104 102  CO2 13*  --  21* 24 27  GLUCOSE 317*  --  71 96 134*  BUN 40*  --  41* 41* 34*  CREATININE 1.29*  --  1.22* 1.04* 0.91  CALCIUM 8.8*  --  8.9 8.5* 8.7*  MG  --   --  1.7  --   --   PHOS  --   --  3.5  --   --    GFR: Estimated Creatinine Clearance: 39.9 mL/min (by C-G formula based on SCr of 0.91 mg/dL). Recent Labs   Lab 06/30/20 2214 06/30/20 2242 07/01/20 0319 07/03/20 0412  PROCALCITON  --   --  0.87  --   WBC 12.6*  --  12.9* 8.4  LATICACIDVEN  --  6.5* 3.6*  --     Liver Function Tests: No results for input(s): AST, ALT, ALKPHOS, BILITOT, PROT, ALBUMIN in the last 168 hours. No results for input(s): LIPASE, AMYLASE in the last 168 hours. No results for input(s): AMMONIA in the last 168 hours.  ABG    Component Value Date/Time   PHART 7.411 06/30/2020 2304   PCO2ART 28.6 (L) 06/30/2020 2304   PO2ART 79 (L) 06/30/2020 2304   HCO3 21.6 07/01/2020 0319   TCO2 19 (L) 06/30/2020 2304   ACIDBASEDEF 2.9 (H) 07/01/2020 0319   O2SAT 88.2 07/01/2020 0319     Coagulation Profile: No results for input(s): INR, PROTIME in the last 168 hours.  Cardiac Enzymes: No results for input(s): CKTOTAL, CKMB, CKMBINDEX, TROPONINI in the last 168 hours.  HbA1C: Hemoglobin A1C  Date/Time Value Ref Range Status  01/18/2020 10:09 AM 9.0 (A) 4.0 - 5.6 % Final  09/13/2019 12:50 PM 8.1 (A) 4.0 - 5.6 % Final   Hgb A1c MFr Bld  Date/Time Value Ref Range Status  05/26/2020 06:58 AM 8.0 (H) 4.8 - 5.6 % Final    Comment:    (NOTE) Pre diabetes:          5.7%-6.4%  Diabetes:              >6.4%  Glycemic control for   <7.0% adults with diabetes     CBG: Recent Labs  Lab 07/03/20 0805 07/03/20 1131 07/03/20 1531 07/03/20 2025 07/03/20 2320  GLUCAP 157* 165* 332* 123* 147*

## 2020-07-04 NOTE — TOC Initial Note (Signed)
Transition of Care [facility]) - Initial/Assessment Note    Patient Details  Name: Kristin Cook MRN: [id number] Date of Birth: [birth year]  Transition of Care [facility]) CM/SW Contact:    Joanne Chars, LCSW Phone Number: 07/04/2020, 9:46 AM  Clinical Narrative:   CSW met with pt in room, permission given to speak with son and daughter in law.  Choice document left in room.  CSW spoke with daughter in law [name], who confirms interest in home hospice.  Discussed choice and she would like [facility].  Pt currently has home O2 through [employer], no other services.  Current equipment in home: walker, cane.  Pt is vaccinated for covid and boosted.    CSW spoke with Bevely Palmer at [facility] who will reach out to family to initiate services.               Expected Discharge Plan: Home w Hospice Care Barriers to Discharge: No Barriers Identified   Patient Goals and CMS Choice Patient states their goals for this hospitalization and ongoing recovery are:: comfort CMS Medicare.gov Compare Post Acute Care list provided to:: Patient Choice offered to / list presented to : Patient  Expected Discharge Plan and Services Expected Discharge Plan: Home w Hospice Care In-house Referral: Clinical Social Work   Post Acute Care Choice: Hospice Living arrangements for the past 2 months: Single Family Home                 DME Arranged: N/A (DME through [place])         HH Arranged:  (Home Hospice) [place]: Hospice and [place] Date HH Agency Contacted: 07/04/20 Time [place]: [phone] Representative spoke with at [place]: Bevely Palmer  Prior Living Arrangements/Services Living arrangements for the past 2 months: [place] with:: Adult Children Patient language and need for interpreter reviewed:: Yes Do you feel safe going back to the place where you live?: Yes      Need for Family Participation in Patient Care: Yes (Comment) Care giver support system  in place?: Yes (comment) Current home services: Other (comment) (none) Home O2 through [place]. Criminal Activity/Legal Involvement Pertinent to Current Situation/Hospitalization: No - Comment as needed  Activities of Daily Living Home Assistive Devices/Equipment: Other (Comment) ADL Screening (condition at time of admission) Patient's cognitive ability adequate to safely complete daily activities?: No Is the patient deaf or have difficulty hearing?: No Does the patient have difficulty seeing, even when wearing glasses/contacts?: No Does the patient have difficulty concentrating, remembering, or making decisions?: Yes Patient able to express need for assistance with ADLs?: Yes Does the patient have difficulty dressing or bathing?: No Independently performs ADLs?: No Does the patient have difficulty walking or climbing stairs?: Yes Weakness of Legs: Both Weakness of Arms/Hands: Both  Permission Sought/Granted Permission sought to share information with : Family Supports Permission granted to share information with : Yes, Verbal Permission Granted  Share Information with NAME: son [name], daughter in [place] representative           Emotional Assessment Appearance:: Appears stated age Attitude/Demeanor/Rapport: Engaged Affect (typically observed): Appropriate Orientation: : Oriented to Self,Oriented to Place Alcohol / Substance Use: Not Applicable Psych Involvement: No (comment)  Admission diagnosis:  Respiratory failure ([place]) [J96.90] Hyperglycemia [R73.9] Acute respiratory failure with hypoxia ([place]) [J96.01] Patient Active Problem List   Diagnosis Date Noted  . DNR (do not resuscitate) 07/03/2020  . Palliative care status 07/03/2020  . Acute on chronic respiratory failure with  hypoxia ([place]) 07/02/2020  . Respiratory failure ([place]) 07/01/2020  . Hyperglycemia   . ILD (interstitial lung disease) ([place]) 06/20/2020  . Pulmonary embolism ([place]) 06/20/2020  . Pulmonary hypertension ([place])  06/20/2020  . Hyponatremia 05/26/2020  . Dementia ([place]) 05/26/2020  . HLD (hyperlipidemia) 05/26/2020  . Acute hypoxemic respiratory failure ([place]) 05/25/2020  . Compression fracture of lumbar spine, non-traumatic, initial encounter ([place]) 02/12/2020  . Hypoxia 02/12/2020  . COVID-19 virus infection 02/12/2020  . Mild cognitive impairment with memory loss 09/21/2018  . Onychomycosis of multiple toenails with type 2 diabetes mellitus ([place]) 10/22/2015  . Presbycusis of both ears 06/19/2015  . Severe nonproliferative diabetic retinopathy of left eye with macular edema ([place]) 12/30/2011  . Arthritis 09/29/2011  . Controlled diabetes mellitus type 2 with complications ([place]) 35/52/1747  . HTN (hypertension) 11/05/2010  . Hyperlipidemia associated with type 2 diabetes mellitus ([place]) 11/05/2010   PCP:  [name], MD Pharmacy:   [facility], [place] - [address] AT [address] [place] [address] Phone: [phone] Fax: [place], [address], [address] [place], [place] [address] Phone: [phone] Fax: [phone]  [facility] [address] [place] [address] Phone: [phone] Fax: [phone]     Social Determinants of Health (SDOH) Interventions    Readmission Risk Interventions No flowsheet data found.

## 2020-07-04 NOTE — Care Management Important Message (Signed)
Important Message  Patient Details  Name: Kristin Cook MRN: [id number] Date of Birth: 1931/09/01   Medicare Important Message Given:  Yes     [name] [place] 07/04/2020, 3:47 PM

## 2020-07-04 NOTE — Progress Notes (Addendum)
[id number] [facility]) Hospital Liaison RN note  Received request from [facility] for hospice services at home after discharge. Chart and patient information under review by Hospice physician. Hospice eligibility is confirmed.  Spoke with patient's DIL, Jasmine December to initiate education related to hospice philosophy, services, and team approach to care. Patient/family verbalized understanding of information given. Per discussion, the plan is for discharge home with her daughter in law today 4.27.22  DME needs discussed. Patient has the following equipment in the home. Home oxygen through [employer], and a walker and cane which she owns.   Address has been verified and is correct in the chart.   Please send signed and completed DNR with patient/family. Please provide symptoms at discharge as needed for ongoing symptom management.  [facility] information and contact numbers given to [employer]  Above information shared with Tammy Sours, TOC.  Please call with any hospice related questions or concerns.   Thank you for the opportunity to participate in this patient's care.   Thea Gist, Charity fundraiser, BSN [employer] [phone]

## 2020-07-04 NOTE — Plan of Care (Signed)

## 2020-07-04 NOTE — TOC Transition Note (Signed)
Transition of Care [facility]) - CM/SW Discharge Note   Patient Details  Name: LAVONE BARRIENTES MRN: [id number] Date of Birth: 1931/04/25  Transition [facility] Care [facility]) CM/SW Contact:  Lorri Frederick, LCSW Phone Number: 07/04/2020, 1:01 PM   Clinical Narrative:   Pt discharging home with [facility] home hospice, who provided DME.  [facility] to transport.      Final next level of care: Home w Hospice Care Barriers to Discharge: No Barriers Identified   Patient Goals and CMS Choice Patient states their goals for this hospitalization and ongoing recovery are:: comfort CMS Medicare.gov Compare Post Acute Care list provided to:: Patient Choice offered to / list presented to : Patient  Discharge Placement                Patient to be transferred to facility by: [facility] Name of family member notified: daughter in law, Jasmine December Patient and family notified of of transfer: 07/04/20  Discharge Plan and Services In-house Referral: Clinical Social Work   Post Acute Care Choice: Hospice          DME Arranged: N/A (DME through [employer])         HH Arranged:  (Home Hospice) HH Agency[facility] Date [facility] Agency Contacted: 07/04/20 Time HH Agency Contacted: [phone] Representative spoke with at [facility] Agency: [place]  Social Determinants of Health (SDOH) Interventions     Readmission Risk Interventions No flowsheet data found.

## 2020-07-04 NOTE — Progress Notes (Signed)
PT Cancellation Note  Patient Details Name: Kristin Cook MRN: [id number] DOB: 07-25-1931   Cancelled Treatment:    Reason Eval/Treat Not Completed: Patient declined, no reason specified  Patient refused OOB despite multiple attempts to educate on importance and role in helping her to return home. Noted plans are now for home with Hospice. Will continue efforts to keep pt mobile.    Jerolyn Center, PT Pager [phone]   Zena Amos 07/04/2020, 10:57 AM

## 2020-07-05 ENCOUNTER — Ambulatory Visit: Payer: Medicare Other | Admitting: Adult Health

## 2020-07-05 LAB — CULTURE, BLOOD (ROUTINE X 2)
Culture: NO GROWTH
Culture: NO GROWTH
Special Requests: ADEQUATE
Special Requests: ADEQUATE

## 2020-07-19 ENCOUNTER — Ambulatory Visit: Payer: Medicare Other | Admitting: Adult Health

## 2020-07-19 DIAGNOSIS — R159 Full incontinence of feces: Secondary | ICD-10-CM | POA: Insufficient documentation

## 2020-07-19 DIAGNOSIS — R194 Change in bowel habit: Secondary | ICD-10-CM | POA: Insufficient documentation

## 2020-07-19 DIAGNOSIS — R197 Diarrhea, unspecified: Secondary | ICD-10-CM | POA: Insufficient documentation

## 2020-07-21 DIAGNOSIS — I272 Pulmonary hypertension, unspecified: Secondary | ICD-10-CM | POA: Diagnosis not present

## 2020-07-21 DIAGNOSIS — R0602 Shortness of breath: Secondary | ICD-10-CM | POA: Diagnosis not present

## 2020-08-21 DIAGNOSIS — I272 Pulmonary hypertension, unspecified: Secondary | ICD-10-CM | POA: Diagnosis not present

## 2020-08-21 DIAGNOSIS — R0602 Shortness of breath: Secondary | ICD-10-CM | POA: Diagnosis not present

## 2020-11-08 DEATH — deceased

## 2022-05-09 IMAGING — MR MR LUMBAR SPINE W/O CM
4 of 5 series · 19 of 48 positions shown · non-contrast
Comparison: CT abdomen yesterday.

CLINICAL DATA: Acute presentation with back pain over the last few
days.

EXAM:
MRI LUMBAR SPINE WITHOUT CONTRAST
TECHNIQUE: Multiplanar, multisequence MR imaging of the lumbar spine was
performed. No intravenous contrast was administered.

[Series 3: T2 · sagittal · 4.0mm · 0.55mm/px · 6 of 18 slices shown (1 of 2)]
[im 1/18]
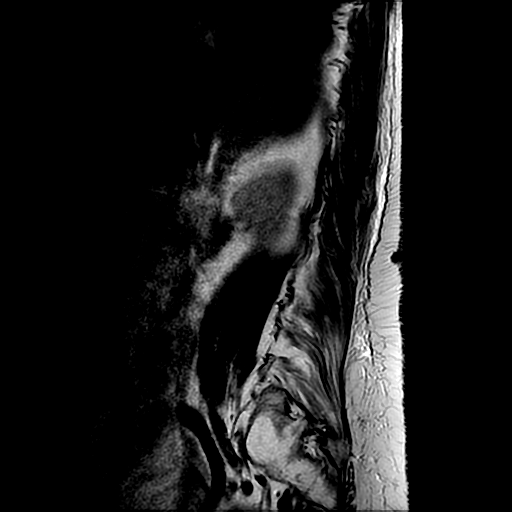
[im 4/18]
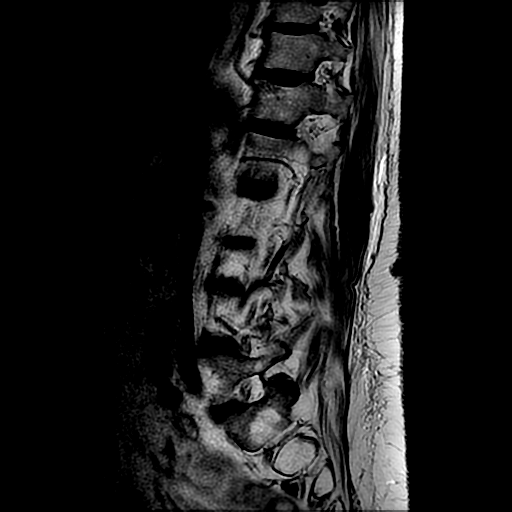
[im 7/18]
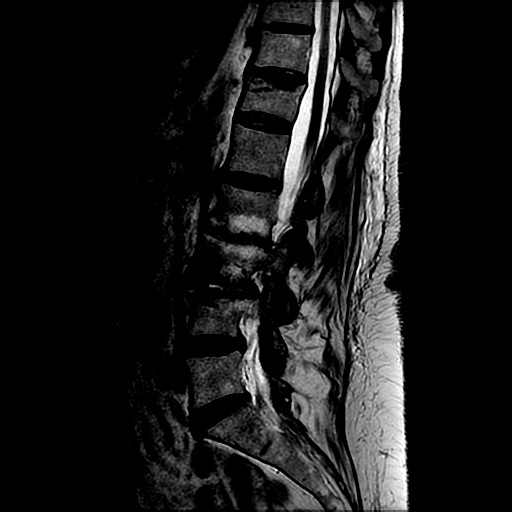
[im 11/18]
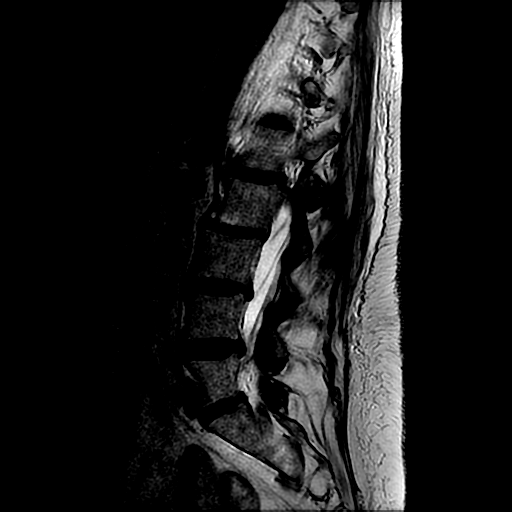
[im 14/18]
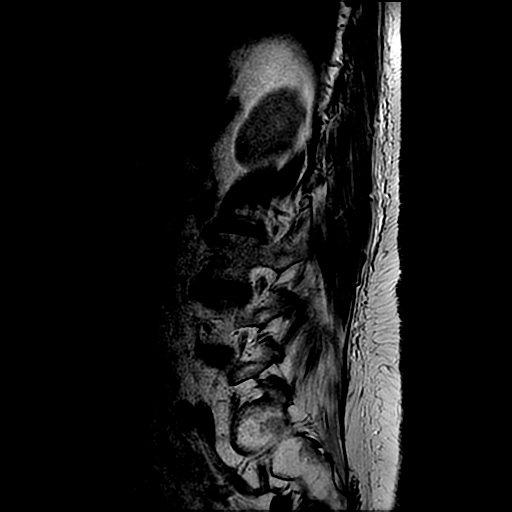
[im 18/18]
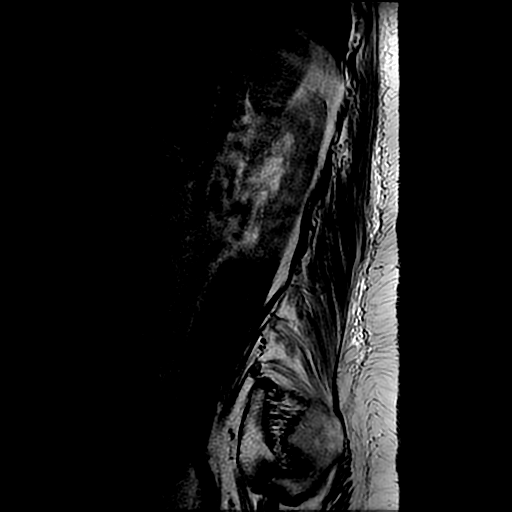

[Series 5: T1 · sagittal · 4.0mm · 0.55mm/px · 3 of 18 slices shown (1 of 2)]
[im 4/18]
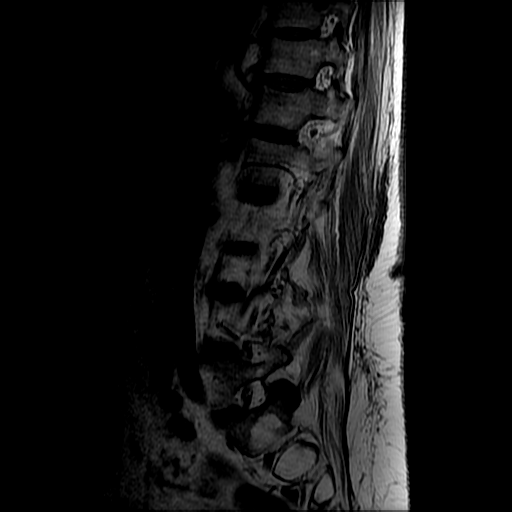
[im 11/18]
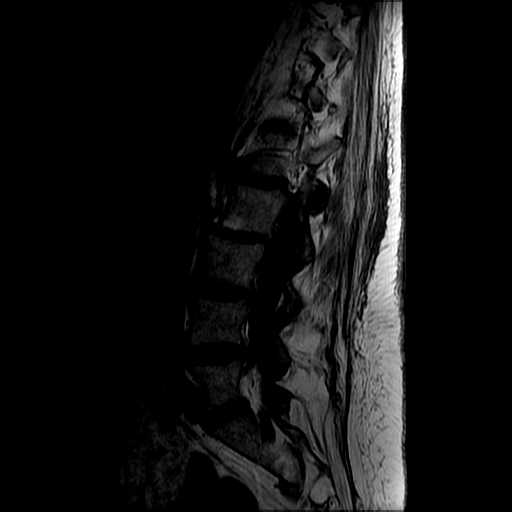
[im 18/18]
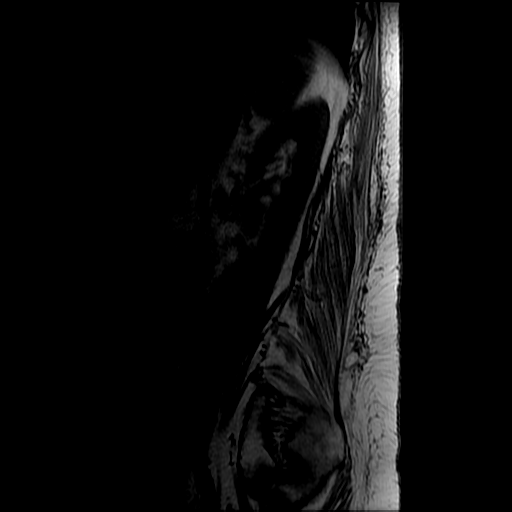

[Series 6: T2 · axial · 4.0mm · 0.39mm/px · z∈[-123,+73]mm · 7 of 47 slices shown (2 of 2)]
[im 1/47]
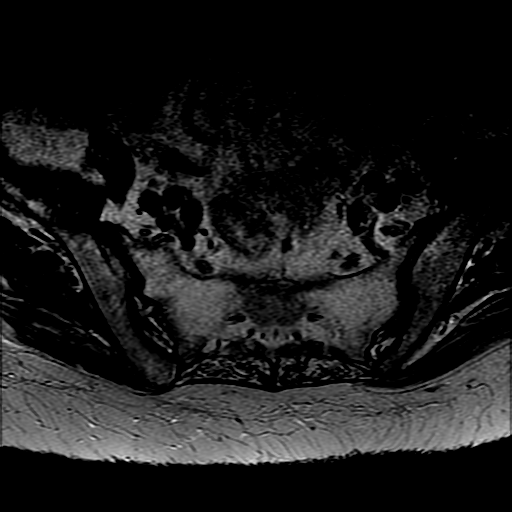
[im 7/47]
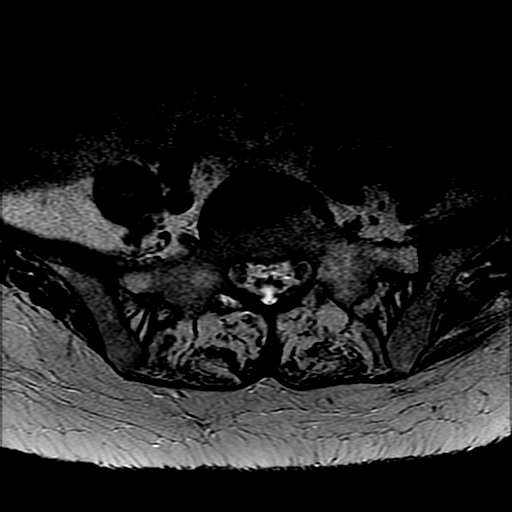
[im 14/47]
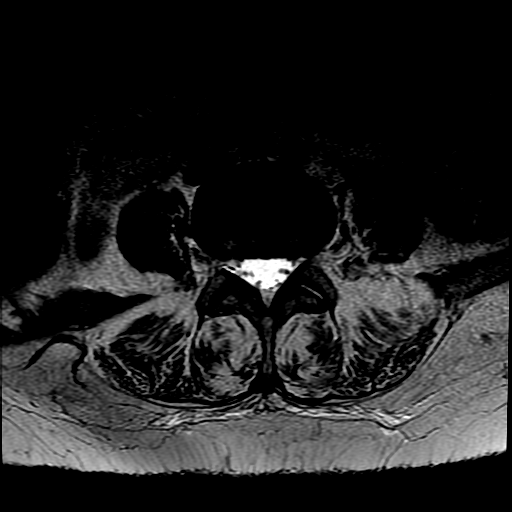
[im 20/47]
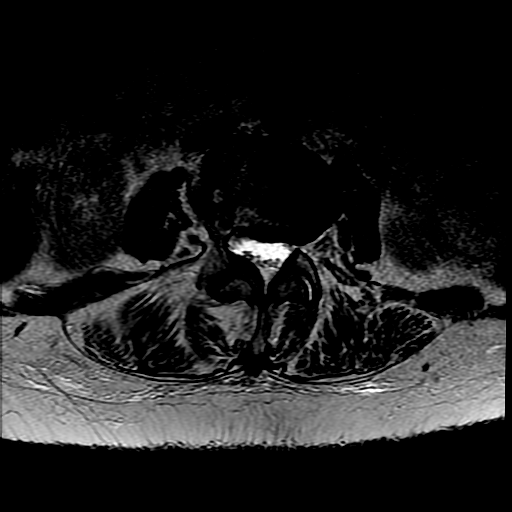
[im 24/47]
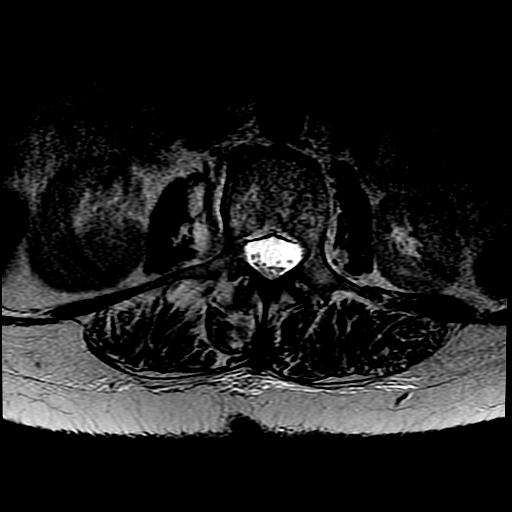
[im 27/47]
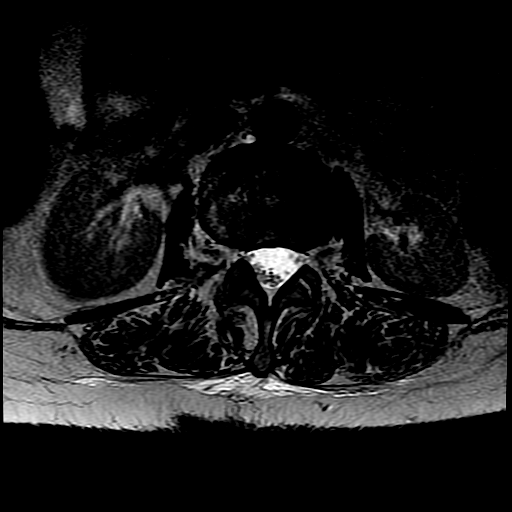
[im 40/47]
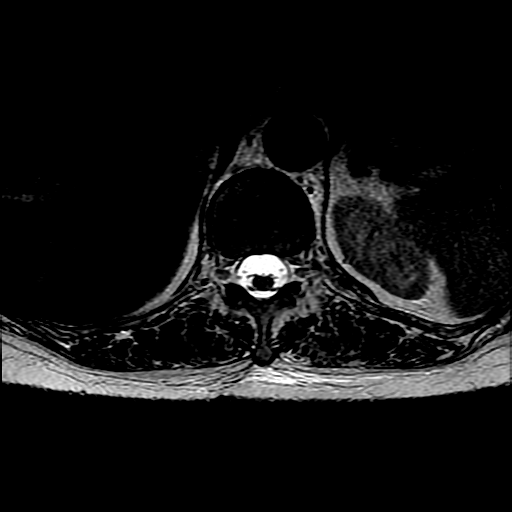

[Series 7: T1 · axial · 4.0mm · 0.39mm/px · z∈[-94,+73]mm · 3 of 47 slices shown (2 of 2)]
[im 7/47]
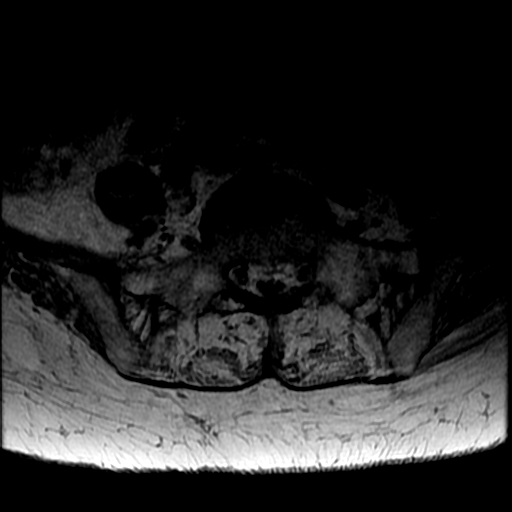
[im 24/47]
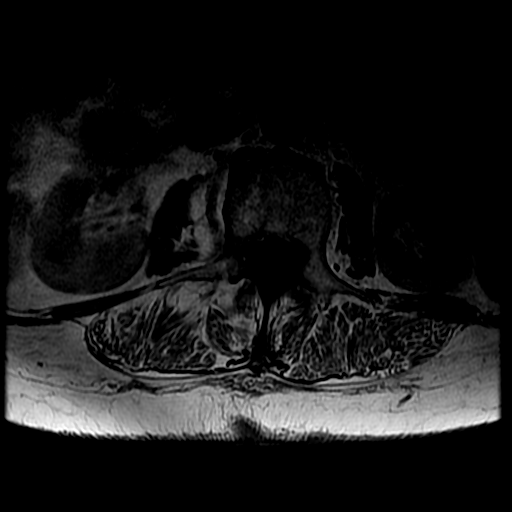
[im 40/47]
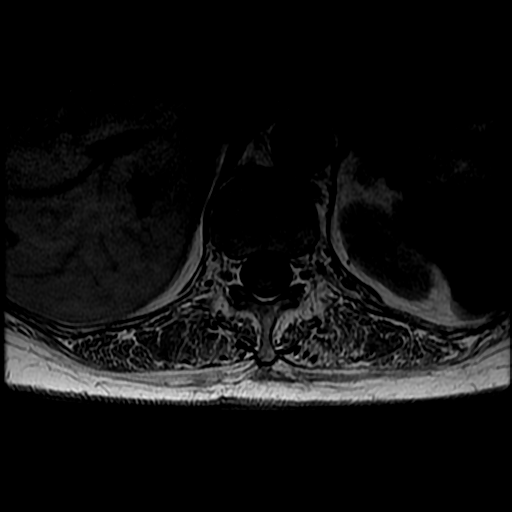

[19 of 48 positions shown; findings below may reference images not displayed]

FINDINGS: Segmentation:  5 lumbar type vertebral bodies.

Alignment: Thoracolumbar curvature convex to the right and lumbar
curvature convex to the left.

Vertebrae: Acute or subacute superior endplate fracture at T12 with
loss of height of 10%. No retropulsed bone. Discogenic degenerative
endplate changes on the right at L2-3 and L3-4.

Conus medullaris and cauda equina: Conus extends to the L1-2 level.
Conus and cauda equina appear normal.

Paraspinal and other soft tissues: Negative

Disc levels:

L1-2: Minimal disc bulge.  No stenosis or neural compression.

L2-3: Disc degeneration more pronounced on the right. Endplate
osteophytes and mild bulging of the disc. No compressive canal or
foraminal narrowing.

L3-4: Disc degeneration more pronounced on the right. Endplate
osteophytes and shallow protrusion of the disc. Facet and
ligamentous hypertrophy more prominent on the right. Mild stenosis
of the right lateral recess and intervertebral foramen on the right
that could possibly cause right-sided neural compressive symptoms.

L4-5: Mild bulging of the disc.  No stenosis.

L5-S1: Minimal bulging of the disc.  No stenosis.
IMPRESSION: 1. Acute or subacute superior endplate fracture at T12 with loss of
height of 10%. No retropulsed bone. This looks like a benign
osteoporotic fracture.
2. Thoracolumbar curvature convex to the right and lumbar curvature
convex to the left.
3. Right-sided predominant degenerative changes at L2-3 and L3-4
with right lateral recess and foraminal narrowing that could
possibly cause right-sided neural compressive symptoms at the L3-4
level.

## 2022-08-22 IMAGING — DX DG CHEST 2V
2 series · 2 of 2 positions shown · non-contrast
Comparison: May 25, 2020.

CLINICAL DATA: Pneumonia.

EXAM:
CHEST - 2 VIEW

[chest lat]
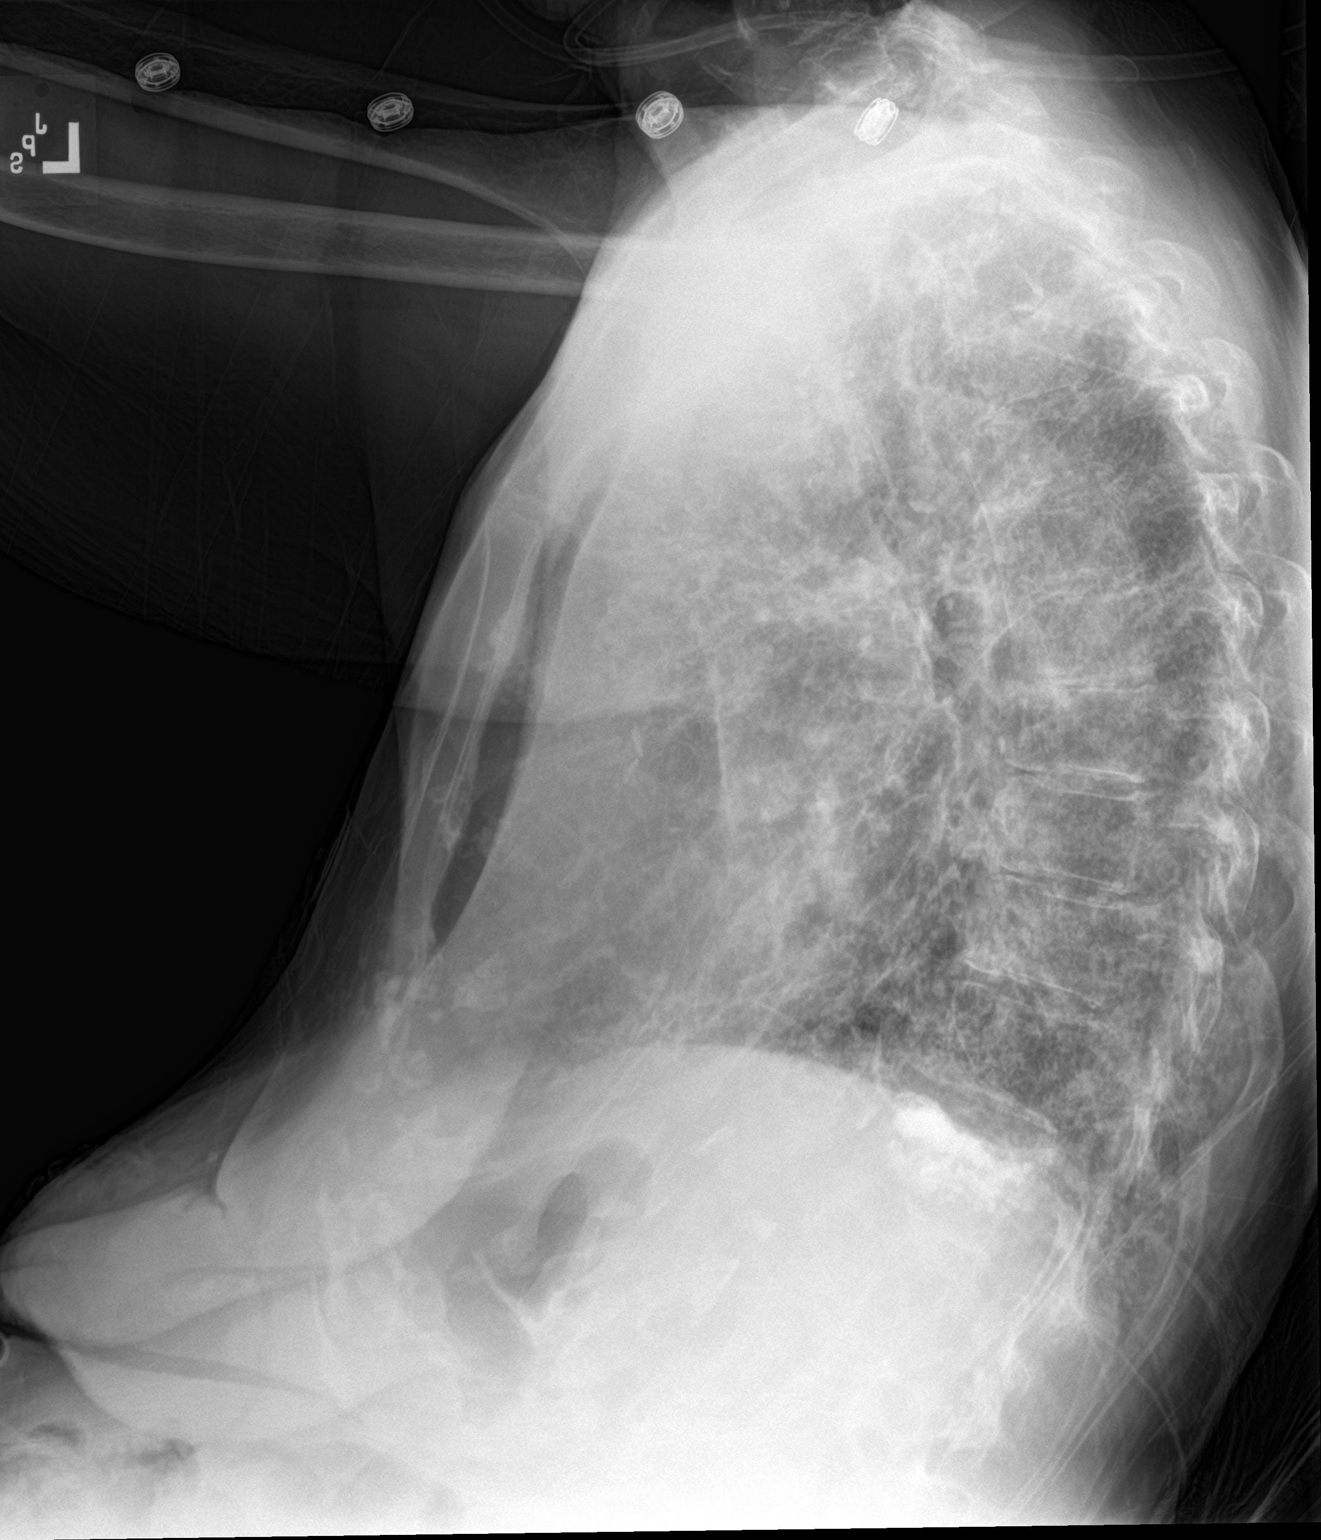

[chest ap]
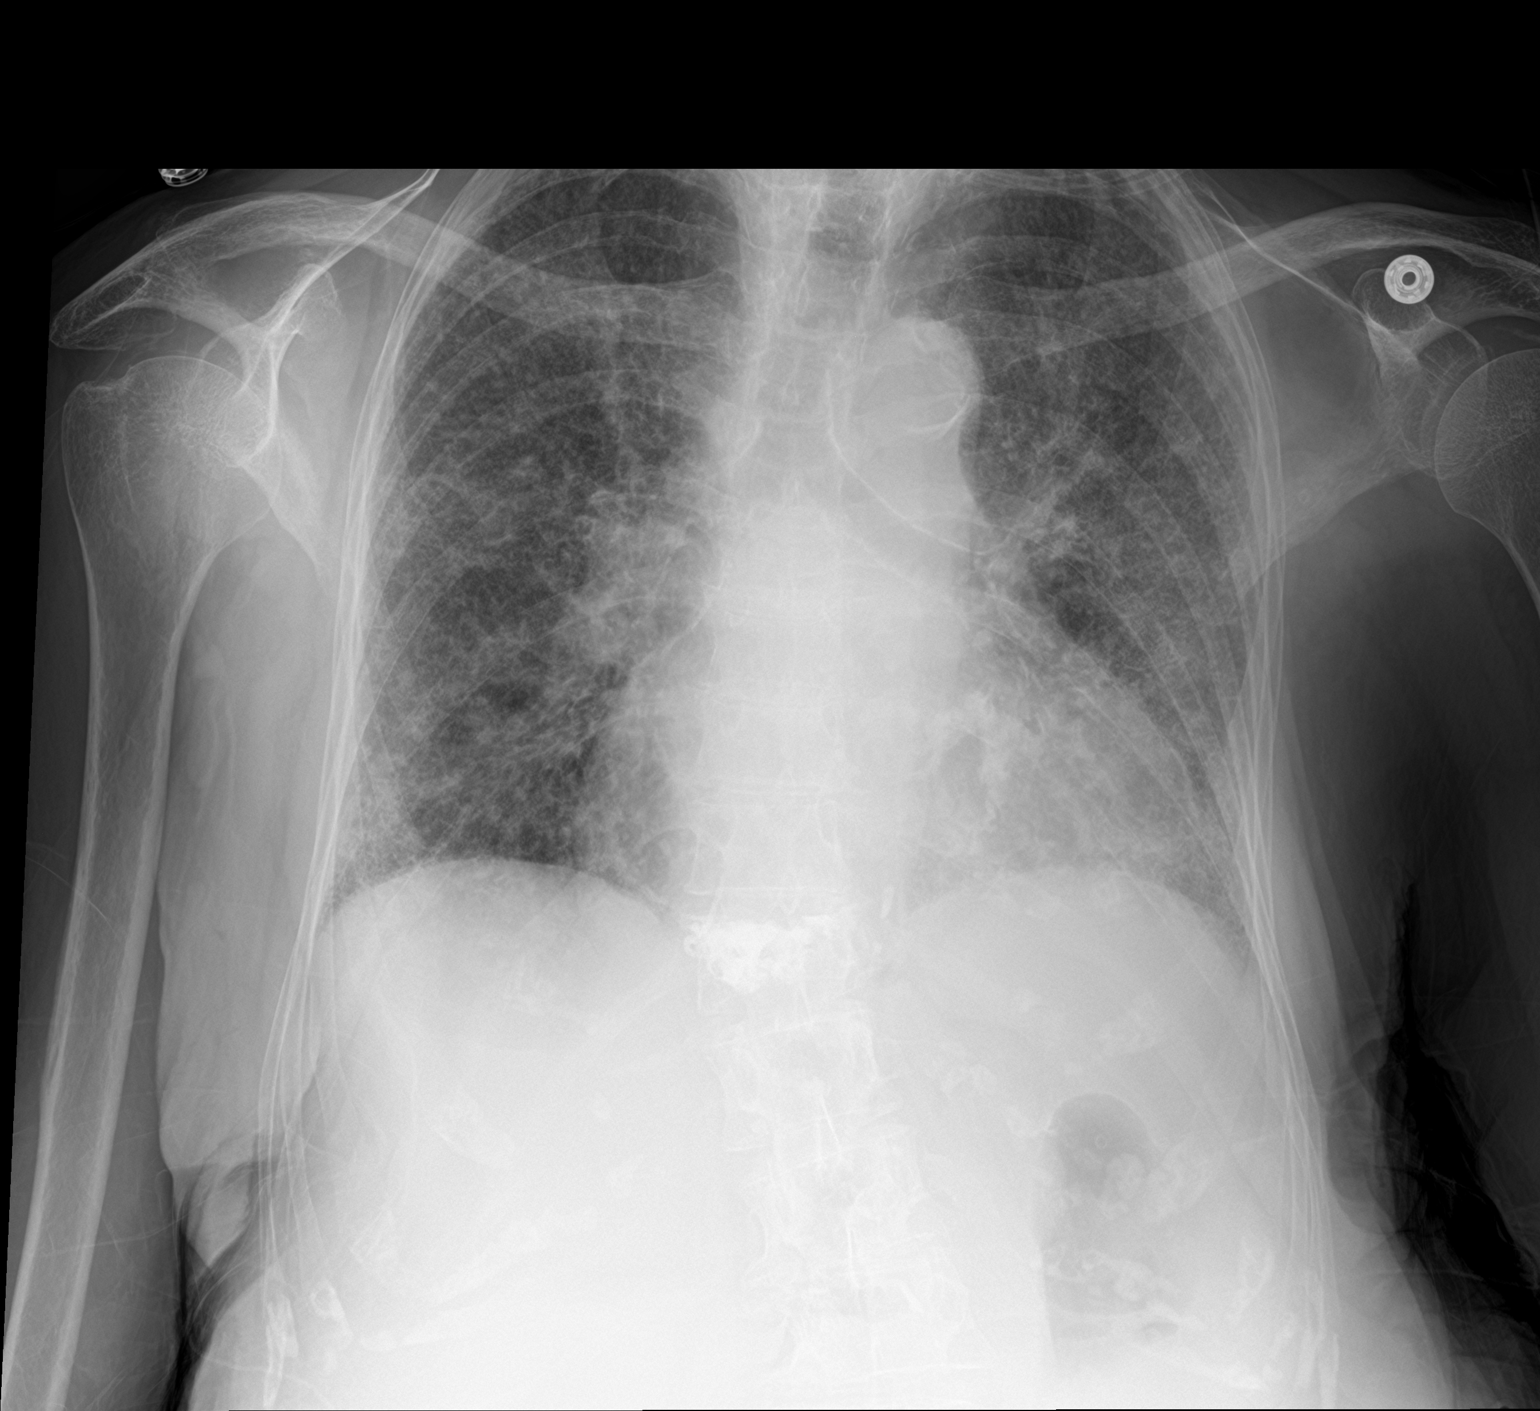

[2 of 2 positions shown; findings below may reference images not displayed]

FINDINGS: Stable cardiomegaly. No pneumothorax or pleural effusion is noted.
Stable bilateral diffuse interstitial densities are noted which may
represent chronic interstitial lung disease, although acute
superimposed atypical inflammation cannot be excluded. Bony thorax
is unremarkable.
IMPRESSION: Stable bilateral diffuse interstitial densities are noted which may
represent chronic interstitial lung disease, although acute
superimposed atypical inflammation cannot be excluded.

Aortic Atherosclerosis ([id number]).
